# Patient Record
Sex: Female | Born: 1955 | Race: Black or African American | Hispanic: No | Marital: Married | Smoking: Never smoker
Health system: Southern US, Community
[De-identification: ages and names within clinical notes are randomized; demographics above are authoritative.]

## PROBLEM LIST (undated history)

## (undated) DIAGNOSIS — I10 Essential (primary) hypertension: Secondary | ICD-10-CM

## (undated) DIAGNOSIS — Z8619 Personal history of other infectious and parasitic diseases: Secondary | ICD-10-CM

## (undated) DIAGNOSIS — N189 Chronic kidney disease, unspecified: Secondary | ICD-10-CM

## (undated) DIAGNOSIS — E669 Obesity, unspecified: Secondary | ICD-10-CM

## (undated) DIAGNOSIS — E66811 Obesity, class 1: Secondary | ICD-10-CM

## (undated) DIAGNOSIS — H409 Unspecified glaucoma: Secondary | ICD-10-CM

## (undated) DIAGNOSIS — G5600 Carpal tunnel syndrome, unspecified upper limb: Secondary | ICD-10-CM

## (undated) DIAGNOSIS — K429 Umbilical hernia without obstruction or gangrene: Secondary | ICD-10-CM

## (undated) DIAGNOSIS — M5412 Radiculopathy, cervical region: Secondary | ICD-10-CM

## (undated) HISTORY — DX: Personal history of other infectious and parasitic diseases: Z86.19

## (undated) HISTORY — DX: Obesity, class 1: E66.811

## (undated) HISTORY — DX: Unspecified glaucoma: H40.9

## (undated) HISTORY — DX: Radiculopathy, cervical region: M54.12

## (undated) HISTORY — DX: Obesity, unspecified: E66.9

## (undated) HISTORY — DX: Carpal tunnel syndrome, unspecified upper limb: G56.00

## (undated) HISTORY — PX: GLAUCOMA SURGERY: SHX656

## (undated) HISTORY — PX: EYE SURGERY: SHX253

---

## 2012-11-09 HISTORY — PX: OTHER SURGICAL HISTORY: SHX169

## 2012-11-10 LAB — VITAMIN D 25 HYDROXY (VIT D DEFICIENCY, FRACTURES): Vit D, 25-Hydroxy: 25.2

## 2012-11-10 LAB — CBC: HGB: 11.5 g/dL

## 2012-11-10 LAB — TSH: TSH: 3.26 u[IU]/mL (ref 0.41–5.90)

## 2012-11-10 LAB — LIPID PANEL: LDL (calc): 130.4

## 2012-11-10 LAB — COMPREHENSIVE METABOLIC PANEL
Creat: 1.2
Glucose: 113
Potassium: 4.3 mmol/L

## 2012-11-30 ENCOUNTER — Ambulatory Visit: Payer: Self-pay | Admitting: Internal Medicine

## 2012-12-13 ENCOUNTER — Ambulatory Visit: Payer: Self-pay | Admitting: Internal Medicine

## 2012-12-18 ENCOUNTER — Emergency Department: Payer: Self-pay | Admitting: Emergency Medicine

## 2012-12-24 ENCOUNTER — Encounter: Payer: Self-pay | Admitting: Family Medicine

## 2012-12-24 ENCOUNTER — Ambulatory Visit (INDEPENDENT_AMBULATORY_CARE_PROVIDER_SITE_OTHER): Payer: BC Managed Care – PPO | Admitting: Family Medicine

## 2012-12-24 DIAGNOSIS — S63502A Unspecified sprain of left wrist, initial encounter: Secondary | ICD-10-CM

## 2012-12-24 DIAGNOSIS — M25569 Pain in unspecified knee: Secondary | ICD-10-CM | POA: Insufficient documentation

## 2012-12-24 DIAGNOSIS — S139XXA Sprain of joints and ligaments of unspecified parts of neck, initial encounter: Secondary | ICD-10-CM

## 2012-12-24 DIAGNOSIS — S63509A Unspecified sprain of unspecified wrist, initial encounter: Secondary | ICD-10-CM

## 2012-12-24 DIAGNOSIS — S161XXA Strain of muscle, fascia and tendon at neck level, initial encounter: Secondary | ICD-10-CM

## 2012-12-24 DIAGNOSIS — M25561 Pain in right knee: Secondary | ICD-10-CM

## 2012-12-24 DIAGNOSIS — M25562 Pain in left knee: Secondary | ICD-10-CM | POA: Insufficient documentation

## 2012-12-24 NOTE — Assessment & Plan Note (Addendum)
With multiple soft tissue contusions throughout body including right lower lip contusion. Reassured today.  No need for imaging today. Supportive care, continue ibuprofen, flexeril (cut in half given sedation with whole). Continue ice. Discussed anticipated course of resolution. I will mail cervical neck strain handout with exercises (forgot to provide in office today.) Pt declines stronger pain medication currently. Recommended stop use of cervical neck collar, may continue back brace which seems to be helping. If not improving as expected with above treatment, return to see me in 1 week.
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
## 2012-12-29 ENCOUNTER — Other Ambulatory Visit (HOSPITAL_COMMUNITY): Payer: Self-pay | Admitting: Chiropractic Medicine

## 2012-12-29 DIAGNOSIS — M5000 Cervical disc disorder with myelopathy, unspecified cervical region: Secondary | ICD-10-CM

## 2013-01-04 ENCOUNTER — Ambulatory Visit (HOSPITAL_COMMUNITY)
Admission: RE | Admit: 2013-01-04 | Discharge: 2013-01-04 | Disposition: A | Discharge status: Home / Self Care | Payer: BC Managed Care – PPO | Source: Ambulatory Visit | Attending: Chiropractic Medicine | Admitting: Chiropractic Medicine

## 2013-01-04 DIAGNOSIS — M47812 Spondylosis without myelopathy or radiculopathy, cervical region: Secondary | ICD-10-CM | POA: Insufficient documentation

## 2013-01-04 DIAGNOSIS — M4802 Spinal stenosis, cervical region: Secondary | ICD-10-CM | POA: Insufficient documentation

## 2013-01-04 DIAGNOSIS — M5090 Cervical disc disorder, unspecified, unspecified cervical region: Secondary | ICD-10-CM | POA: Insufficient documentation

## 2013-01-04 DIAGNOSIS — M5000 Cervical disc disorder with myelopathy, unspecified cervical region: Secondary | ICD-10-CM

## 2013-01-04 DIAGNOSIS — M5380 Other specified dorsopathies, site unspecified: Secondary | ICD-10-CM | POA: Insufficient documentation

## 2013-01-04 DIAGNOSIS — M5020 Other cervical disc displacement, unspecified cervical region: Secondary | ICD-10-CM | POA: Insufficient documentation

## 2013-01-04 DIAGNOSIS — M79609 Pain in unspecified limb: Secondary | ICD-10-CM | POA: Insufficient documentation

## 2013-01-06 ENCOUNTER — Encounter: Payer: Self-pay | Admitting: Family Medicine

## 2013-01-10 ENCOUNTER — Encounter: Payer: Self-pay | Admitting: Family Medicine

## 2013-01-12 ENCOUNTER — Encounter: Payer: Self-pay | Admitting: Family Medicine

## 2013-03-03 ENCOUNTER — Encounter: Payer: Self-pay | Admitting: Family Medicine

## 2013-03-12 HISTORY — PX: COLONOSCOPY: SHX174

## 2013-04-04 ENCOUNTER — Ambulatory Visit: Payer: Self-pay | Admitting: Unknown Physician Specialty

## 2013-04-04 LAB — HM COLONOSCOPY

## 2013-04-06 LAB — PATHOLOGY REPORT

## 2013-04-13 ENCOUNTER — Other Ambulatory Visit: Payer: Self-pay | Admitting: Orthopedic Surgery

## 2013-05-10 ENCOUNTER — Encounter (HOSPITAL_COMMUNITY): Payer: Self-pay

## 2013-05-17 ENCOUNTER — Encounter (HOSPITAL_COMMUNITY)
Admission: RE | Admit: 2013-05-17 | Discharge: 2013-05-17 | Disposition: A | Discharge status: Home / Self Care | Payer: 59 | Source: Ambulatory Visit | Attending: Orthopedic Surgery | Admitting: Orthopedic Surgery

## 2013-05-17 ENCOUNTER — Telehealth: Payer: Self-pay | Admitting: *Deleted

## 2013-05-17 ENCOUNTER — Encounter (HOSPITAL_COMMUNITY): Payer: Self-pay

## 2013-05-17 DIAGNOSIS — Z01812 Encounter for preprocedural laboratory examination: Secondary | ICD-10-CM | POA: Insufficient documentation

## 2013-05-17 DIAGNOSIS — M5000 Cervical disc disorder with myelopathy, unspecified cervical region: Secondary | ICD-10-CM

## 2013-05-17 DIAGNOSIS — Z01810 Encounter for preprocedural cardiovascular examination: Secondary | ICD-10-CM | POA: Insufficient documentation

## 2013-05-17 DIAGNOSIS — Z01811 Encounter for preprocedural respiratory examination: Secondary | ICD-10-CM | POA: Insufficient documentation

## 2013-05-17 DIAGNOSIS — Z01818 Encounter for other preprocedural examination: Secondary | ICD-10-CM | POA: Insufficient documentation

## 2013-05-17 DIAGNOSIS — M5010 Cervical disc disorder with radiculopathy, unspecified cervical region: Secondary | ICD-10-CM

## 2013-05-17 HISTORY — DX: Umbilical hernia without obstruction or gangrene: K42.9

## 2013-05-17 LAB — URINALYSIS, ROUTINE W REFLEX MICROSCOPIC
BILIRUBIN URINE: NEGATIVE
Glucose, UA: NEGATIVE mg/dL
Hgb urine dipstick: NEGATIVE
Ketones, ur: NEGATIVE mg/dL
LEUKOCYTES UA: NEGATIVE
Nitrite: NEGATIVE
Protein, ur: NEGATIVE mg/dL
Specific Gravity, Urine: 1.019 (ref 1.005–1.030)
UROBILINOGEN UA: 0.2 mg/dL (ref 0.0–1.0)
pH: 6 (ref 5.0–8.0)

## 2013-05-17 LAB — CBC WITH DIFFERENTIAL/PLATELET
BASOS PCT: 1 % (ref 0–1)
Basophils Absolute: 0 10*3/uL (ref 0.0–0.1)
Eosinophils Absolute: 0.3 10*3/uL (ref 0.0–0.7)
Eosinophils Relative: 5 % (ref 0–5)
HCT: 39.2 % (ref 36.0–46.0)
HEMOGLOBIN: 12.8 g/dL (ref 12.0–15.0)
LYMPHS ABS: 2.2 10*3/uL (ref 0.7–4.0)
Lymphocytes Relative: 37 % (ref 12–46)
MCH: 30.5 pg (ref 26.0–34.0)
MCHC: 32.7 g/dL (ref 30.0–36.0)
MCV: 93.3 fL (ref 78.0–100.0)
MONOS PCT: 8 % (ref 3–12)
Monocytes Absolute: 0.5 10*3/uL (ref 0.1–1.0)
NEUTROS ABS: 3 10*3/uL (ref 1.7–7.7)
Neutrophils Relative %: 50 % (ref 43–77)
Platelets: 271 10*3/uL (ref 150–400)
RBC: 4.2 MIL/uL (ref 3.87–5.11)
RDW: 13.5 % (ref 11.5–15.5)
WBC: 6 10*3/uL (ref 4.0–10.5)

## 2013-05-17 LAB — APTT: APTT: 29 s (ref 24–37)

## 2013-05-17 LAB — COMPREHENSIVE METABOLIC PANEL
ALBUMIN: 3.7 g/dL (ref 3.5–5.2)
ALK PHOS: 74 U/L (ref 39–117)
ALT: 14 U/L (ref 0–35)
AST: 15 U/L (ref 0–37)
BUN: 11 mg/dL (ref 6–23)
CHLORIDE: 100 meq/L (ref 96–112)
CO2: 27 mEq/L (ref 19–32)
Calcium: 9.6 mg/dL (ref 8.4–10.5)
Creatinine, Ser: 0.97 mg/dL (ref 0.50–1.10)
GFR calc Af Amer: 74 mL/min — ABNORMAL LOW (ref >90)
GFR calc non Af Amer: 64 mL/min — ABNORMAL LOW (ref >90)
Glucose, Bld: 72 mg/dL (ref 70–99)
POTASSIUM: 3.5 meq/L — AB (ref 3.7–5.3)
Sodium: 138 mEq/L (ref 137–147)
TOTAL PROTEIN: 8.6 g/dL — AB (ref 6.0–8.3)
Total Bilirubin: 0.2 mg/dL — ABNORMAL LOW (ref 0.3–1.2)

## 2013-05-17 LAB — PROTIME-INR
INR: 0.96 (ref 0.00–1.49)
Prothrombin Time: 12.6 seconds (ref 11.6–15.2)

## 2013-05-17 LAB — SURGICAL PCR SCREEN
MRSA, PCR: NEGATIVE
Staphylococcus aureus: NEGATIVE

## 2013-05-17 LAB — TYPE AND SCREEN
ABO/RH(D): O POS
ANTIBODY SCREEN: NEGATIVE

## 2013-05-17 LAB — ABO/RH: ABO/RH(D): O POS

## 2013-05-17 NOTE — Telephone Encounter (Signed)
Martha Wade from [facility] called in reference to patient. She is schedule for neck surgery on 05/25/13 with Dr. Lynann Bologna however her insurance changed on 05/12/13 and it now requires her to have a referral to see him from her PCP. Martha Wade was asking if you can do an electronic referral with [facility] or will you need to see the patient in the office prior to the referral. She understands the surgery may have to be rescheduled. She is to get back with me about the referral website if you are able to do it online vs seeing the patient in the office.

## 2013-05-17 NOTE — Telephone Encounter (Signed)
Ok to do referral without office visit.  Order placed

## 2013-05-17 NOTE — Pre-Procedure Instructions (Signed)
Martha Wade  05/17/2013   Your procedure is scheduled on:  Wednesday January 14 th at 0830 AM  Report to [facility] Short Stay Main Entrance "A" at 0630 AM.  Call this number if you have problems the morning of surgery: [phone]   Remember:   Do not eat food or drink liquids after midnight.   Take these medicines the morning of surgery with A SIP OF WATER: None Stop Vitamins, Herbal medication , Fish oil, and Nsaids (Ibuprofen, Advill, Aleve,  Naproxen ) 5 days prior to surgery.    Do not wear jewelry, make-up or nail polish.  Do not wear lotions, powders, or perfumes. You may wear deodorant.  Do not shave 48 hours prior to surgery.   Do not bring valuables to the hospital.  [facility] is not responsible for any belongings or valuables.               Contacts, dentures or bridgework may not be worn into surgery.  Leave suitcase in the car. After surgery it may be brought to your room.  For patients admitted to the hospital, discharge time is determined by your  treatment team.               Patients discharged the day of surgery will not be allowed to drive home.    Special Instructions: Incentive Spirometry - Practice and bring it with you on the day of surgery. Shower using CHG 2 nights before surgery and the night before surgery.  If you shower the day of surgery use CHG.  Use special wash - you have one bottle of CHG for all showers.  You should use approximately 1/3 of the bottle for each shower.   Please read over the following fact sheets that you were given: Pain Booklet, Coughing and Deep Breathing, MRSA Information and Surgical Site Infection Prevention

## 2013-05-18 NOTE — Telephone Encounter (Signed)
[place] notified. She said after she was on the phone with [facility] for an hour yesterday, she found out this referral is going to have to be done electronically through the [facility] website. Our regular referral isn't going to work. I tried to do this online and had to set up an account which requires approval. Angela Nevin is aware of this as well. She is going to reschedule patient's surgery a wait for the referral to be approved.

## 2013-05-19 NOTE — Progress Notes (Addendum)
Anesthesia Chart Review:  Patient is a 58 year old female scheduled for C4-5, C5-6, C6-7 on 05/25/13 by Dr. Lynann Bologna.  History includes non-smoker, glaucoma, umbilical hernia, obesity (BMI 35).  There is no reported history of HTN, but BP was recorded as 182/116 on arrival at PAT, but was 162/90 when rechecked.  PCP is Dr. Ria Bush, newly established 12/2012. (She was previously being followed at [facility] by Dr. Apolonio Schneiders.)  EKG on 05/17/13 showed NSR, possible LAE, LAD, RSR primae or QR pattern in V1 suggesting RV conduction delay, LVH, poor r wave progression, cannot exclude anterior infarct.  Currently, no comparison EKGs are available in [place], [place], [facility], or [facility].  No CV symptoms documented at her PAT visit.  No reported CAD/MI/CHF or DM history.  CXR on 05/17/13 showed: 1. Mildly enlarged cardiopericardial silhouette, without edema.  2. Thoracic spondylosis.   Preoperative labs noted.  Reviewed above with anesthesiologist Dr. Tamala Julian.  BP will be rechecked on arrival and patient further evaluated by her assigned anesthesiologist.  If BP is reasonable and no new CV symptoms then it is anticipated that she can proceed as planned.  [facility] Short Stay Center/Anesthesiology Phone [phone] 05/19/2013 5:35 PM

## 2013-05-22 NOTE — Telephone Encounter (Signed)
Noted. Martha Wade what do I need to do for this to be approved? Will route paperwork received to [facility].

## 2013-05-23 NOTE — Telephone Encounter (Signed)
Martha Wade has handled it. Thanks!

## 2013-06-03 ENCOUNTER — Encounter (HOSPITAL_COMMUNITY)
Admission: RE | Admit: 2013-06-03 | Discharge: 2013-06-03 | Disposition: A | Discharge status: Home / Self Care | Payer: 59 | Source: Ambulatory Visit | Attending: Orthopedic Surgery | Admitting: Orthopedic Surgery

## 2013-06-03 ENCOUNTER — Other Ambulatory Visit: Payer: Self-pay | Admitting: Orthopedic Surgery

## 2013-06-03 ENCOUNTER — Encounter (HOSPITAL_COMMUNITY): Payer: Self-pay

## 2013-06-03 DIAGNOSIS — Z01812 Encounter for preprocedural laboratory examination: Secondary | ICD-10-CM | POA: Insufficient documentation

## 2013-06-03 DIAGNOSIS — Z01818 Encounter for other preprocedural examination: Secondary | ICD-10-CM | POA: Insufficient documentation

## 2013-06-03 LAB — COMPREHENSIVE METABOLIC PANEL
ALK PHOS: 76 U/L (ref 39–117)
ALT: 17 U/L (ref 0–35)
AST: 16 U/L (ref 0–37)
Albumin: 3.5 g/dL (ref 3.5–5.2)
BUN: 11 mg/dL (ref 6–23)
CO2: 26 meq/L (ref 19–32)
Calcium: 9.4 mg/dL (ref 8.4–10.5)
Chloride: 105 mEq/L (ref 96–112)
Creatinine, Ser: 0.96 mg/dL (ref 0.50–1.10)
GFR calc Af Amer: 75 mL/min — ABNORMAL LOW (ref >90)
GFR, EST NON AFRICAN AMERICAN: 65 mL/min — AB (ref >90)
GLUCOSE: 106 mg/dL — AB (ref 70–99)
POTASSIUM: 3.7 meq/L (ref 3.7–5.3)
SODIUM: 144 meq/L (ref 137–147)
Total Bilirubin: <0.2 mg/dL — ABNORMAL LOW (ref 0.3–1.2)
Total Protein: 8.2 g/dL (ref 6.0–8.3)

## 2013-06-03 LAB — URINALYSIS, ROUTINE W REFLEX MICROSCOPIC
BILIRUBIN URINE: NEGATIVE
Glucose, UA: NEGATIVE mg/dL
HGB URINE DIPSTICK: NEGATIVE
Ketones, ur: NEGATIVE mg/dL
Leukocytes, UA: NEGATIVE
Nitrite: NEGATIVE
PROTEIN: NEGATIVE mg/dL
SPECIFIC GRAVITY, URINE: 1.015 (ref 1.005–1.030)
Urobilinogen, UA: 0.2 mg/dL (ref 0.0–1.0)
pH: 7.5 (ref 5.0–8.0)

## 2013-06-03 LAB — CBC WITH DIFFERENTIAL/PLATELET
BASOS ABS: 0 10*3/uL (ref 0.0–0.1)
Basophils Relative: 0 % (ref 0–1)
EOS PCT: 3 % (ref 0–5)
Eosinophils Absolute: 0.2 10*3/uL (ref 0.0–0.7)
HCT: 37.8 % (ref 36.0–46.0)
Hemoglobin: 12.5 g/dL (ref 12.0–15.0)
LYMPHS ABS: 2.3 10*3/uL (ref 0.7–4.0)
LYMPHS PCT: 32 % (ref 12–46)
MCH: 30.4 pg (ref 26.0–34.0)
MCHC: 33.1 g/dL (ref 30.0–36.0)
MCV: 92 fL (ref 78.0–100.0)
Monocytes Absolute: 0.6 10*3/uL (ref 0.1–1.0)
Monocytes Relative: 8 % (ref 3–12)
NEUTROS ABS: 4.2 10*3/uL (ref 1.7–7.7)
NEUTROS PCT: 57 % (ref 43–77)
Platelets: 252 10*3/uL (ref 150–400)
RBC: 4.11 MIL/uL (ref 3.87–5.11)
RDW: 13.3 % (ref 11.5–15.5)
WBC: 7.3 10*3/uL (ref 4.0–10.5)

## 2013-06-03 LAB — TYPE AND SCREEN
ABO/RH(D): O POS
Antibody Screen: NEGATIVE

## 2013-06-03 LAB — PROTIME-INR
INR: 0.97 (ref 0.00–1.49)
PROTHROMBIN TIME: 12.7 s (ref 11.6–15.2)

## 2013-06-03 LAB — APTT: aPTT: 30 seconds (ref 24–37)

## 2013-06-03 NOTE — Pre-Procedure Instructions (Signed)
Martha Wade  06/03/2013   Your procedure is scheduled on:  Wednesday, January 28  Report to [facility] Short Stay Main Entrance A at 0900 AM.  Call this number if you have problems the morning of surgery: [phone]   Remember:   Do not eat food or drink liquids after midnight.Tuesday night   Take these medicines the morning of surgery with A SIP OF WATER: none   Do not wear jewelry, make-up or nail polish.  Do not wear lotions, powders, or perfumes. You may wear deodorant.  Do not shave 48 hours prior to surgery. Men may shave face and neck.  Do not bring valuables to the hospital.  [facility] is not responsible for any belongings or valuables.               Contacts, dentures or bridgework may not be worn into surgery.  Leave suitcase in the car. After surgery it may be brought to your room.  For patients admitted to the hospital, discharge time is determined by your                treatment team.               Special Instructions: Shower using CHG 2 nights before surgery and the night before surgery.  If you shower the day of surgery use CHG.  Use special wash - you have one bottle of CHG for all showers.  You should use approximately 1/3 of the bottle for each shower.   Please read over the following fact sheets that you were given: Pain Booklet, Coughing and Deep Breathing, Blood Transfusion Information and Surgical Site Infection Prevention

## 2013-06-06 NOTE — Progress Notes (Signed)
Anesthesia follow-up:  See my note from 05/17/13. Surgery was rescheduled for 06/08/13 for unknown reasons. Repeat labs acceptable.  [facility] Short Stay Center/Anesthesiology Phone [phone] 06/06/2013 11:10 AM

## 2013-06-07 MED ORDER — POVIDONE-IODINE 7.5 % EX SOLN
Freq: Once | CUTANEOUS | Status: DC
  Filled 2013-06-07: qty 118

## 2013-06-07 MED ORDER — CEFAZOLIN SODIUM-DEXTROSE 2-3 GM-% IV SOLR: 2.0000 g | INTRAVENOUS | Status: DC

## 2013-06-07 MED ORDER — CEFAZOLIN SODIUM-DEXTROSE 2-3 GM-% IV SOLR
2.0000 g | INTRAVENOUS | Status: AC
  Administered 2013-06-08: 2 g via INTRAVENOUS
  Filled 2013-06-07: qty 50

## 2013-06-08 ENCOUNTER — Ambulatory Visit (HOSPITAL_COMMUNITY): Payer: 59 | Admitting: Critical Care Medicine

## 2013-06-08 ENCOUNTER — Encounter (HOSPITAL_COMMUNITY)
Admission: RE | Disposition: A | Discharge status: Home / Self Care | Payer: Self-pay | Source: Ambulatory Visit | Attending: Orthopedic Surgery

## 2013-06-08 ENCOUNTER — Encounter (HOSPITAL_COMMUNITY): Payer: 59 | Admitting: Vascular Surgery

## 2013-06-08 ENCOUNTER — Ambulatory Visit (HOSPITAL_COMMUNITY): Payer: 59

## 2013-06-08 ENCOUNTER — Inpatient Hospital Stay (HOSPITAL_COMMUNITY)
Admission: RE | Admit: 2013-06-08 | Discharge: 2013-06-09 | DRG: 473 | Disposition: A | Discharge status: Home / Self Care | Payer: 59 | Source: Ambulatory Visit | Attending: Orthopedic Surgery | Admitting: Orthopedic Surgery

## 2013-06-08 ENCOUNTER — Encounter (HOSPITAL_COMMUNITY): Payer: Self-pay | Admitting: *Deleted

## 2013-06-08 DIAGNOSIS — M4712 Other spondylosis with myelopathy, cervical region: Principal | ICD-10-CM | POA: Diagnosis present

## 2013-06-08 DIAGNOSIS — Z7982 Long term (current) use of aspirin: Secondary | ICD-10-CM

## 2013-06-08 DIAGNOSIS — G549 Nerve root and plexus disorder, unspecified: Secondary | ICD-10-CM | POA: Diagnosis present

## 2013-06-08 HISTORY — PX: ANTERIOR CERVICAL DECOMP/DISCECTOMY FUSION: SHX1161

## 2013-06-08 SURGERY — ANTERIOR CERVICAL DECOMPRESSION/DISCECTOMY FUSION 3 LEVELS
Anesthesia: General | Site: Spine Cervical

## 2013-06-08 MED ORDER — SENNOSIDES-DOCUSATE SODIUM 8.6-50 MG PO TABS: 1.0000 | ORAL_TABLET | Freq: Every evening | ORAL | Status: DC | PRN

## 2013-06-08 MED ORDER — PHENOL 1.4 % MT LIQD: 1.0000 | OROMUCOSAL | Status: DC | PRN

## 2013-06-08 MED ORDER — OXYCODONE-ACETAMINOPHEN 5-325 MG PO TABS
1.0000 | ORAL_TABLET | ORAL | Status: DC | PRN
  Administered 2013-06-08 – 2013-06-09 (×3): 2 via ORAL
  Filled 2013-06-08 (×2): qty 2

## 2013-06-08 MED ORDER — DIAZEPAM 5 MG PO TABS
ORAL_TABLET | ORAL | Status: AC
  Administered 2013-06-08: 5 mg via ORAL
  Filled 2013-06-08: qty 1

## 2013-06-08 MED ORDER — MENTHOL 3 MG MT LOZG
LOZENGE | OROMUCOSAL | Status: AC
  Administered 2013-06-08: 1 via ORAL
  Filled 2013-06-08: qty 9

## 2013-06-08 MED ORDER — TRAVOPROST 0.004 % OP SOLN
1.0000 [drp] | Freq: Every day | OPHTHALMIC | Status: DC
Start: 2013-06-08 — End: 2013-06-09
  Administered 2013-06-08: 1 [drp] via OPHTHALMIC
  Filled 2013-06-08: qty 2.5

## 2013-06-08 MED ORDER — SODIUM CHLORIDE 0.9 % IJ SOLN: 3.0000 mL | INTRAMUSCULAR | Status: DC | PRN

## 2013-06-08 MED ORDER — NEOSTIGMINE METHYLSULFATE 1 MG/ML IJ SOLN
INTRAMUSCULAR | Status: DC | PRN
  Administered 2013-06-08: 5 mg via INTRAVENOUS

## 2013-06-08 MED ORDER — MIDAZOLAM HCL 5 MG/5ML IJ SOLN
INTRAMUSCULAR | Status: DC | PRN
  Administered 2013-06-08: 2 mg via INTRAVENOUS

## 2013-06-08 MED ORDER — LATANOPROST 0.005 % OP SOLN
1.0000 [drp] | Freq: Every day | OPHTHALMIC | Status: DC
  Administered 2013-06-08: 1 [drp] via OPHTHALMIC
  Filled 2013-06-08: qty 2.5

## 2013-06-08 MED ORDER — FENTANYL CITRATE 0.05 MG/ML IJ SOLN
INTRAMUSCULAR | Status: AC
  Filled 2013-06-08: qty 5

## 2013-06-08 MED ORDER — ONDANSETRON HCL 4 MG/2ML IJ SOLN
INTRAMUSCULAR | Status: AC
  Filled 2013-06-08: qty 2

## 2013-06-08 MED ORDER — LIDOCAINE HCL (CARDIAC) 20 MG/ML IV SOLN
INTRAVENOUS | Status: AC
  Filled 2013-06-08: qty 5

## 2013-06-08 MED ORDER — BUPIVACAINE-EPINEPHRINE (PF) 0.25% -1:200000 IJ SOLN
INTRAMUSCULAR | Status: AC
  Filled 2013-06-08: qty 30

## 2013-06-08 MED ORDER — GLYCOPYRROLATE 0.2 MG/ML IJ SOLN
INTRAMUSCULAR | Status: DC | PRN
  Administered 2013-06-08: .8 mg via INTRAVENOUS

## 2013-06-08 MED ORDER — ACETAMINOPHEN 160 MG/5ML PO SOLN: 325.0000 mg | ORAL | Status: DC | PRN

## 2013-06-08 MED ORDER — ACETAMINOPHEN 325 MG PO TABS: 650.0000 mg | ORAL_TABLET | ORAL | Status: DC | PRN

## 2013-06-08 MED ORDER — CEFAZOLIN SODIUM 1-5 GM-% IV SOLN
1.0000 g | Freq: Three times a day (TID) | INTRAVENOUS | Status: AC
  Administered 2013-06-08 – 2013-06-09 (×2): 1 g via INTRAVENOUS
  Filled 2013-06-08 (×2): qty 50

## 2013-06-08 MED ORDER — ZOLPIDEM TARTRATE 5 MG PO TABS
5.0000 mg | ORAL_TABLET | Freq: Every evening | ORAL | Status: DC | PRN
Start: 2013-06-08 — End: 2013-06-09

## 2013-06-08 MED ORDER — BRINZOLAMIDE 1 % OP SUSP
1.0000 [drp] | Freq: Three times a day (TID) | OPHTHALMIC | Status: DC
  Administered 2013-06-08: 1 [drp] via OPHTHALMIC
  Filled 2013-06-08: qty 10

## 2013-06-08 MED ORDER — ACETAMINOPHEN 650 MG RE SUPP
650.0000 mg | RECTAL | Status: DC | PRN
Start: 2013-06-08 — End: 2013-06-09

## 2013-06-08 MED ORDER — PROPOFOL 10 MG/ML IV BOLUS
INTRAVENOUS | Status: AC
  Filled 2013-06-08: qty 20

## 2013-06-08 MED ORDER — FLEET ENEMA 7-19 GM/118ML RE ENEM: 1.0000 | ENEMA | Freq: Once | RECTAL | Status: AC | PRN

## 2013-06-08 MED ORDER — SODIUM CHLORIDE 0.9 % IJ SOLN
3.0000 mL | Freq: Two times a day (BID) | INTRAMUSCULAR | Status: DC
  Administered 2013-06-08: 3 mL via INTRAVENOUS

## 2013-06-08 MED ORDER — SUCCINYLCHOLINE CHLORIDE 20 MG/ML IJ SOLN
INTRAMUSCULAR | Status: AC
  Filled 2013-06-08: qty 1

## 2013-06-08 MED ORDER — ROCURONIUM BROMIDE 50 MG/5ML IV SOLN
INTRAVENOUS | Status: AC
  Filled 2013-06-08: qty 1

## 2013-06-08 MED ORDER — CYANOCOBALAMIN 500 MCG PO TABS
500.0000 ug | ORAL_TABLET | Freq: Every day | ORAL | Status: DC
  Administered 2013-06-08: 500 ug via ORAL
  Filled 2013-06-08 (×2): qty 1

## 2013-06-08 MED ORDER — VITAMIN D3 25 MCG (1000 UNIT) PO TABS
1000.0000 [IU] | ORAL_TABLET | Freq: Every day | ORAL | Status: DC
  Administered 2013-06-08: 1000 [IU] via ORAL
  Filled 2013-06-08 (×2): qty 1

## 2013-06-08 MED ORDER — MIDAZOLAM HCL 2 MG/2ML IJ SOLN
INTRAMUSCULAR | Status: AC
Start: 2013-06-08 — End: 2013-06-08
  Filled 2013-06-08: qty 2

## 2013-06-08 MED ORDER — OXYCODONE HCL 5 MG/5ML PO SOLN: 5.0000 mg | Freq: Once | ORAL | Status: DC | PRN

## 2013-06-08 MED ORDER — BISACODYL 5 MG PO TBEC: 5.0000 mg | DELAYED_RELEASE_TABLET | Freq: Every day | ORAL | Status: DC | PRN

## 2013-06-08 MED ORDER — BUPIVACAINE-EPINEPHRINE 0.25% -1:200000 IJ SOLN
INTRAMUSCULAR | Status: DC | PRN
  Administered 2013-06-08: 3 mL

## 2013-06-08 MED ORDER — FENTANYL CITRATE 0.05 MG/ML IJ SOLN
INTRAMUSCULAR | Status: AC
Start: 2013-06-08 — End: 2013-06-08
  Filled 2013-06-08: qty 5

## 2013-06-08 MED ORDER — ROCURONIUM BROMIDE 100 MG/10ML IV SOLN
INTRAVENOUS | Status: DC | PRN
  Administered 2013-06-08: 50 mg via INTRAVENOUS
  Administered 2013-06-08 (×4): 10 mg via INTRAVENOUS

## 2013-06-08 MED ORDER — LACTATED RINGERS IV SOLN
INTRAVENOUS | Status: DC
Start: 2013-06-08 — End: 2013-06-08
  Administered 2013-06-08 (×3): via INTRAVENOUS

## 2013-06-08 MED ORDER — HYDROMORPHONE HCL PF 1 MG/ML IJ SOLN
INTRAMUSCULAR | Status: AC
  Administered 2013-06-08: 0.5 mg via INTRAVENOUS
  Filled 2013-06-08: qty 1

## 2013-06-08 MED ORDER — GLYCOPYRROLATE 0.2 MG/ML IJ SOLN
INTRAMUSCULAR | Status: AC
  Filled 2013-06-08: qty 4

## 2013-06-08 MED ORDER — MENTHOL 3 MG MT LOZG
1.0000 | LOZENGE | OROMUCOSAL | Status: DC | PRN
  Administered 2013-06-08: 1 via ORAL

## 2013-06-08 MED ORDER — 0.9 % SODIUM CHLORIDE (POUR BTL) OPTIME
TOPICAL | Status: DC | PRN
  Administered 2013-06-08: 1000 mL

## 2013-06-08 MED ORDER — SODIUM CHLORIDE 0.9 % IV SOLN
250.0000 mL | INTRAVENOUS | Status: DC
  Administered 2013-06-08: 20 mL via INTRAVENOUS

## 2013-06-08 MED ORDER — PHENYLEPHRINE HCL 10 MG/ML IJ SOLN
INTRAMUSCULAR | Status: DC | PRN
  Administered 2013-06-08 (×2): 80 ug via INTRAVENOUS
  Administered 2013-06-08: 120 ug via INTRAVENOUS
  Administered 2013-06-08: 40 ug via INTRAVENOUS
  Administered 2013-06-08: 80 ug via INTRAVENOUS
  Administered 2013-06-08: 120 ug via INTRAVENOUS
  Administered 2013-06-08: 40 ug via INTRAVENOUS

## 2013-06-08 MED ORDER — ROCURONIUM BROMIDE 50 MG/5ML IV SOLN
INTRAVENOUS | Status: AC
Start: 2013-06-08 — End: 2013-06-08
  Filled 2013-06-08: qty 1

## 2013-06-08 MED ORDER — HYDROMORPHONE HCL PF 1 MG/ML IJ SOLN
0.2500 mg | INTRAMUSCULAR | Status: DC | PRN
  Administered 2013-06-08 (×2): 0.5 mg via INTRAVENOUS

## 2013-06-08 MED ORDER — OXYCODONE HCL 5 MG PO TABS: 5.0000 mg | ORAL_TABLET | Freq: Once | ORAL | Status: DC | PRN

## 2013-06-08 MED ORDER — FENTANYL CITRATE 0.05 MG/ML IJ SOLN
INTRAMUSCULAR | Status: DC | PRN
  Administered 2013-06-08: 50 ug via INTRAVENOUS
  Administered 2013-06-08: 100 ug via INTRAVENOUS
  Administered 2013-06-08: 50 ug via INTRAVENOUS
  Administered 2013-06-08: 150 ug via INTRAVENOUS
  Administered 2013-06-08: 100 ug via INTRAVENOUS
  Administered 2013-06-08: 50 ug via INTRAVENOUS

## 2013-06-08 MED ORDER — DIAZEPAM 5 MG PO TABS
5.0000 mg | ORAL_TABLET | Freq: Four times a day (QID) | ORAL | Status: DC | PRN
Start: 2013-06-08 — End: 2013-06-09
  Administered 2013-06-08: 5 mg via ORAL

## 2013-06-08 MED ORDER — NEOSTIGMINE METHYLSULFATE 1 MG/ML IJ SOLN
INTRAMUSCULAR | Status: AC
  Filled 2013-06-08: qty 10

## 2013-06-08 MED ORDER — ARTIFICIAL TEARS OP OINT
TOPICAL_OINTMENT | OPHTHALMIC | Status: DC | PRN
  Administered 2013-06-08: 1 via OPHTHALMIC

## 2013-06-08 MED ORDER — PHENYLEPHRINE 40 MCG/ML (10ML) SYRINGE FOR IV PUSH (FOR BLOOD PRESSURE SUPPORT)
PREFILLED_SYRINGE | INTRAVENOUS | Status: AC
  Filled 2013-06-08: qty 10

## 2013-06-08 MED ORDER — ONDANSETRON HCL 4 MG/2ML IJ SOLN
INTRAMUSCULAR | Status: DC | PRN
  Administered 2013-06-08: 4 mg via INTRAVENOUS

## 2013-06-08 MED ORDER — ONDANSETRON HCL 4 MG/2ML IJ SOLN: 4.0000 mg | Freq: Once | INTRAMUSCULAR | Status: DC | PRN

## 2013-06-08 MED ORDER — MORPHINE SULFATE 2 MG/ML IJ SOLN: 1.0000 mg | INTRAMUSCULAR | Status: DC | PRN

## 2013-06-08 MED ORDER — THROMBIN 20000 UNITS EX SOLR
CUTANEOUS | Status: DC | PRN
  Administered 2013-06-08: 13:00:00

## 2013-06-08 MED ORDER — PROPOFOL 10 MG/ML IV BOLUS
INTRAVENOUS | Status: DC | PRN
  Administered 2013-06-08: 160 mg via INTRAVENOUS

## 2013-06-08 MED ORDER — LIDOCAINE HCL (CARDIAC) 20 MG/ML IV SOLN
INTRAVENOUS | Status: DC | PRN
  Administered 2013-06-08: 80 mg via INTRAVENOUS

## 2013-06-08 MED ORDER — ACETAMINOPHEN 325 MG PO TABS: 325.0000 mg | ORAL_TABLET | ORAL | Status: DC | PRN

## 2013-06-08 MED ORDER — ALUM & MAG HYDROXIDE-SIMETH 200-200-20 MG/5ML PO SUSP: 30.0000 mL | Freq: Four times a day (QID) | ORAL | Status: DC | PRN

## 2013-06-08 MED ORDER — ONDANSETRON HCL 4 MG/2ML IJ SOLN: 4.0000 mg | INTRAMUSCULAR | Status: DC | PRN

## 2013-06-08 MED ORDER — DOCUSATE SODIUM 100 MG PO CAPS
100.0000 mg | ORAL_CAPSULE | Freq: Two times a day (BID) | ORAL | Status: DC
  Administered 2013-06-08: 100 mg via ORAL
  Filled 2013-06-08: qty 1

## 2013-06-08 MED ORDER — OXYCODONE-ACETAMINOPHEN 5-325 MG PO TABS
ORAL_TABLET | ORAL | Status: AC
  Administered 2013-06-08: 2 via ORAL
  Filled 2013-06-08: qty 2

## 2013-06-08 SURGICAL SUPPLY — 74 items
BENZOIN TINCTURE PRP APPL 2/3 (GAUZE/BANDAGES/DRESSINGS) ×3 IMPLANT
BIT DRILL NEURO 2X3.1 SFT TUCH (MISCELLANEOUS) ×1 IMPLANT
BIT DRILL SRG 14X2.2XFLT CHK (BIT) ×1 IMPLANT
BIT DRL SRG 14X2.2XFLT CHK (BIT) ×1
BLADE SURG 15 STRL LF DISP TIS (BLADE) ×1 IMPLANT
BLADE SURG 15 STRL SS (BLADE) ×2
BLADE SURG ROTATE 9660 (MISCELLANEOUS) ×3 IMPLANT
BUR MATCHSTICK NEURO 3.0 LAGG (BURR) IMPLANT
CARTRIDGE OIL MAESTRO DRILL (MISCELLANEOUS) ×1 IMPLANT
CLOSURE WOUND 1/2 X4 (GAUZE/BANDAGES/DRESSINGS) ×1
CLOTH BEACON ORANGE TIMEOUT ST (SAFETY) ×3 IMPLANT
CORDS BIPOLAR (ELECTRODE) ×3 IMPLANT
COVER SURGICAL LIGHT HANDLE (MISCELLANEOUS) ×3 IMPLANT
CRADLE DONUT ADULT HEAD (MISCELLANEOUS) ×3 IMPLANT
DIFFUSER DRILL AIR PNEUMATIC (MISCELLANEOUS) ×3 IMPLANT
DRAIN JACKSON RD 7FR 3/32 (WOUND CARE) IMPLANT
DRAPE C-ARM 42X72 X-RAY (DRAPES) ×3 IMPLANT
DRAPE POUCH INSTRU U-SHP 10X18 (DRAPES) ×3 IMPLANT
DRAPE SURG 17X23 STRL (DRAPES) ×9 IMPLANT
DRILL BIT SKYLINE 14MM (BIT) ×2
DRILL NEURO 2X3.1 SOFT TOUCH (MISCELLANEOUS) ×3
DURAPREP 26ML APPLICATOR (WOUND CARE) ×3 IMPLANT
ELECT COATED BLADE 2.86 ST (ELECTRODE) ×3 IMPLANT
ELECT REM PT RETURN 9FT ADLT (ELECTROSURGICAL) ×3
ELECTRODE REM PT RTRN 9FT ADLT (ELECTROSURGICAL) ×1 IMPLANT
ENDOSKELETON IMPLANT MED 6M-0 (Orthopedic Implant) ×3 IMPLANT
EVACUATOR SILICONE 100CC (DRAIN) IMPLANT
GAUZE SPONGE 4X4 16PLY XRAY LF (GAUZE/BANDAGES/DRESSINGS) ×3 IMPLANT
GLOVE BIO SURGEON STRL SZ7 (GLOVE) ×3 IMPLANT
GLOVE BIO SURGEON STRL SZ8 (GLOVE) ×3 IMPLANT
GLOVE BIOGEL PI IND STRL 7.0 (GLOVE) ×2 IMPLANT
GLOVE BIOGEL PI IND STRL 8 (GLOVE) ×1 IMPLANT
GLOVE BIOGEL PI INDICATOR 7.0 (GLOVE) ×4
GLOVE BIOGEL PI INDICATOR 8 (GLOVE) ×2
GOWN STRL NON-REIN LRG LVL3 (GOWN DISPOSABLE) ×3 IMPLANT
GOWN STRL REIN XL XLG (GOWN DISPOSABLE) ×3 IMPLANT
INTERLOCK LRDTC CRVCL VBR 6MM (Bone Implant) ×2 IMPLANT
IV CATH 14GX2 1/4 (CATHETERS) ×3 IMPLANT
KIT BASIN OR (CUSTOM PROCEDURE TRAY) ×3 IMPLANT
KIT ROOM TURNOVER OR (KITS) ×3 IMPLANT
LORDOTIC CERVICAL VBR 6MM SM (Bone Implant) ×6 IMPLANT
MANIFOLD NEPTUNE II (INSTRUMENTS) ×3 IMPLANT
NEEDLE 27GAX1X1/2 (NEEDLE) ×3 IMPLANT
NEEDLE SPNL 20GX3.5 QUINCKE YW (NEEDLE) ×3 IMPLANT
NS IRRIG 1000ML POUR BTL (IV SOLUTION) ×3 IMPLANT
OIL CARTRIDGE MAESTRO DRILL (MISCELLANEOUS) ×3
PACK ORTHO CERVICAL (CUSTOM PROCEDURE TRAY) ×3 IMPLANT
PAD ARMBOARD 7.5X6 YLW CONV (MISCELLANEOUS) ×6 IMPLANT
PATTIES SURGICAL .5 X.5 (GAUZE/BANDAGES/DRESSINGS) ×3 IMPLANT
PATTIES SURGICAL .5 X1 (DISPOSABLE) ×3 IMPLANT
PIN DISTRACTION 14 (PIN) ×6 IMPLANT
PLATE SKYLINE THREE LEVEL 51MM (Plate) ×3 IMPLANT
PUTTY BONE DBX 5CC MIX (Putty) ×3 IMPLANT
SCREW VAR SELF TAP SKYLINE 14M (Screw) ×24 IMPLANT
SPONGE GAUZE 4X4 12PLY (GAUZE/BANDAGES/DRESSINGS) ×3 IMPLANT
SPONGE GAUZE 4X4 12PLY STER LF (GAUZE/BANDAGES/DRESSINGS) ×3 IMPLANT
SPONGE INTESTINAL PEANUT (DISPOSABLE) ×3 IMPLANT
SPONGE SURGIFOAM ABS GEL 100 (HEMOSTASIS) ×3 IMPLANT
STRIP CLOSURE SKIN 1/2X4 (GAUZE/BANDAGES/DRESSINGS) ×2 IMPLANT
SURGIFLO TRUKIT (HEMOSTASIS) IMPLANT
SUT MNCRL AB 4-0 PS2 18 (SUTURE) ×3 IMPLANT
SUT SILK 4 0 (SUTURE)
SUT SILK 4-0 18XBRD TIE 12 (SUTURE) IMPLANT
SUT VIC AB 2-0 CT2 18 VCP726D (SUTURE) ×3 IMPLANT
SYR BULB IRRIGATION 50ML (SYRINGE) ×3 IMPLANT
SYR CONTROL 10ML LL (SYRINGE) ×3 IMPLANT
TAPE CLOTH 4X10 WHT NS (GAUZE/BANDAGES/DRESSINGS) ×3 IMPLANT
TAPE CLOTH SURG 4X10 WHT LF (GAUZE/BANDAGES/DRESSINGS) ×3 IMPLANT
TAPE UMBILICAL COTTON 1/8X30 (MISCELLANEOUS) ×6 IMPLANT
TOWEL OR 17X24 6PK STRL BLUE (TOWEL DISPOSABLE) ×3 IMPLANT
TOWEL OR 17X26 10 PK STRL BLUE (TOWEL DISPOSABLE) ×3 IMPLANT
TRAY FOLEY CATH 16FRSI W/METER (SET/KITS/TRAYS/PACK) ×3 IMPLANT
WATER STERILE IRR 1000ML POUR (IV SOLUTION) IMPLANT
YANKAUER SUCT BULB TIP NO VENT (SUCTIONS) ×3 IMPLANT

## 2013-06-08 NOTE — H&P (Signed)
PREOPERATIVE H&P  Chief Complaint: bilateral arm pain  HPI: Martha Wade is a 58 y.o. female who presents with ongoing bilateral arm pain. MRI = varying degrees of spinal cord compression and NF compression C4-C7. Patient failed conservative care and did elect to proceed with a C4-7 ACDF.  Past Medical History  Diagnosis Date  . Glaucoma     early stages (Dr. Wallace Going and Dr. Gloriann Loan)  . CTS (carpal tunnel syndrome)     bilateral  . Cervical radiculopathy     [place]  . Umbilical hernia    Past Surgical History  Procedure Laterality Date  . Dexa  11/2012    WNL  . Colonoscopy w/ biopsies and polypectomy    . Eye surgery Left     laser   History   Social History  . Marital Status: Married    Spouse Name: N/A    Number of Children: N/A  . Years of Education: N/A   Social History Main Topics  . Smoking status: Never Smoker   . Smokeless tobacco: Never Used  . Alcohol Use: No  . Drug Use: No  . Sexual Activity: Not Currently   Other Topics Concern  . Not on file   Social History Narrative   Lives with [name]   Married   Occupation: dispatcher   Edu: college   Family History  Problem Relation Age of Onset  . Glaucoma Mother   . CAD Mother   . Diabetes Mother   . Cancer Father     unsure type  . Hypertension Brother     and sister  . Stroke Mother    Allergies  Allergen Reactions  . Aleve [Naproxen Sodium] Hives, Itching and Swelling  . Citrus Swelling   Prior to Admission medications   Medication Sig Start Date End Date Taking? Authorizing Provider  Aspirin-Caffeine (BACK & BODY EXTRA STRENGTH PO) Take 1 tablet by mouth at bedtime as needed (pain).   Yes Historical Provider, MD  bimatoprost (LUMIGAN) 0.03 % ophthalmic solution Place 1 drop into both eyes 2 (two) times daily.   Yes Historical Provider, MD  brinzolamide (AZOPT) 1 % ophthalmic suspension Place 1 drop into both eyes 3 (three) times daily.   Yes Historical Provider, MD   cholecalciferol (VITAMIN D) 1000 UNITS tablet Take 1,000 Units by mouth daily.   Yes Historical Provider, MD  Coenzyme Q10 (COQ-10) 100 MG CAPS Take 100 mg by mouth daily.   Yes Historical Provider, MD  cyanocobalamin 500 MCG tablet Take 500 mcg by mouth daily.    Yes Historical Provider, MD  cyclobenzaprine (FLEXERIL) 10 MG tablet Take 10 mg by mouth at bedtime as needed for muscle spasms.    Yes Historical Provider, MD  fish oil-omega-3 fatty acids 1000 MG capsule Take 1 g by mouth daily.    Yes Historical Provider, MD  travoprost, benzalkonium, (TRAVATAN) 0.004 % ophthalmic solution Place 1 drop into both eyes at bedtime.   Yes Historical Provider, MD     All other systems have been reviewed and were otherwise negative with the exception of those mentioned in the HPI and as above.  Physical Exam:  General: Alert, no acute distress Cardiovascular: No pedal edema Respiratory: No cyanosis, no use of accessory musculature Skin: No lesions in the area of chief complaint Neurologic: Sensation intact distally Psychiatric: Patient is competent for consent with normal mood and affect Lymphatic: No axillary or cervical lymphadenopathy   Assessment/Plan: Bilateral arm pain Plan for Procedure(s): ANTERIOR CERVICAL DECOMPRESSION/DISCECTOMY  FUSION 3 LEVELS, C4-C7   Sinclair Ship, MD 06/08/2013 8:20 AM

## 2013-06-08 NOTE — Anesthesia Preprocedure Evaluation (Addendum)
Anesthesia Evaluation  Patient identified by MRN, date of birth, ID band Patient awake    Airway Mallampati: II TM Distance: >3 FB Neck ROM: Limited    Dental  (+) Dental Advisory Given, Teeth Intact, Poor Dentition, Chipped and Missing,    Pulmonary          Cardiovascular hypertension, Rhythm:Regular     Neuro/Psych  Neuromuscular disease    GI/Hepatic   Endo/Other  Morbid obesity  Renal/GU      Musculoskeletal   Abdominal   Peds  Hematology   Anesthesia Other Findings   Reproductive/Obstetrics                         Anesthesia Physical Anesthesia Plan  ASA: II  Anesthesia Plan: General   Post-op Pain Management:    Induction: Intravenous  Airway Management Planned: Oral ETT  Additional Equipment:   Intra-op Plan:   Post-operative Plan: Extubation in OR  Informed Consent: I have reviewed the patients History and Physical, chart, labs and discussed the procedure including the risks, benefits and alternatives for the proposed anesthesia with the patient or authorized representative who has indicated his/her understanding and acceptance.   Dental advisory given  Plan Discussed with:   Anesthesia Plan Comments:         Anesthesia Quick Evaluation

## 2013-06-08 NOTE — Progress Notes (Signed)
Pt arrived to floor from PACU,slightly drowsy but oriented x 4,c/o of mild pain,earlier medicated in PACU,pt settled in bed,call light within pt's reach,v/s stable,will continue to monitor. Obasogie-Asidi, Rayetta Veith Efe

## 2013-06-08 NOTE — Anesthesia Postprocedure Evaluation (Signed)
Anesthesia Post Note  Patient: Martha Wade  Procedure(s) Performed: Procedure(s) (LRB): ANTERIOR CERVICAL DECOMPRESSION/DISCECTOMY FUSION 3 LEVELS (N/A)  Anesthesia type: General  Patient location: PACU  Post pain: Pain level controlled and Adequate analgesia  Post assessment: Post-op Vital signs reviewed, Patient's Cardiovascular Status Stable, Respiratory Function Stable, Patent Airway and Pain level controlled  Last Vitals:  Filed Vitals:   06/08/13 1745  BP: 151/80  Pulse: 87  Temp: 36.9 C  Resp: 14    Post vital signs: Reviewed and stable  Level of consciousness: awake, alert  and oriented  Complications: No apparent anesthesia complications

## 2013-06-08 NOTE — Anesthesia Procedure Notes (Signed)
Procedure Name: Intubation Date/Time: 06/08/2013 12:28 PM Performed by: Sampson Si E Pre-anesthesia Checklist: Patient identified, Emergency Drugs available, Suction available, Patient being monitored and Timeout performed Patient Re-evaluated:Patient Re-evaluated prior to inductionOxygen Delivery Method: Circle system utilized Preoxygenation: Pre-oxygenation with 100% oxygen Intubation Type: IV induction Ventilation: Mask ventilation without difficulty Laryngoscope Size: Mac and 3 Grade View: Grade I Tube type: Oral Tube size: 7.5 mm Number of attempts: 1 Airway Equipment and Method: Stylet Placement Confirmation: ETT inserted through vocal cords under direct vision,  positive ETCO2 and breath sounds checked- equal and bilateral Secured at: 22 cm Tube secured with: Tape Dental Injury: Teeth and Oropharynx as per pre-operative assessment

## 2013-06-08 NOTE — Transfer of Care (Signed)
Immediate Anesthesia Transfer of Care Note  Patient: Martha Wade  Procedure(s) Performed: Procedure(s) with comments: ANTERIOR CERVICAL DECOMPRESSION/DISCECTOMY FUSION 3 LEVELS (N/A) - Anterior cervical decompression fusion with instrumentation and allograft  Patient Location: PACU  Anesthesia Type:General  Level of Consciousness: awake  Airway & Oxygen Therapy: Patient Spontanous Breathing and Patient connected to nasal cannula oxygen  Post-op Assessment: Report given to PACU RN and Patient moving all extremities X 4  Post vital signs: Reviewed and stable  Complications: No apparent anesthesia complications

## 2013-06-08 NOTE — Preoperative (Signed)
Beta Blockers   Reason not to administer Beta Blockers:Not Applicable 

## 2013-06-09 ENCOUNTER — Encounter: Payer: Self-pay | Admitting: Family Medicine
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
## 2013-06-09 NOTE — Progress Notes (Signed)
Orthopedic Tech Progress Note Patient Details:  Martha Wade [birth year] [id number]  Ortho Devices Type of Ortho Device: [place] cervical collar Ortho Device/Splint Interventions: Application   Cammer, Theodoro Parma 06/09/2013, 9:15 AM

## 2013-06-09 NOTE — Progress Notes (Signed)
Patient reports resolution of bilateral arm pain. Slept well last night. Has been able to tolerate liquids.  BP 130/85  Pulse 89  Temp(Src) 98 F (36.7 C) (Oral)  Resp 18  Ht 5\' 5"  (1.651 m)  Wt 214 lb 15.2 oz (97.5 kg)  BMI 35.77 kg/m2  SpO2 100%  LMP 05/25/2013  Collar is fitting patient appropriately NVI Dressing in place  POD #1 after C4-6 acdf with resolution of bilateral arm pain  - d/c home today - f/u in 2 weeks - philly collar for showering - precautions discussed with patient

## 2013-06-09 NOTE — Plan of Care (Signed)
D/c instructions given to pt and pt family. No questions. V/u. Med script given to pt. Pt is stable to be d/c.

## 2013-06-23 NOTE — Discharge Summary (Signed)
Patient ID: Martha Wade MRN: [id number] DOB/AGE: 1956/03/05 58 y.o.  Admit date: 06/08/2013 Discharge date: 06/09/2012  Admission Diagnoses:  Active Problems:   Myeloradiculopathy   Discharge Diagnoses:  Same  Past Medical History  Diagnosis Date  . Glaucoma     early stages (Dr. Wallace Going and Dr. Gloriann Loan)  . CTS (carpal tunnel syndrome)     bilateral  . Cervical radiculopathy     [place]  . Umbilical hernia     Surgeries: Procedure(s): ANTERIOR CERVICAL DECOMPRESSION/DISCECTOMY FUSION 3 LEVELS on 06/08/2013   Consultants:    Discharged Condition: Improved  Hospital Course: Martha Wade is an 58 y.o. female who was admitted 06/08/2013 for operative treatment of<principal problem not specified>. Patient has severe unremitting pain that affects sleep, daily activities, and work/hobbies. After pre-op clearance the patient was taken to the operating room on 06/08/2013 and underwent  Procedure(s): ANTERIOR CERVICAL DECOMPRESSION/DISCECTOMY FUSION 3 LEVELS C4-7.    Patient was given perioperative antibiotics:  Anti-infectives   Start     Dose/Rate Route Frequency Ordered Stop   06/08/13 2130  ceFAZolin (ANCEF) IVPB 1 g/50 mL premix     1 g 100 mL/hr over 30 Minutes Intravenous Every 8 hours 06/08/13 2116 06/09/13 0632   06/08/13 0600  ceFAZolin (ANCEF) IVPB 2 g/50 mL premix  Status:  Discontinued     2 g 100 mL/hr over 30 Minutes Intravenous On call to O.R. 06/07/13 1419 06/07/13 1504   06/08/13 0600  ceFAZolin (ANCEF) IVPB 2 g/50 mL premix     2 g 100 mL/hr over 30 Minutes Intravenous On call to O.R. 06/07/13 1503 06/08/13 1231       Patient was given sequential compression devices, early ambulation to prevent DVT.  Patient benefited maximally from hospital stay and there were no complications.    Recent vital signs: BP 130/85  Pulse 89  Temp(Src) 98 F (36.7 C) (Oral)  Resp 18  Ht 5\' 5"  (1.651 m)  Wt 97.5 kg (214 lb 15.2 oz)  BMI 35.77 kg/m2  SpO2 100%  LMP  05/25/2013   Discharge Medications:     Medication List    STOP taking these medications       BACK & BODY EXTRA STRENGTH PO     cyclobenzaprine 10 MG tablet  Commonly known as:  FLEXERIL     fish oil-omega-3 fatty acids 1000 MG capsule      TAKE these medications       bimatoprost 0.03 % ophthalmic solution  Commonly known as:  LUMIGAN  Place 1 drop into both eyes 2 (two) times daily.     brinzolamide 1 % ophthalmic suspension  Commonly known as:  AZOPT  Place 1 drop into both eyes 3 (three) times daily.     cholecalciferol 1000 UNITS tablet  Commonly known as:  VITAMIN D  Take 1,000 Units by mouth daily.     CoQ-10 100 MG Caps  Take 100 mg by mouth daily.     cyanocobalamin 500 MCG tablet  Take 500 mcg by mouth daily.     travoprost (benzalkonium) 0.004 % ophthalmic solution  Commonly known as:  TRAVATAN  Place 1 drop into both eyes at bedtime.        Diagnostic Studies: Dg Cervical Spine 2-3 Views  06/08/2013   CLINICAL DATA:  Cervical fusion  EXAM: CERVICAL SPINE - 2-3 VIEW; DG C-ARM 1-60 MIN  COMPARISON:  MR 01/04/2013  FINDINGS: Intraoperative lateral fluoroscopic spot images document changes of instrumented ACDF C4-C7 with anterior  plate and screws projecting in expected location, interbody cages C4-C7. Cervicothoracic junction not well visualized.  IMPRESSION: ACDF C4-C7   Electronically Signed   By: Arne Cleveland M.D.   On: 06/08/2013 16:48   Dg C-arm 1-60 Min  06/08/2013   CLINICAL DATA:  Cervical fusion  EXAM: CERVICAL SPINE - 2-3 VIEW; DG C-ARM 1-60 MIN  COMPARISON:  MR 01/04/2013  FINDINGS: Intraoperative lateral fluoroscopic spot images document changes of instrumented ACDF C4-C7 with anterior plate and screws projecting in expected location, interbody cages C4-C7. Cervicothoracic junction not well visualized.  IMPRESSION: ACDF C4-C7   Electronically Signed   By: Arne Cleveland M.D.   On: 06/08/2013 16:48    Disposition: 01-Home or Self  Care   POD #1 after C4-7 acdf with resolution of bilateral arm pain  - d/c home today  - f/u in 2 weeks  - philly collar for showering  - precautions discussed with patient   Signed: Justice Britain 06/23/2013, 7:44 AM

## 2013-06-27 ENCOUNTER — Encounter: Payer: Self-pay | Admitting: Family Medicine

## 2013-09-02 ENCOUNTER — Telehealth: Payer: Self-pay | Admitting: Family Medicine
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
## 2013-09-05 NOTE — Telephone Encounter (Signed)
This is an order from ortho for physical therapy - recommending therapy at [facility] on [address] in [place].  This is the therapist associated with Dr. Laurena Bering office which I think is fine.   That is where ortho wanted her to go so I recommend she call # on order form to schedule.  Doesn't need referral from me. Placed referral order in Kim's box.  She has original.

## 2014-09-13 ENCOUNTER — Other Ambulatory Visit: Payer: Self-pay | Admitting: Family Medicine

## 2014-09-13 ENCOUNTER — Other Ambulatory Visit (INDEPENDENT_AMBULATORY_CARE_PROVIDER_SITE_OTHER): Payer: 59

## 2014-09-13 DIAGNOSIS — E559 Vitamin D deficiency, unspecified: Secondary | ICD-10-CM | POA: Insufficient documentation

## 2014-09-13 DIAGNOSIS — E669 Obesity, unspecified: Secondary | ICD-10-CM | POA: Diagnosis not present

## 2014-09-13 LAB — LIPID PANEL
CHOL/HDL RATIO: 4
Cholesterol: 208 mg/dL — ABNORMAL HIGH (ref 0–200)
HDL: 49.2 mg/dL (ref >39.00)
LDL Cholesterol: 134 mg/dL — ABNORMAL HIGH (ref 0–99)
NONHDL: 158.8
Triglycerides: 125 mg/dL (ref 0.0–149.0)
VLDL: 25 mg/dL (ref 0.0–40.0)

## 2014-09-13 LAB — BASIC METABOLIC PANEL
BUN: 14 mg/dL (ref 6–23)
CALCIUM: 9.4 mg/dL (ref 8.4–10.5)
CO2: 29 meq/L (ref 19–32)
Chloride: 102 mEq/L (ref 96–112)
Creatinine, Ser: 1.15 mg/dL (ref 0.40–1.20)
GFR: 62.36 mL/min (ref >60.00)
GLUCOSE: 94 mg/dL (ref 70–99)
Potassium: 3.8 mEq/L (ref 3.5–5.1)
Sodium: 136 mEq/L (ref 135–145)

## 2014-09-13 LAB — VITAMIN D 25 HYDROXY (VIT D DEFICIENCY, FRACTURES): VITD: 51.5 ng/mL (ref 30.00–100.00)

## 2014-09-18 ENCOUNTER — Ambulatory Visit (INDEPENDENT_AMBULATORY_CARE_PROVIDER_SITE_OTHER): Payer: 59 | Admitting: Family Medicine

## 2014-09-18 ENCOUNTER — Other Ambulatory Visit (HOSPITAL_COMMUNITY)
Admission: RE | Admit: 2014-09-18 | Discharge: 2014-09-18 | Disposition: A | Discharge status: Home / Self Care | Payer: 59 | Source: Ambulatory Visit | Attending: Family Medicine | Admitting: Family Medicine

## 2014-09-18 ENCOUNTER — Encounter: Payer: Self-pay | Admitting: Family Medicine

## 2014-09-18 VITALS — BP 140/94 | HR 64 | Temp 98.7°F | Ht 65.0 in | Wt 208.2 lb

## 2014-09-18 DIAGNOSIS — E669 Obesity, unspecified: Secondary | ICD-10-CM | POA: Insufficient documentation

## 2014-09-18 DIAGNOSIS — E785 Hyperlipidemia, unspecified: Secondary | ICD-10-CM | POA: Insufficient documentation

## 2014-09-18 DIAGNOSIS — R030 Elevated blood-pressure reading, without diagnosis of hypertension: Secondary | ICD-10-CM

## 2014-09-18 DIAGNOSIS — Z124 Encounter for screening for malignant neoplasm of cervix: Secondary | ICD-10-CM

## 2014-09-18 DIAGNOSIS — E66811 Obesity, class 1: Secondary | ICD-10-CM | POA: Insufficient documentation

## 2014-09-18 DIAGNOSIS — Z01419 Encounter for gynecological examination (general) (routine) without abnormal findings: Secondary | ICD-10-CM | POA: Insufficient documentation

## 2014-09-18 DIAGNOSIS — Z0000 Encounter for general adult medical examination without abnormal findings: Secondary | ICD-10-CM | POA: Diagnosis not present

## 2014-09-18 DIAGNOSIS — Z1151 Encounter for screening for human papillomavirus (HPV): Secondary | ICD-10-CM | POA: Diagnosis present

## 2014-09-18 DIAGNOSIS — M5412 Radiculopathy, cervical region: Secondary | ICD-10-CM | POA: Insufficient documentation

## 2014-09-18 DIAGNOSIS — E559 Vitamin D deficiency, unspecified: Secondary | ICD-10-CM

## 2014-09-18 DIAGNOSIS — M25512 Pain in left shoulder: Secondary | ICD-10-CM | POA: Insufficient documentation

## 2014-09-18 DIAGNOSIS — I10 Essential (primary) hypertension: Secondary | ICD-10-CM | POA: Insufficient documentation

## 2014-09-18 LAB — HM PAP SMEAR: HM Pap smear: NORMAL

## 2014-09-18 MED ORDER — FLUTICASONE PROPIONATE 50 MCG/ACT NA SUSP: 2.0000 | Freq: Every day | NASAL | Status: DC

## 2014-09-18 NOTE — Assessment & Plan Note (Signed)
Discussed healthy diet and lifestyle changes to affect weight loss.

## 2014-09-18 NOTE — Addendum Note (Signed)
Addended by: Ria Bush on: 09/18/2014 12:53 PM   Modules accepted: Orders, SmartSet

## 2014-09-18 NOTE — Assessment & Plan Note (Signed)
Mild off meds - discussed healthy diet changes to improve #s.

## 2014-09-18 NOTE — Progress Notes (Signed)
Pre visit review using our clinic review tool, if applicable. No additional management support is needed unless otherwise documented below in the visit note. 

## 2014-09-18 NOTE — Progress Notes (Addendum)
BP 140/94 mmHg  Pulse 64  Temp(Src) 98.7 F (37.1 C) (Oral)  Ht 5\' 5"  (1.651 m)  Wt 208 lb 4 oz (94.462 kg)  BMI 34.65 kg/m2  LMP 09/06/2014   CC: CPE  Subjective:    Patient ID: Martha Wade, female    DOB: 12/31/1955, 59 y.o.   MRN: [id number]  HPI: Martha Wade is a 59 y.o. female presenting on 09/18/2014 for Annual Exam   BP elevated today - fmhx HTN. Runs elevated at home as well.  BP Readings from Last 3 Encounters:  09/18/14 140/94  12/24/12 136/84   Left shoulder pain persists. Taking tylenol. Ongoing over last 2-3 weeks. H/o aleve allergy (hives, swelling).   Preventative: 11/2012 CPE normal pap smear and mammogram (s/p diagnostic mammo bilateral, rec f/u 1 year). Colon cancer screening - colonoscopy at [facility] with polyps, rec rpt 5 yrs 2014 Well woman - last done 2 yrs ago by PCP Dr Eliott Nine.  Mammogram - normal 2 yrs ago at [facility]. Due for rpt. DEXA 11/2012 - WNL Tdap 12/2012 LMP 09/06/2014. Heavy periods bleed lasts 1-2 wks. No intermenstrual spotting. No h/o fibroids.  Seat belt use discussed  G0P0 Lives with [name] Married Occupation: dispatcher Edu: college Activity: no regular exercise, does have treadmill and weight lifting bench Diet: increasing water, fruits/vegetables daily  Relevant past medical, surgical, family and social history reviewed and updated as indicated. Interim medical history since our last visit reviewed. Allergies and medications reviewed and updated. Current Outpatient Prescriptions on File Prior to Visit  Medication Sig  . bimatoprost (LUMIGAN) 0.03 % ophthalmic solution Place 1 drop into both eyes 2 (two) times daily.  . brinzolamide (AZOPT) 1 % ophthalmic suspension Place 1 drop into both eyes 3 (three) times daily.  . cholecalciferol (VITAMIN D) 1000 UNITS tablet Take 1,000 Units by mouth daily.  . Coenzyme Q10 (COQ-10) 100 MG CAPS Take 100 mg by mouth daily.  . cyanocobalamin 500 MCG tablet Take 500 mcg by mouth daily.     . travoprost, benzalkonium, (TRAVATAN) 0.004 % ophthalmic solution Place 1 drop into both eyes at bedtime.   No current facility-administered medications on file prior to visit.    Review of Systems  Constitutional: Negative for fever, chills, activity change, appetite change, fatigue and unexpected weight change.  HENT: Negative for hearing loss.   Eyes: Negative for visual disturbance.  Respiratory: Positive for wheezing (?upper resp tract). Negative for cough, chest tightness and shortness of breath.   Cardiovascular: Negative for chest pain, palpitations and leg swelling.  Gastrointestinal: Negative for nausea, vomiting, abdominal pain, diarrhea, constipation, blood in stool and abdominal distention.  Genitourinary: Negative for hematuria and difficulty urinating.  Musculoskeletal: Negative for myalgias, arthralgias and neck pain.  Skin: Negative for rash.  Neurological: Negative for dizziness, seizures, syncope and headaches.  Hematological: Negative for adenopathy. Does not bruise/bleed easily.  Psychiatric/Behavioral: Negative for dysphoric mood. The patient is not nervous/anxious.    Per HPI unless specifically indicated above     Objective:    BP 140/94 mmHg  Pulse 64  Temp(Src) 98.7 F (37.1 C) (Oral)  Ht 5\' 5"  (1.651 m)  Wt 208 lb 4 oz (94.462 kg)  BMI 34.65 kg/m2  LMP 09/06/2014  Wt Readings from Last 3 Encounters:  09/18/14 208 lb 4 oz (94.462 kg)  12/24/12 212 lb 4 oz (96.276 kg)    Physical Exam  Constitutional: She is oriented to person, place, and time. She appears well-developed and well-nourished. No distress.  HENT:  Head: Normocephalic and atraumatic.  Right Ear: Hearing, tympanic membrane, external ear and ear canal normal.  Left Ear: Hearing, tympanic membrane, external ear and ear canal normal.  Nose: Mucosal edema (swollen pale nasal turbinates) present.  Mouth/Throat: Uvula is midline, oropharynx is clear and moist and mucous membranes are  normal. No oropharyngeal exudate, posterior oropharyngeal edema or posterior oropharyngeal erythema.  Eyes: Conjunctivae and EOM are normal. Pupils are equal, round, and reactive to light. No scleral icterus.  Neck: Normal range of motion. Neck supple. No thyromegaly present.  Cardiovascular: Normal rate, regular rhythm, normal heart sounds and intact distal pulses.   No murmur heard. Pulses:      Radial pulses are 2+ on the right side, and 2+ on the left side.  Pulmonary/Chest: Effort normal and breath sounds normal. No respiratory distress. She has no wheezes. She has no rales. Right breast exhibits no inverted nipple, no mass, no nipple discharge, no skin change and no tenderness. Left breast exhibits no inverted nipple, no mass, no nipple discharge, no skin change and no tenderness.  Abdominal: Soft. Bowel sounds are normal. She exhibits no distension and no mass. There is no tenderness. There is no rebound and no guarding.  Genitourinary: Vagina normal and uterus normal. Pelvic exam was performed with patient supine. There is no rash, tenderness, lesion or injury on the right labia. There is no rash, tenderness, lesion or injury on the left labia. Cervix exhibits no motion tenderness, no discharge and no friability. Right adnexum displays no mass, no tenderness and no fullness. Left adnexum displays no mass, no tenderness and no fullness.  Uterus not enlarged as far as I could tell  Musculoskeletal: Normal range of motion. She exhibits no edema.  No midline cervical spine pain, no cervical mm spasm Tender to palpation at L shoulder bursa  Lymphadenopathy:       Head (right side): No submental, no submandibular, no tonsillar, no preauricular and no posterior auricular adenopathy present.       Head (left side): No submental, no submandibular, no tonsillar, no preauricular and no posterior auricular adenopathy present.    She has no cervical adenopathy.    She has no axillary adenopathy.        Right axillary: No lateral adenopathy present.       Left axillary: No lateral adenopathy present.      Right: No supraclavicular adenopathy present.       Left: No supraclavicular adenopathy present.  Neurological: She is alert and oriented to person, place, and time.  CN grossly intact, station and gait intact  Skin: Skin is warm and dry. No rash noted.  Psychiatric: She has a normal mood and affect. Her behavior is normal. Judgment and thought content normal.  Nursing note and vitals reviewed.  Results for orders placed or performed in visit on 09/13/14  Lipid panel  Result Value Ref Range   Cholesterol 208 (H) 0 - 200 mg/dL   Triglycerides 125.0 0.0 - 149.0 mg/dL   HDL 49.20 >39.00 mg/dL   VLDL 25.0 0.0 - 40.0 mg/dL   LDL Cholesterol 134 (H) 0 - 99 mg/dL   Total CHOL/HDL Ratio 4    NonHDL 427.06   Basic metabolic panel  Result Value Ref Range   Sodium 136 135 - 145 mEq/L   Potassium 3.8 3.5 - 5.1 mEq/L   Chloride 102 96 - 112 mEq/L   CO2 29 19 - 32 mEq/L   Glucose, Bld 94 70 - 99  mg/dL   BUN 14 6 - 23 mg/dL   Creatinine, Ser 1.15 0.40 - 1.20 mg/dL   Calcium 9.4 8.4 - 10.5 mg/dL   GFR 62.36 >60.00 mL/min  Vit D  25 hydroxy (rtn osteoporosis monitoring)  Result Value Ref Range   VITD 51.50 30.00 - 100.00 ng/mL      Assessment & Plan:   Problem List Items Addressed This Visit    Vitamin D deficiency    Levels well controlled on supplementation.      Obesity (BMI 30.0-34.9)    Discussed healthy diet and lifestyle changes to affect weight loss.      Left shoulder pain    Anticipate bursitis +/- RTC injury, doubt cervical etiology Treat with continued acetaminophen ES, and provided with resistance band and stretching exercises from [facility] pt advisor RTC if not improving to consider steroid injection      HLD (hyperlipidemia)    Mild off meds - discussed healthy diet changes to improve #s.      Health maintenance examination - Primary    Preventative protocols  reviewed and updated unless pt declined. Discussed healthy diet and lifestyle.       Elevated blood pressure reading without diagnosis of hypertension    Discussed goal blood pressure, discussed lifestyle changes to affect improvement in #s.  Pt educational handout provided today as well as DASH diet.          Follow up plan: Return as needed, for annual exam, prior fasting for blood work.

## 2014-09-18 NOTE — Assessment & Plan Note (Signed)
Levels well controlled on supplementation.

## 2014-09-18 NOTE — Assessment & Plan Note (Signed)
Discussed goal blood pressure, discussed lifestyle changes to affect improvement in #s.  Pt educational handout provided today as well as DASH diet.

## 2014-09-18 NOTE — Addendum Note (Signed)
Addended by: Royann Shivers A on: 09/18/2014 02:47 PM   Modules accepted: Orders

## 2014-09-18 NOTE — Assessment & Plan Note (Signed)
Preventative protocols reviewed and updated unless pt declined. Discussed healthy diet and lifestyle.  

## 2014-09-18 NOTE — Patient Instructions (Addendum)
Sign release for records up front from colonoscopy at [facility].  Call [facility] to schedule mammogram.  Monitor blood pressure at home and let me know if consistently >140/90.  Work on low salt/sodium diet - goal <1.5gm (1,500mg ) per day. Eat a diet high in fruits/vegetables and whole grains.  Look into mediterranean and DASH diet. Goal activity is 174min/wk of moderate intensity exercise.  This can be split into 30 minute chunks.  If you are not at this level, you can start with smaller 10-15 min increments and slowly build up activity. Look at [place].org for more resources  DASH Eating Plan DASH stands for "Dietary Approaches to Stop Hypertension." The DASH eating plan is a healthy eating plan that has been shown to reduce high blood pressure (hypertension). Additional health benefits may include reducing the risk of type 2 diabetes mellitus, heart disease, and stroke. The DASH eating plan may also help with weight loss. WHAT DO I NEED TO KNOW ABOUT THE DASH EATING PLAN? For the DASH eating plan, you will follow these general guidelines:  Choose foods with a percent daily value for sodium of less than 5% (as listed on the food label).  Use salt-free seasonings or herbs instead of table salt or sea salt.  Check with your health care provider or pharmacist before using salt substitutes.  Eat lower-sodium products, often labeled as "lower sodium" or "no salt added."  Eat fresh foods.  Eat more vegetables, fruits, and low-fat dairy products.  Choose whole grains. Look for the word "whole" as the first word in the ingredient list.  Choose fish and skinless chicken or [place] more often than red meat. Limit fish, poultry, and meat to 6 oz (170 g) each day.  Limit sweets, desserts, sugars, and sugary drinks.  Choose heart-healthy fats.  Limit cheese to 1 oz (28 g) per day.  Eat more home-cooked food and less restaurant, buffet, and fast food.  Limit fried foods.  Cook  foods using methods other than frying.  Limit canned vegetables. If you do use them, rinse them well to decrease the sodium.  When eating at a restaurant, ask that your food be prepared with less salt, or no salt if possible. WHAT FOODS CAN I EAT? Seek help from a dietitian for individual calorie needs. Grains Whole grain or whole wheat bread. Brown rice. Whole grain or whole wheat pasta. Quinoa, bulgur, and whole grain cereals. Low-sodium cereals. Corn or whole wheat flour tortillas. Whole grain cornbread. Whole grain crackers. Low-sodium crackers. Vegetables Fresh or frozen vegetables (raw, steamed, roasted, or grilled). Low-sodium or reduced-sodium tomato and vegetable juices. Low-sodium or reduced-sodium tomato sauce and paste. Low-sodium or reduced-sodium canned vegetables.  Fruits All fresh, canned (in natural juice), or frozen fruits. Meat and Other Protein Products Ground beef (85% or leaner), grass-fed beef, or beef trimmed of fat. Skinless chicken or [place]. Ground chicken or [place]. Pork trimmed of fat. All fish and seafood. Eggs. Dried beans, peas, or lentils. Unsalted nuts and seeds. Unsalted canned beans. Dairy Low-fat dairy products, such as skim or 1% milk, 2% or reduced-fat cheeses, low-fat ricotta or cottage cheese, or plain low-fat yogurt. Low-sodium or reduced-sodium cheeses. Fats and Oils Tub margarines without trans fats. Light or reduced-fat mayonnaise and salad dressings (reduced sodium). Avocado. Safflower, olive, or canola oils. Natural peanut or almond butter. Other Unsalted popcorn and pretzels. The items listed above may not be a complete list of recommended foods or beverages. Contact your dietitian for more options. WHAT FOODS  ARE NOT RECOMMENDED? Grains White bread. White pasta. White rice. Refined cornbread. Bagels and croissants. Crackers that contain trans fat. Vegetables Creamed or fried vegetables. Vegetables in a cheese sauce. Regular canned vegetables.  Regular canned tomato sauce and paste. Regular tomato and vegetable juices. Fruits Dried fruits. Canned fruit in light or heavy syrup. Fruit juice. Meat and Other Protein Products Fatty cuts of meat. Ribs, chicken wings, bacon, sausage, bologna, salami, chitterlings, fatback, hot dogs, bratwurst, and packaged luncheon meats. Salted nuts and seeds. Canned beans with salt. Dairy Whole or 2% milk, cream, half-and-half, and cream cheese. Whole-fat or sweetened yogurt. Full-fat cheeses or blue cheese. Nondairy creamers and whipped toppings. Processed cheese, cheese spreads, or cheese curds. Condiments Onion and garlic salt, seasoned salt, table salt, and sea salt. Canned and packaged gravies. Worcestershire sauce. Tartar sauce. Barbecue sauce. Teriyaki sauce. Soy sauce, including reduced sodium. Steak sauce. Fish sauce. Oyster sauce. Cocktail sauce. Horseradish. Ketchup and mustard. Meat flavorings and tenderizers. Bouillon cubes. Hot sauce. Tabasco sauce. Marinades. Taco seasonings. Relishes. Fats and Oils Butter, stick margarine, lard, shortening, ghee, and bacon fat. Coconut, palm kernel, or palm oils. Regular salad dressings. Other Pickles and olives. Salted popcorn and pretzels. The items listed above may not be a complete list of foods and beverages to avoid. Contact your dietitian for more information. WHERE CAN I FIND MORE INFORMATION? National Heart, Lung, and Blood Institute: travelstabloid.com Document Released: 04/17/2011 Document Revised: 09/12/2013 Document Reviewed: 03/02/2013 [facility] Patient Information 2015 [place], [place]. This information is not intended to replace advice given to you by your health care provider. Make sure you discuss any questions you have with your health care provider.

## 2014-09-18 NOTE — Assessment & Plan Note (Signed)
Anticipate bursitis +/- RTC injury, doubt cervical etiology Treat with continued acetaminophen ES, and provided with resistance band and stretching exercises from [facility] pt advisor RTC if not improving to consider steroid injection

## 2014-09-19 LAB — CYTOLOGY - PAP

## 2014-09-20 ENCOUNTER — Encounter: Payer: Self-pay | Admitting: *Deleted

## 2014-12-22 ENCOUNTER — Ambulatory Visit: Payer: 59 | Admitting: Family Medicine

## 2014-12-29 ENCOUNTER — Ambulatory Visit (INDEPENDENT_AMBULATORY_CARE_PROVIDER_SITE_OTHER): Payer: 59 | Admitting: Family Medicine

## 2014-12-29 ENCOUNTER — Encounter: Payer: Self-pay | Admitting: Family Medicine

## 2014-12-29 VITALS — BP 148/98 | HR 65 | Temp 98.5°F | Wt 195.2 lb

## 2014-12-29 DIAGNOSIS — E669 Obesity, unspecified: Secondary | ICD-10-CM | POA: Diagnosis not present

## 2014-12-29 DIAGNOSIS — I10 Essential (primary) hypertension: Secondary | ICD-10-CM | POA: Diagnosis not present

## 2014-12-29 MED ORDER — AMLODIPINE BESYLATE 5 MG PO TABS: 5.0000 mg | ORAL_TABLET | Freq: Every day | ORAL | Status: DC

## 2014-12-29 NOTE — Assessment & Plan Note (Signed)
Discussed healthy diet and lifestyle changes to affect sustainable weight los.

## 2014-12-29 NOTE — Progress Notes (Signed)
BP 148/98 mmHg  Pulse 65  Temp(Src) 98.5 F (36.9 C) (Oral)  Wt 195 lb 4 oz (88.565 kg)  SpO2 99%   CC: f/u visit  Subjective:    Patient ID: Martha Wade, female    DOB: 06-05-1955, 59 y.o.   MRN: [id number]  HPI: Julian Askin is a 59 y.o. female presenting on 12/29/2014 for Follow-up and Decreased Visual Acuity   Recent dx R eye cataract. Has f/u with Dr Wallace Going in 3 months.   HTN - no dx hypertension but does have fmhx. Does check blood pressures at home: 130-149/80-90s. Denies HA, vision changes, CP/tightness, SOB, leg swelling.   BP Readings from Last 3 Encounters:  12/29/14 148/98  09/18/14 140/94  06/09/13 130/85   Weight loss noted - 214lb - 195lb. Eating better. Exercising. Stopped fried foods.   Relevant past medical, surgical, family and social history reviewed and updated as indicated. Interim medical history since our last visit reviewed. Allergies and medications reviewed and updated. Current Outpatient Prescriptions on File Prior to Visit  Medication Sig  . bimatoprost (LUMIGAN) 0.03 % ophthalmic solution Place 1 drop into both eyes 2 (two) times daily.  . brinzolamide (AZOPT) 1 % ophthalmic suspension Place 1 drop into both eyes 3 (three) times daily.  . cholecalciferol (VITAMIN D) 1000 UNITS tablet Take 1,000 Units by mouth daily.  . Coenzyme Q10 (COQ-10) 100 MG CAPS Take 100 mg by mouth daily.  . cyanocobalamin 500 MCG tablet Take 500 mcg by mouth daily.   . fluticasone (FLONASE) 50 MCG/ACT nasal spray Place 2 sprays into both nostrils daily.  . travoprost, benzalkonium, (TRAVATAN) 0.004 % ophthalmic solution Place 1 drop into both eyes at bedtime.   No current facility-administered medications on file prior to visit.    Review of Systems Per HPI unless specifically indicated above     Objective:    BP 148/98 mmHg  Pulse 65  Temp(Src) 98.5 F (36.9 C) (Oral)  Wt 195 lb 4 oz (88.565 kg)  SpO2 99%  Wt Readings from Last 3 Encounters:    12/29/14 195 lb 4 oz (88.565 kg)  09/18/14 208 lb 4 oz (94.462 kg)  06/08/13 214 lb 15.2 oz (97.5 kg)   Body mass index is 32.49 kg/(m^2).  Physical Exam  Constitutional: She appears well-developed and well-nourished. No distress.  HENT:  Mouth/Throat: Oropharynx is clear and moist. No oropharyngeal exudate.  Neck: No thyromegaly present.  Cardiovascular: Normal rate, regular rhythm, normal heart sounds and intact distal pulses.   No murmur heard. Pulmonary/Chest: Effort normal and breath sounds normal. No respiratory distress. She has no wheezes. She has no rales.  Musculoskeletal: She exhibits no edema.  Skin: Skin is warm and dry. No rash noted.  Nursing note and vitals reviewed.  Results for orders placed or performed in visit on 09/20/14  HM PAP SMEAR  Result Value Ref Range   HM Pap smear Normal-Repeat 2-3 years       Assessment & Plan:   Problem List Items Addressed This Visit    Obesity (BMI 30.0-34.9)    Discussed healthy diet and lifestyle changes to affect sustainable weight los.      Essential hypertension - Primary    bp remaining elevated - start amlodipine 5mg  daily. rtc 2-3 mo f/u visit Pt educational handout provided today.      Relevant Medications   amLODipine (NORVASC) 5 MG tablet       Follow up plan: Return in about 3 months (around 03/31/2015), or  as needed, for follow up visit.

## 2014-12-29 NOTE — Progress Notes (Signed)
Pre visit review using our clinic review tool, if applicable. No additional management support is needed unless otherwise documented below in the visit note. 

## 2014-12-29 NOTE — Assessment & Plan Note (Signed)
bp remaining elevated - start amlodipine 5mg  daily. rtc 2-3 mo f/u visit Pt educational handout provided today.

## 2014-12-29 NOTE — Patient Instructions (Addendum)
Blood pressure remaining elevated. Your goal blood pressure is <140/90. Work on low salt/sodium diet - goal <1.5gm (1,500mg ) per day. Eat a diet high in fruits/vegetables and whole grains.   Goal activity is 143min/wk of moderate intensity exercise.  This can be split into 30 minute chunks. If you are not at this level, you can start with smaller 10-15 min increments and slowly build up activity. Look at [place] for more resources  Let's start amlodipine 5mg  daily.  Return in 2-3 months for follow up

## 2015-03-27 ENCOUNTER — Ambulatory Visit: Payer: 59 | Admitting: Family Medicine

## 2015-04-19 ENCOUNTER — Ambulatory Visit (INDEPENDENT_AMBULATORY_CARE_PROVIDER_SITE_OTHER): Payer: 59 | Admitting: Family Medicine

## 2015-04-19 ENCOUNTER — Encounter: Payer: Self-pay | Admitting: Family Medicine

## 2015-04-19 VITALS — BP 136/88 | HR 80 | Temp 98.7°F | Wt 203.0 lb

## 2015-04-19 DIAGNOSIS — I10 Essential (primary) hypertension: Secondary | ICD-10-CM | POA: Diagnosis not present

## 2015-04-19 DIAGNOSIS — E669 Obesity, unspecified: Secondary | ICD-10-CM

## 2015-04-19 NOTE — Assessment & Plan Note (Addendum)
BP improved - continue amlodipine 5mg  daily. Reviewed DASH diet and handout provided. Discussed importance of weight loss. Pt motivated for this.

## 2015-04-19 NOTE — Assessment & Plan Note (Addendum)
Encouraged healthy diet and lifestyle changes to affect sustainable weight loss. Discussed possible pharmacotherapy if no improvement in weight at next visit - likely avoid phentermine.

## 2015-04-19 NOTE — Progress Notes (Signed)
Pre visit review using our clinic review tool, if applicable. No additional management support is needed unless otherwise documented below in the visit note. 

## 2015-04-19 NOTE — Patient Instructions (Addendum)
Incorporate exercise into routine.  Continue amlodipine 5mg  daily.  Call to schedule mammogram at [facility].  Return for follow up in 3-4 months.  DASH Eating Plan DASH stands for "Dietary Approaches to Stop Hypertension." The DASH eating plan is a healthy eating plan that has been shown to reduce high blood pressure (hypertension). Additional health benefits may include reducing the risk of type 2 diabetes mellitus, heart disease, and stroke. The DASH eating plan may also help with weight loss. WHAT DO I NEED TO KNOW ABOUT THE DASH EATING PLAN? For the DASH eating plan, you will follow these general guidelines:  Choose foods with a percent daily value for sodium of less than 5% (as listed on the food label).  Use salt-free seasonings or herbs instead of table salt or sea salt.  Check with your health care provider or pharmacist before using salt substitutes.  Eat lower-sodium products, often labeled as "lower sodium" or "no salt added."  Eat fresh foods.  Eat more vegetables, fruits, and low-fat dairy products.  Choose whole grains. Look for the word "whole" as the first word in the ingredient list.  Choose fish and skinless chicken or [place] more often than red meat. Limit fish, poultry, and meat to 6 oz (170 g) each day.  Limit sweets, desserts, sugars, and sugary drinks.  Choose heart-healthy fats.  Limit cheese to 1 oz (28 g) per day.  Eat more home-cooked food and less restaurant, buffet, and fast food.  Limit fried foods.  Cook foods using methods other than frying.  Limit canned vegetables. If you do use them, rinse them well to decrease the sodium.  When eating at a restaurant, ask that your food be prepared with less salt, or no salt if possible. WHAT FOODS CAN I EAT? Seek help from a dietitian for individual calorie needs. Grains Whole grain or whole wheat bread. Brown rice. Whole grain or whole wheat pasta. Quinoa, bulgur, and whole grain cereals.  Low-sodium cereals. Corn or whole wheat flour tortillas. Whole grain cornbread. Whole grain crackers. Low-sodium crackers. Vegetables Fresh or frozen vegetables (raw, steamed, roasted, or grilled). Low-sodium or reduced-sodium tomato and vegetable juices. Low-sodium or reduced-sodium tomato sauce and paste. Low-sodium or reduced-sodium canned vegetables.  Fruits All fresh, canned (in natural juice), or frozen fruits. Meat and Other Protein Products Ground beef (85% or leaner), grass-fed beef, or beef trimmed of fat. Skinless chicken or [place]. Ground chicken or [place]. Pork trimmed of fat. All fish and seafood. Eggs. Dried beans, peas, or lentils. Unsalted nuts and seeds. Unsalted canned beans. Dairy Low-fat dairy products, such as skim or 1% milk, 2% or reduced-fat cheeses, low-fat ricotta or cottage cheese, or plain low-fat yogurt. Low-sodium or reduced-sodium cheeses. Fats and Oils Tub margarines without trans fats. Light or reduced-fat mayonnaise and salad dressings (reduced sodium). Avocado. Safflower, olive, or canola oils. Natural peanut or almond butter. Other Unsalted popcorn and pretzels. The items listed above may not be a complete list of recommended foods or beverages. Contact your dietitian for more options. WHAT FOODS ARE NOT RECOMMENDED? Grains White bread. White pasta. White rice. Refined cornbread. Bagels and croissants. Crackers that contain trans fat. Vegetables Creamed or fried vegetables. Vegetables in a cheese sauce. Regular canned vegetables. Regular canned tomato sauce and paste. Regular tomato and vegetable juices. Fruits Dried fruits. Canned fruit in light or heavy syrup. Fruit juice. Meat and Other Protein Products Fatty cuts of meat. Ribs, chicken wings, bacon, sausage, bologna, salami, chitterlings, fatback, hot dogs, bratwurst,  and packaged luncheon meats. Salted nuts and seeds. Canned beans with salt. Dairy Whole or 2% milk, cream, half-and-half, and cream  cheese. Whole-fat or sweetened yogurt. Full-fat cheeses or blue cheese. Nondairy creamers and whipped toppings. Processed cheese, cheese spreads, or cheese curds. Condiments Onion and garlic salt, seasoned salt, table salt, and sea salt. Canned and packaged gravies. Worcestershire sauce. Tartar sauce. Barbecue sauce. Teriyaki sauce. Soy sauce, including reduced sodium. Steak sauce. Fish sauce. Oyster sauce. Cocktail sauce. Horseradish. Ketchup and mustard. Meat flavorings and tenderizers. Bouillon cubes. Hot sauce. Tabasco sauce. Marinades. Taco seasonings. Relishes. Fats and Oils Butter, stick margarine, lard, shortening, ghee, and bacon fat. Coconut, palm kernel, or palm oils. Regular salad dressings. Other Pickles and olives. Salted popcorn and pretzels. The items listed above may not be a complete list of foods and beverages to avoid. Contact your dietitian for more information. WHERE CAN I FIND MORE INFORMATION? National Heart, Lung, and Blood Institute: [employer]   This information is not intended to replace advice given to you by your health care provider. Make sure you discuss any questions you have with your health care provider.   Document Released: 04/17/2011 Document Revised: 05/19/2014 Document Reviewed: 03/02/2013 Elsevier Interactive Patient Education [employer].

## 2015-04-19 NOTE — Progress Notes (Signed)
   BP 136/88 mmHg  Pulse 80  Temp(Src) 98.7 F (37.1 C) (Oral)  Wt 203 lb (92.08 kg)   CC: 4 mo f/u visit  Subjective:    Patient ID: Martha Wade, female    DOB: 29-May-1955, 59 y.o.   MRN: [id number]  HPI: Martha Wade is a 59 y.o. female presenting on 04/19/2015 for Follow-up   HTN - Compliant with current antihypertensive regimen of amlodipine 5mg  daily. Has changed diet - less salt. Does check blood pressures at home: 130-150/80-100. No low blood pressure readings or symptoms of dizziness/syncope. Denies HA, vision changes, CP/tightness, SOB, leg swelling.   Working outside for exercise.   Relevant past medical, surgical, family and social history reviewed and updated as indicated. Interim medical history since our last visit reviewed. Allergies and medications reviewed and updated. Current Outpatient Prescriptions on File Prior to Visit  Medication Sig  . amLODipine (NORVASC) 5 MG tablet Take 1 tablet (5 mg total) by mouth daily.  . brinzolamide (AZOPT) 1 % ophthalmic suspension Place 1 drop into both eyes 3 (three) times daily.  . Coenzyme Q10 (COQ-10) 100 MG CAPS Take 100 mg by mouth daily.  . cyanocobalamin 500 MCG tablet Take 500 mcg by mouth daily.   . fluticasone (FLONASE) 50 MCG/ACT nasal spray Place 2 sprays into both nostrils daily.  . travoprost, benzalkonium, (TRAVATAN) 0.004 % ophthalmic solution Place 1 drop into both eyes at bedtime.   No current facility-administered medications on file prior to visit.    Review of Systems Per HPI unless specifically indicated in ROS section     Objective:    BP 136/88 mmHg  Pulse 80  Temp(Src) 98.7 F (37.1 C) (Oral)  Wt 203 lb (92.08 kg)  Wt Readings from Last 3 Encounters:  04/19/15 203 lb (92.08 kg)  12/29/14 195 lb 4 oz (88.565 kg)  09/18/14 208 lb 4 oz (94.462 kg)  Body mass index is 33.78 kg/(m^2).  Physical Exam  Constitutional: She appears well-developed and well-nourished. No distress.  HENT:  Head:  Normocephalic and atraumatic.  Mouth/Throat: Oropharynx is clear and moist. No oropharyngeal exudate.  Cardiovascular: Normal rate, regular rhythm, normal heart sounds and intact distal pulses.   No murmur heard. Pulmonary/Chest: Effort normal and breath sounds normal. No respiratory distress. She has no wheezes. She has no rales.  Musculoskeletal: She exhibits no edema.  Skin: Skin is warm and dry. No rash noted.  Psychiatric: She has a normal mood and affect.  Nursing note and vitals reviewed.  Results for orders placed or performed in visit on 09/20/14  HM PAP SMEAR  Result Value Ref Range   HM Pap smear Normal-Repeat 2-3 years       Assessment & Plan:   Problem List Items Addressed This Visit    Obesity (BMI 30.0-34.9)    Encouraged healthy diet and lifestyle changes to affect sustainable weight loss. Discussed possible pharmacotherapy if no improvement in weight at next visit - likely avoid phentermine.      Essential hypertension - Primary    BP improved - continue amlodipine 5mg  daily. Reviewed DASH diet and handout provided. Discussed importance of weight loss. Pt motivated for this.          Follow up plan: Return in about 4 months (around 08/18/2015), or as needed, for follow up visit.

## 2015-05-01 ENCOUNTER — Other Ambulatory Visit: Payer: Self-pay | Admitting: Family Medicine

## 2015-05-13 DIAGNOSIS — K429 Umbilical hernia without obstruction or gangrene: Secondary | ICD-10-CM

## 2015-05-13 HISTORY — DX: Umbilical hernia without obstruction or gangrene: K42.9

## 2015-05-29 ENCOUNTER — Other Ambulatory Visit: Payer: Self-pay | Admitting: Family Medicine

## 2015-08-20 ENCOUNTER — Ambulatory Visit: Payer: 59 | Admitting: Family Medicine

## 2015-09-16 ENCOUNTER — Other Ambulatory Visit: Payer: Self-pay | Admitting: Family Medicine

## 2015-09-16 DIAGNOSIS — E559 Vitamin D deficiency, unspecified: Secondary | ICD-10-CM

## 2015-09-16 DIAGNOSIS — E785 Hyperlipidemia, unspecified: Secondary | ICD-10-CM

## 2015-09-16 DIAGNOSIS — I10 Essential (primary) hypertension: Secondary | ICD-10-CM

## 2015-09-16 DIAGNOSIS — Z1159 Encounter for screening for other viral diseases: Secondary | ICD-10-CM

## 2015-09-19 ENCOUNTER — Other Ambulatory Visit: Payer: Self-pay | Admitting: Family Medicine

## 2015-09-19 ENCOUNTER — Other Ambulatory Visit (INDEPENDENT_AMBULATORY_CARE_PROVIDER_SITE_OTHER): Payer: BLUE CROSS/BLUE SHIELD

## 2015-09-19 DIAGNOSIS — I10 Essential (primary) hypertension: Secondary | ICD-10-CM

## 2015-09-19 DIAGNOSIS — E559 Vitamin D deficiency, unspecified: Secondary | ICD-10-CM | POA: Diagnosis not present

## 2015-09-19 DIAGNOSIS — E785 Hyperlipidemia, unspecified: Secondary | ICD-10-CM

## 2015-09-19 DIAGNOSIS — Z1159 Encounter for screening for other viral diseases: Secondary | ICD-10-CM

## 2015-09-19 LAB — VITAMIN D 25 HYDROXY (VIT D DEFICIENCY, FRACTURES): VITD: 49.21 ng/mL (ref 30.00–100.00)

## 2015-09-19 LAB — LIPID PANEL
Cholesterol: 209 mg/dL — ABNORMAL HIGH (ref 0–200)
HDL: 45.9 mg/dL (ref >39.00)
LDL Cholesterol: 135 mg/dL — ABNORMAL HIGH (ref 0–99)
NONHDL: 162.78
Total CHOL/HDL Ratio: 5
Triglycerides: 140 mg/dL (ref 0.0–149.0)
VLDL: 28 mg/dL (ref 0.0–40.0)

## 2015-09-19 LAB — BASIC METABOLIC PANEL
BUN: 16 mg/dL (ref 6–23)
CALCIUM: 9.6 mg/dL (ref 8.4–10.5)
CO2: 28 mEq/L (ref 19–32)
Chloride: 104 mEq/L (ref 96–112)
Creatinine, Ser: 1.25 mg/dL — ABNORMAL HIGH (ref 0.40–1.20)
GFR: 56.44 mL/min — AB (ref >60.00)
Glucose, Bld: 103 mg/dL — ABNORMAL HIGH (ref 70–99)
Potassium: 3.9 mEq/L (ref 3.5–5.1)
SODIUM: 139 meq/L (ref 135–145)

## 2015-09-20 LAB — HEPATITIS C ANTIBODY: HCV AB: NEGATIVE

## 2015-09-24 ENCOUNTER — Encounter: Payer: 59 | Admitting: Family Medicine

## 2015-09-28 ENCOUNTER — Ambulatory Visit (INDEPENDENT_AMBULATORY_CARE_PROVIDER_SITE_OTHER)
Admission: RE | Admit: 2015-09-28 | Discharge: 2015-09-28 | Disposition: A | Discharge status: Home / Self Care | Payer: BLUE CROSS/BLUE SHIELD | Source: Ambulatory Visit | Attending: Family Medicine | Admitting: Family Medicine

## 2015-09-28 ENCOUNTER — Encounter: Payer: Self-pay | Admitting: Family Medicine

## 2015-09-28 ENCOUNTER — Ambulatory Visit (INDEPENDENT_AMBULATORY_CARE_PROVIDER_SITE_OTHER): Payer: BLUE CROSS/BLUE SHIELD | Admitting: Family Medicine

## 2015-09-28 VITALS — BP 106/76 | HR 78 | Temp 98.3°F | Ht 65.0 in | Wt 213.5 lb

## 2015-09-28 DIAGNOSIS — M542 Cervicalgia: Secondary | ICD-10-CM

## 2015-09-28 DIAGNOSIS — M79645 Pain in left finger(s): Secondary | ICD-10-CM | POA: Insufficient documentation

## 2015-09-28 MED ORDER — DICLOFENAC SODIUM 75 MG PO TBEC: 75.0000 mg | DELAYED_RELEASE_TABLET | Freq: Two times a day (BID) | ORAL | Status: DC

## 2015-09-28 NOTE — Patient Instructions (Addendum)
Start diclofenac 75 mg twice daily.  Heat. Limit motion of neck, wear cervical collar if you have one. Keep appt with Dr. Lynann Bologna.
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
## 2015-09-28 NOTE — Assessment & Plan Note (Signed)
With radiculopathy. No red flags or need for urgent treatment.  No sign of new fracture on X-ray. Start NSAID, cervical collar and keep follow up with Research scientist (physical sciences).

## 2015-09-28 NOTE — Assessment & Plan Note (Signed)
No fracture on X-ray. Likely secondary to strain of thumb. NSAIDs, elevation

## 2015-10-09 ENCOUNTER — Encounter: Payer: Self-pay | Admitting: Family Medicine

## 2015-10-09 ENCOUNTER — Other Ambulatory Visit (HOSPITAL_COMMUNITY)
Admission: RE | Admit: 2015-10-09 | Discharge: 2015-10-09 | Disposition: A | Discharge status: Home / Self Care | Payer: BLUE CROSS/BLUE SHIELD | Source: Ambulatory Visit | Attending: Family Medicine | Admitting: Family Medicine

## 2015-10-09 ENCOUNTER — Ambulatory Visit (INDEPENDENT_AMBULATORY_CARE_PROVIDER_SITE_OTHER): Payer: BLUE CROSS/BLUE SHIELD | Admitting: Family Medicine

## 2015-10-09 ENCOUNTER — Other Ambulatory Visit: Payer: Self-pay | Admitting: Family Medicine

## 2015-10-09 VITALS — BP 122/74 | HR 74 | Temp 97.9°F | Ht 65.0 in | Wt 210.2 lb

## 2015-10-09 DIAGNOSIS — N760 Acute vaginitis: Secondary | ICD-10-CM | POA: Insufficient documentation

## 2015-10-09 DIAGNOSIS — Z0000 Encounter for general adult medical examination without abnormal findings: Secondary | ICD-10-CM

## 2015-10-09 DIAGNOSIS — I10 Essential (primary) hypertension: Secondary | ICD-10-CM

## 2015-10-09 DIAGNOSIS — E669 Obesity, unspecified: Secondary | ICD-10-CM | POA: Diagnosis not present

## 2015-10-09 DIAGNOSIS — E559 Vitamin D deficiency, unspecified: Secondary | ICD-10-CM | POA: Diagnosis not present

## 2015-10-09 DIAGNOSIS — N289 Disorder of kidney and ureter, unspecified: Secondary | ICD-10-CM | POA: Diagnosis not present

## 2015-10-09 DIAGNOSIS — Z01419 Encounter for gynecological examination (general) (routine) without abnormal findings: Secondary | ICD-10-CM | POA: Insufficient documentation

## 2015-10-09 DIAGNOSIS — Z124 Encounter for screening for malignant neoplasm of cervix: Secondary | ICD-10-CM

## 2015-10-09 DIAGNOSIS — Z1231 Encounter for screening mammogram for malignant neoplasm of breast: Secondary | ICD-10-CM

## 2015-10-09 DIAGNOSIS — E785 Hyperlipidemia, unspecified: Secondary | ICD-10-CM

## 2015-10-09 DIAGNOSIS — N183 Chronic kidney disease, stage 3 unspecified: Secondary | ICD-10-CM | POA: Insufficient documentation

## 2015-10-09 NOTE — Assessment & Plan Note (Signed)
Well replaced on supplementation.

## 2015-10-09 NOTE — Assessment & Plan Note (Signed)
Discussed with patient - rec avoid NSAIDs for now, discussed good hydration. Recheck labs in 2-3 months.

## 2015-10-09 NOTE — Progress Notes (Signed)
Pre visit review using our clinic review tool, if applicable. No additional management support is needed unless otherwise documented below in the visit note. 
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
## 2015-10-09 NOTE — Patient Instructions (Addendum)
Sign release for colonoscopy from [facility] up front. Kidneys slightly bumped - drink plenty of water and avoid anti inflammatories like aleve, advil, ibuprofen and motrin.. Return in 2-3 months to recheck levels (lab visit only). Good to see you today, call us with questions. Return as needed or in 6 months for follow up visit.  Menopause is a normal process in which your reproductive ability comes to an end. This process happens gradually over a span of months to years, usually between the ages of 45 and 58. Menopause is complete when you have missed 12 consecutive menstrual periods. It is important to talk with your health care provider about some of the most common conditions that affect postmenopausal women, such as heart disease, cancer, and bone loss (osteoporosis). Adopting a healthy lifestyle and getting preventive care can help to promote your health and wellness. Those actions can also lower your chances of developing some of these common conditions. WHAT SHOULD I KNOW ABOUT MENOPAUSE? During menopause, you may experience a number of symptoms, such as:  Moderate-to-severe hot flashes.  Night sweats.  Decrease in sex drive.  Mood swings.  Headaches.  Tiredness.  Irritability.  Memory problems.  Insomnia. Choosing to treat or not to treat menopausal changes is an individual decision that you make with your health care provider. WHAT SHOULD I KNOW ABOUT HORMONE REPLACEMENT THERAPY AND SUPPLEMENTS? Hormone therapy products are effective for treating symptoms that are associated with menopause, such as hot flashes and night sweats. Hormone replacement carries certain risks, especially as you become older. If you are thinking about using estrogen or estrogen with progestin treatments, discuss the benefits and risks with your health care provider. WHAT SHOULD I KNOW ABOUT HEART DISEASE AND STROKE? Heart disease, heart attack, and stroke become more likely as you age. This may be due, in  part, to the hormonal changes that your body experiences during menopause. These can affect how your body processes dietary fats, triglycerides, and cholesterol. Heart attack and stroke are both medical emergencies. There are many things that you can do to help prevent heart disease and stroke:  Have your blood pressure checked at least every 1-2 years. High blood pressure causes heart disease and increases the risk of stroke.  If you are 80-108 years old, ask your health care provider if you should take aspirin to prevent a heart attack or a stroke.  Do not use any tobacco products, including cigarettes, chewing tobacco, or electronic cigarettes. If you need help quitting, ask your health care provider.  It is important to eat a healthy diet and maintain a healthy weight.  Be sure to include plenty of vegetables, fruits, low-fat dairy products, and lean protein.  Avoid eating foods that are high in solid fats, added sugars, or salt (sodium).  Get regular exercise. This is one of the most important things that you can do for your health.  Try to exercise for at least 150 minutes each week. The type of exercise that you do should increase your heart rate and make you sweat. This is known as moderate-intensity exercise.  Try to do strengthening exercises at least twice each week. Do these in addition to the moderate-intensity exercise.  Know your numbers.Ask your health care provider to check your cholesterol and your blood glucose. Continue to have your blood tested as directed by your health care provider. WHAT SHOULD I KNOW ABOUT CANCER SCREENING? There are several types of cancer. Take the following steps to reduce your risk and to catch  any cancer development as early as possible. Breast Cancer  Practice breast self-awareness.  This means understanding how your breasts normally appear and feel.  It also means doing regular breast self-exams. Let your health care provider know about  any changes, no matter how small.  If you are 36 or older, have a clinician do a breast exam (clinical breast exam or CBE) every year. Depending on your age, family history, and medical history, it may be recommended that you also have a yearly breast X-ray (mammogram).  If you have a family history of breast cancer, talk with your health care provider about genetic screening.  If you are at high risk for breast cancer, talk with your health care provider about having an MRI and a mammogram every year.  Breast cancer (BRCA) gene test is recommended for women who have family members with BRCA-related cancers. Results of the assessment will determine the need for genetic counseling and BRCA1 and for BRCA2 testing. BRCA-related cancers include these types:  Breast. This occurs in males or females.  Ovarian.  Tubal. This may also be called fallopian tube cancer.  Cancer of the abdominal or pelvic lining (peritoneal cancer).  Prostate.  Pancreatic. Cervical, Uterine, and Ovarian Cancer Your health care provider may recommend that you be screened regularly for cancer of the pelvic organs. These include your ovaries, uterus, and vagina. This screening involves a pelvic exam, which includes checking for microscopic changes to the surface of your cervix (Pap test).  For women ages 21-65, health care providers may recommend a pelvic exam and a Pap test every three years. For women ages 43-65, they may recommend the Pap test and pelvic exam, combined with testing for human papilloma virus (HPV), every five years. Some types of HPV increase your risk of cervical cancer. Testing for HPV may also be done on women of any age who have unclear Pap test results.  Other health care providers may not recommend any screening for nonpregnant women who are considered low risk for pelvic cancer and have no symptoms. Ask your health care provider if a screening pelvic exam is right for you.  If you have had past  treatment for cervical cancer or a condition that could lead to cancer, you need Pap tests and screening for cancer for at least 20 years after your treatment. If Pap tests have been discontinued for you, your risk factors (such as having a new sexual partner) need to be reassessed to determine if you should start having screenings again. Some women have medical problems that increase the chance of getting cervical cancer. In these cases, your health care provider may recommend that you have screening and Pap tests more often.  If you have a family history of uterine cancer or ovarian cancer, talk with your health care provider about genetic screening.  If you have vaginal bleeding after reaching menopause, tell your health care provider.  There are currently no reliable tests available to screen for ovarian cancer. Lung Cancer Lung cancer screening is recommended for adults 1-29 years old who are at high risk for lung cancer because of a history of smoking. A yearly low-dose CT scan of the lungs is recommended if you:  Currently smoke.  Have a history of at least 30 pack-years of smoking and you currently smoke or have quit within the past 15 years. A pack-year is smoking an average of one pack of cigarettes per day for one year. Yearly screening should:  Continue until it has  been 15 years since you quit.  Stop if you develop a health problem that would prevent you from having lung cancer treatment. Colorectal Cancer  This type of cancer can be detected and can often be prevented.  Routine colorectal cancer screening usually begins at age 57 and continues through age 59.  If you have risk factors for colon cancer, your health care provider may recommend that you be screened at an earlier age.  If you have a family history of colorectal cancer, talk with your health care provider about genetic screening.  Your health care provider may also recommend using home test kits to check for  hidden blood in your stool.  A small camera at the end of a tube can be used to examine your colon directly (sigmoidoscopy or colonoscopy). This is done to check for the earliest forms of colorectal cancer.  Direct examination of the colon should be repeated every 5-10 years until age 15. However, if early forms of precancerous polyps or small growths are found or if you have a family history or genetic risk for colorectal cancer, you may need to be screened more often. Skin Cancer  Check your skin from head to toe regularly.  Monitor any moles. Be sure to tell your health care provider:  About any new moles or changes in moles, especially if there is a change in a mole's shape or color.  If you have a mole that is larger than the size of a pencil eraser.  If any of your family members has a history of skin cancer, especially at a young age, talk with your health care provider about genetic screening.  Always use sunscreen. Apply sunscreen liberally and repeatedly throughout the day.  Whenever you are outside, protect yourself by wearing long sleeves, pants, a wide-brimmed hat, and sunglasses. WHAT SHOULD I KNOW ABOUT OSTEOPOROSIS? Osteoporosis is a condition in which bone destruction happens more quickly than new bone creation. After menopause, you may be at an increased risk for osteoporosis. To help prevent osteoporosis or the bone fractures that can happen because of osteoporosis, the following is recommended:  If you are 42-15 years old, get at least 1,000 mg of calcium and at least 600 mg of vitamin D per day.  If you are older than age 83 but younger than age 36, get at least 1,200 mg of calcium and at least 600 mg of vitamin D per day.  If you are older than age 109, get at least 1,200 mg of calcium and at least 800 mg of vitamin D per day. Smoking and excessive alcohol intake increase the risk of osteoporosis. Eat foods that are rich in calcium and vitamin D, and do weight-bearing  exercises several times each week as directed by your health care provider. WHAT SHOULD I KNOW ABOUT HOW MENOPAUSE AFFECTS [place]? Depression may occur at any age, but it is more common as you become older. Common symptoms of depression include:  Low or sad mood.  Changes in sleep patterns.  Changes in appetite or eating patterns.  Feeling an overall lack of motivation or enjoyment of activities that you previously enjoyed.  Frequent crying spells. Talk with your health care provider if you think that you are experiencing depression. WHAT SHOULD I KNOW ABOUT IMMUNIZATIONS? It is important that you get and maintain your immunizations. These include:  Tetanus, diphtheria, and pertussis (Tdap) booster vaccine.  Influenza every year before the flu season begins.  Pneumonia vaccine.  Shingles vaccine.  Your health care provider may also recommend other immunizations.   This information is not intended to replace advice given to you by your health care provider. Make sure you discuss any questions you have with your health care provider.   Document Released: 06/20/2005 Document Revised: 05/19/2014 Document Reviewed: 12/29/2013 Elsevier Interactive Patient Education [employer].

## 2015-10-09 NOTE — Assessment & Plan Note (Signed)
Preventative protocols reviewed and updated unless pt declined. Discussed healthy diet and lifestyle.  

## 2015-10-09 NOTE — Assessment & Plan Note (Signed)
Discussed healthy diet and lifestyle changes to affect sustainable weight loss  

## 2015-10-09 NOTE — Assessment & Plan Note (Signed)
Reviewed latest labs with patient.

## 2015-10-09 NOTE — Addendum Note (Signed)
Addended by: Emelia Salisbury C on: 10/09/2015 01:13 PM   Modules accepted: Orders

## 2015-10-09 NOTE — Assessment & Plan Note (Signed)
Chronic, stable. Continue current regimen. 

## 2015-10-10 LAB — CYTOLOGY - PAP

## 2015-10-11 LAB — CERVICOVAGINAL ANCILLARY ONLY
BACTERIAL VAGINITIS: NEGATIVE
CANDIDA VAGINITIS: NEGATIVE

## 2015-10-16 ENCOUNTER — Other Ambulatory Visit: Payer: Self-pay | Admitting: Family Medicine

## 2015-10-16 ENCOUNTER — Ambulatory Visit
Admission: RE | Admit: 2015-10-16 | Discharge: 2015-10-16 | Disposition: A | Discharge status: Home / Self Care | Payer: BLUE CROSS/BLUE SHIELD | Source: Ambulatory Visit | Attending: Family Medicine | Admitting: Family Medicine

## 2015-10-16 DIAGNOSIS — Z1231 Encounter for screening mammogram for malignant neoplasm of breast: Secondary | ICD-10-CM

## 2015-10-16 LAB — HM MAMMOGRAPHY

## 2015-10-17 ENCOUNTER — Encounter: Payer: Self-pay | Admitting: *Deleted

## 2015-12-24 ENCOUNTER — Other Ambulatory Visit: Payer: Self-pay | Admitting: Family Medicine

## 2015-12-24 DIAGNOSIS — N289 Disorder of kidney and ureter, unspecified: Secondary | ICD-10-CM

## 2015-12-25 ENCOUNTER — Other Ambulatory Visit (INDEPENDENT_AMBULATORY_CARE_PROVIDER_SITE_OTHER): Payer: BLUE CROSS/BLUE SHIELD

## 2015-12-25 DIAGNOSIS — N289 Disorder of kidney and ureter, unspecified: Secondary | ICD-10-CM

## 2015-12-25 LAB — CBC WITH DIFFERENTIAL/PLATELET
BASOS PCT: 0.6 % (ref 0.0–3.0)
Basophils Absolute: 0 10*3/uL (ref 0.0–0.1)
EOS ABS: 0.2 10*3/uL (ref 0.0–0.7)
EOS PCT: 4.5 % (ref 0.0–5.0)
HCT: 35.1 % — ABNORMAL LOW (ref 36.0–46.0)
Hemoglobin: 11.8 g/dL — ABNORMAL LOW (ref 12.0–15.0)
LYMPHS ABS: 2 10*3/uL (ref 0.7–4.0)
Lymphocytes Relative: 38.4 % (ref 12.0–46.0)
MCHC: 33.5 g/dL (ref 30.0–36.0)
MCV: 90.2 fl (ref 78.0–100.0)
MONO ABS: 0.5 10*3/uL (ref 0.1–1.0)
Monocytes Relative: 8.8 % (ref 3.0–12.0)
NEUTROS PCT: 47.7 % (ref 43.0–77.0)
Neutro Abs: 2.5 10*3/uL (ref 1.4–7.7)
Platelets: 228 10*3/uL (ref 150.0–400.0)
RBC: 3.89 Mil/uL (ref 3.87–5.11)
RDW: 15.1 % (ref 11.5–15.5)
WBC: 5.2 10*3/uL (ref 4.0–10.5)

## 2015-12-25 LAB — RENAL FUNCTION PANEL
ALBUMIN: 4 g/dL (ref 3.5–5.2)
BUN: 9 mg/dL (ref 6–23)
CALCIUM: 9.5 mg/dL (ref 8.4–10.5)
CO2: 28 mEq/L (ref 19–32)
Chloride: 105 mEq/L (ref 96–112)
Creatinine, Ser: 1.19 mg/dL (ref 0.40–1.20)
GFR: 59.69 mL/min — ABNORMAL LOW (ref >60.00)
GLUCOSE: 114 mg/dL — AB (ref 70–99)
POTASSIUM: 4 meq/L (ref 3.5–5.1)
Phosphorus: 4.2 mg/dL (ref 2.3–4.6)
SODIUM: 140 meq/L (ref 135–145)

## 2015-12-25 LAB — TSH: TSH: 2.33 u[IU]/mL (ref 0.35–4.50)

## 2015-12-26 LAB — PARATHYROID HORMONE, INTACT (NO CA): PTH: 61 pg/mL (ref 14–64)

## 2016-01-24 ENCOUNTER — Ambulatory Visit (INDEPENDENT_AMBULATORY_CARE_PROVIDER_SITE_OTHER): Payer: BLUE CROSS/BLUE SHIELD | Admitting: Family Medicine

## 2016-01-24 ENCOUNTER — Encounter: Payer: Self-pay | Admitting: Family Medicine

## 2016-01-24 VITALS — BP 122/80 | HR 79 | Temp 97.9°F | Wt 203.2 lb

## 2016-01-24 DIAGNOSIS — R1011 Right upper quadrant pain: Secondary | ICD-10-CM | POA: Diagnosis not present

## 2016-01-24 DIAGNOSIS — N289 Disorder of kidney and ureter, unspecified: Secondary | ICD-10-CM | POA: Diagnosis not present

## 2016-01-24 MED ORDER — ONDANSETRON HCL 4 MG PO TABS: 4.0000 mg | ORAL_TABLET | Freq: Three times a day (TID) | ORAL | 0 refills | Status: DC | PRN

## 2016-01-24 NOTE — Patient Instructions (Addendum)
I am suspicious for gallstones causing pain. Avoid fatty greasy foods for now.  Labs today, we will order ultrasound. We will be in touch with results.   Cholelithiasis Cholelithiasis (also called gallstones) is a form of gallbladder disease in which gallstones form in your gallbladder. The gallbladder is an organ that stores bile made in the liver, which helps digest fats. Gallstones begin as small crystals and slowly grow into stones. Gallstone pain occurs when the gallbladder spasms and a gallstone is blocking the duct. Pain can also occur when a stone passes out of the duct.  RISK FACTORS  Being female.   Having multiple pregnancies. Health care providers sometimes advise removing diseased gallbladders before future pregnancies.   Being obese.  Eating a diet heavy in fried foods and fat.   Being older than 41 years and increasing age.   Prolonged use of medicines containing female hormones.   Having diabetes mellitus.   Rapidly losing weight.   Having a family history of gallstones (heredity).  SYMPTOMS  Nausea.   Vomiting.  Abdominal pain.   Yellowing of the skin (jaundice).   Sudden pain. It may persist from several minutes to several hours.  Fever.   Tenderness to the touch. In some cases, when gallstones do not move into the bile duct, people have no pain or symptoms. These are called "silent" gallstones.  TREATMENT Silent gallstones do not need treatment. In severe cases, emergency surgery may be required. Options for treatment include:  Surgery to remove the gallbladder. This is the most common treatment.  Medicines. These do not always work and may take 6-12 months or more to work.  Shock wave treatment (extracorporeal biliary lithotripsy). In this treatment an ultrasound machine sends shock waves to the gallbladder to break gallstones into smaller pieces that can pass into the intestines or be dissolved by medicine. HOME CARE INSTRUCTIONS    Only take over-the-counter or prescription medicines for pain, discomfort, or fever as directed by your health care provider.   Follow a low-fat diet until seen again by your health care provider. Fat causes the gallbladder to contract, which can result in pain.   Follow up with your health care provider as directed. Attacks are almost always recurrent and surgery is usually required for permanent treatment.  SEEK IMMEDIATE MEDICAL CARE IF:   Your pain increases and is not controlled by medicines.   You have a fever or persistent symptoms for more than 2-3 days.   You have a fever and your symptoms suddenly get worse.   You have persistent nausea and vomiting.  MAKE SURE YOU:   Understand these instructions.  Will watch your condition.  Will get help right away if you are not doing well or get worse.   This information is not intended to replace advice given to you by your health care provider. Make sure you discuss any questions you have with your health care provider.   Document Released: 04/24/2005 Document Revised: 12/29/2012 Document Reviewed: 10/20/2012 Elsevier Interactive Patient Education [employer].

## 2016-01-24 NOTE — Progress Notes (Signed)
Pre visit review using our clinic review tool, if applicable. No additional management support is needed unless otherwise documented below in the visit note. 

## 2016-01-24 NOTE — Progress Notes (Signed)
BP 122/80   Pulse 79   Temp 97.9 F (36.6 C) (Oral)   Wt 203 lb 4 oz (92.2 kg)   LMP 09/06/2014   SpO2 97%   BMI 33.82 kg/m    CC: abd pain Subjective:    Patient ID: Martha Wade, female    DOB: April 25, 1956, 60 y.o.   MRN: [id number]  HPI: Martha Wade is a 60 y.o. female presenting on 01/24/2016 for Abdominal Pain   1 mo h/o abd discomfort. Started after eating [place] sandwich - may have been bad. 1/2 hr later started noticing worsening abd pain, nausea/vomiting x1. 2 wks later had another episode of abd pain with vomiting x3. 5d ago had 3rd episode of abdominal pain after cheese omelet with bacon - again vomited x1. Feels better the next day. Each episode describes sharp RUQ pain with radiation to shoulder blade, lasts hours. Only relieved with vomiting. Notices some mild dysphagia. Denies fevers/chills, diarrhea or constipation. No GERD.  No recent travel.   Takes OTC allergy medication Rexal brand as well as tylenol 1000mg  once daily regularly. H/o citrus acid allergy.   Colonoscopy 2014 Vira Agar) report not yet available.   Relevant past medical, surgical, family and social history reviewed and updated as indicated. Interim medical history since our last visit reviewed. Allergies and medications reviewed and updated. Current Outpatient Prescriptions on File Prior to Visit  Medication Sig  . amLODipine (NORVASC) 5 MG tablet TAKE 1 TABLET(5 MG) BY MOUTH DAILY  . Cholecalciferol (VITAMIN D-3) 1000 UNITS CAPS Take 1,000 Units by mouth daily.  . Coenzyme Q10 (COQ-10) 100 MG CAPS Take 100 mg by mouth daily.  . cyanocobalamin 500 MCG tablet Take 500 mcg by mouth daily.   . dorzolamide (TRUSOPT) 2 % ophthalmic solution PLACE 1 DROP INTO BOTH EYES TWICE A DAY  . fluticasone (FLONASE) 50 MCG/ACT nasal spray Place 2 sprays into both nostrils daily. (Patient taking differently: Place 2 sprays into both nostrils daily as needed. )  . latanoprost (XALATAN) 0.005 % ophthalmic  solution   . timolol (TIMOPTIC) 0.5 % ophthalmic solution   . travoprost, benzalkonium, (TRAVATAN) 0.004 % ophthalmic solution Place 1 drop into both eyes at bedtime.   No current facility-administered medications on file prior to visit.     Review of Systems Per HPI unless specifically indicated in ROS section     Objective:    BP 122/80   Pulse 79   Temp 97.9 F (36.6 C) (Oral)   Wt 203 lb 4 oz (92.2 kg)   LMP 09/06/2014   SpO2 97%   BMI 33.82 kg/m   Wt Readings from Last 3 Encounters:  01/24/16 203 lb 4 oz (92.2 kg)  10/09/15 210 lb 4 oz (95.4 kg)  09/28/15 213 lb 8 oz (96.8 kg)    Physical Exam  Constitutional: She appears well-developed and well-nourished. No distress.  HENT:  Mouth/Throat: Oropharynx is clear and moist. No oropharyngeal exudate.  Eyes: Conjunctivae are normal. Pupils are equal, round, and reactive to light.  Cardiovascular: Normal rate, regular rhythm, normal heart sounds and intact distal pulses.   No murmur heard. Pulmonary/Chest: Effort normal and breath sounds normal. No respiratory distress. She has no wheezes. She has no rales.  Abdominal: Soft. Bowel sounds are normal. She exhibits no distension and no mass. There is no hepatosplenomegaly. There is tenderness (minimal) in the right upper quadrant. There is no rigidity, no rebound, no guarding, no CVA tenderness and negative Murphy's sign.  Musculoskeletal: She exhibits no  edema.  Skin: Skin is warm and dry. No rash noted.  Psychiatric: She has a normal mood and affect.  Nursing note and vitals reviewed.  Results for orders placed or performed in visit on 12/25/15  Renal function panel  Result Value Ref Range   Sodium 140 135 - 145 mEq/L   Potassium 4.0 3.5 - 5.1 mEq/L   Chloride 105 96 - 112 mEq/L   CO2 28 19 - 32 mEq/L   Calcium 9.5 8.4 - 10.5 mg/dL   Albumin 4.0 3.5 - 5.2 g/dL   BUN 9 6 - 23 mg/dL   Creatinine, Ser 1.19 0.40 - 1.20 mg/dL   Glucose, Bld 114 (H) 70 - 99 mg/dL    Phosphorus 4.2 2.3 - 4.6 mg/dL   GFR 59.69 (L) >60.00 mL/min  CBC with Differential/Platelet  Result Value Ref Range   WBC 5.2 4.0 - 10.5 K/uL   RBC 3.89 3.87 - 5.11 Mil/uL   Hemoglobin 11.8 (L) 12.0 - 15.0 g/dL   HCT 35.1 (L) 36.0 - 46.0 %   MCV 90.2 78.0 - 100.0 fl   MCHC 33.5 30.0 - 36.0 g/dL   RDW 15.1 11.5 - 15.5 %   Platelets 228.0 150.0 - 400.0 K/uL   Neutrophils Relative % 47.7 43.0 - 77.0 %   Lymphocytes Relative 38.4 12.0 - 46.0 %   Monocytes Relative 8.8 3.0 - 12.0 %   Eosinophils Relative 4.5 0.0 - 5.0 %   Basophils Relative 0.6 0.0 - 3.0 %   Neutro Abs 2.5 1.4 - 7.7 K/uL   Lymphs Abs 2.0 0.7 - 4.0 K/uL   Monocytes Absolute 0.5 0.1 - 1.0 K/uL   Eosinophils Absolute 0.2 0.0 - 0.7 K/uL   Basophils Absolute 0.0 0.0 - 0.1 K/uL  TSH  Result Value Ref Range   TSH 2.33 0.35 - 4.50 uIU/mL  Parathyroid hormone, intact (no Ca)  Result Value Ref Range   PTH 61 14 - 64 pg/mL      Assessment & Plan:   Problem List Items Addressed This Visit    Renal insufficiency   Relevant Orders   US Abdomen Complete   RUQ abdominal pain - Primary    Story/exam suspicious for biliary colic - discussed with patient. Avoid fatty foods, follow bland diet for now. Check CBC, CMP, lipase, abd [place]. Will be in touch with results. If all normal, consider HIDA scan for further evaluation of gallbladder. Pt agrees with plan.      Relevant Orders   Comprehensive metabolic panel   CBC with Differential/Platelet   Lipase   US Abdomen Complete    Other Visit Diagnoses   None.      Follow up plan: Return if symptoms worsen or fail to improve.  Ria Bush, MD

## 2016-01-24 NOTE — Assessment & Plan Note (Signed)
Story/exam suspicious for biliary colic - discussed with patient. Avoid fatty foods, follow bland diet for now. Check CBC, CMP, lipase, abd [place]. Will be in touch with results. If all normal, consider HIDA scan for further evaluation of gallbladder. Pt agrees with plan.

## 2016-01-25 LAB — COMPREHENSIVE METABOLIC PANEL
ALK PHOS: 82 U/L (ref 39–117)
ALT: 71 U/L — AB (ref 0–35)
AST: 19 U/L (ref 0–37)
Albumin: 4.2 g/dL (ref 3.5–5.2)
BILIRUBIN TOTAL: 0.4 mg/dL (ref 0.2–1.2)
BUN: 13 mg/dL (ref 6–23)
CO2: 29 meq/L (ref 19–32)
CREATININE: 1.17 mg/dL (ref 0.40–1.20)
Calcium: 9.7 mg/dL (ref 8.4–10.5)
Chloride: 104 mEq/L (ref 96–112)
GFR: 60.85 mL/min (ref >60.00)
GLUCOSE: 96 mg/dL (ref 70–99)
Potassium: 3.8 mEq/L (ref 3.5–5.1)
SODIUM: 138 meq/L (ref 135–145)
TOTAL PROTEIN: 8.2 g/dL (ref 6.0–8.3)

## 2016-01-25 LAB — CBC WITH DIFFERENTIAL/PLATELET
BASOS ABS: 0 10*3/uL (ref 0.0–0.1)
Basophils Relative: 0.8 % (ref 0.0–3.0)
EOS ABS: 0.2 10*3/uL (ref 0.0–0.7)
Eosinophils Relative: 3.5 % (ref 0.0–5.0)
HCT: 37 % (ref 36.0–46.0)
Hemoglobin: 12.6 g/dL (ref 12.0–15.0)
LYMPHS ABS: 2.5 10*3/uL (ref 0.7–4.0)
Lymphocytes Relative: 41.6 % (ref 12.0–46.0)
MCHC: 34 g/dL (ref 30.0–36.0)
MCV: 90.3 fl (ref 78.0–100.0)
MONO ABS: 0.5 10*3/uL (ref 0.1–1.0)
MONOS PCT: 9 % (ref 3.0–12.0)
NEUTROS ABS: 2.7 10*3/uL (ref 1.4–7.7)
NEUTROS PCT: 45.1 % (ref 43.0–77.0)
PLATELETS: 256 10*3/uL (ref 150.0–400.0)
RBC: 4.1 Mil/uL (ref 3.87–5.11)
RDW: 13.8 % (ref 11.5–15.5)
WBC: 6 10*3/uL (ref 4.0–10.5)

## 2016-01-25 LAB — LIPASE: LIPASE: 77 U/L — AB (ref 11.0–59.0)

## 2016-01-31 ENCOUNTER — Ambulatory Visit
Admission: RE | Admit: 2016-01-31 | Discharge: 2016-01-31 | Disposition: A | Discharge status: Home / Self Care | Payer: BLUE CROSS/BLUE SHIELD | Source: Ambulatory Visit | Attending: Family Medicine | Admitting: Family Medicine

## 2016-01-31 DIAGNOSIS — K7689 Other specified diseases of liver: Secondary | ICD-10-CM | POA: Diagnosis not present

## 2016-01-31 DIAGNOSIS — N289 Disorder of kidney and ureter, unspecified: Secondary | ICD-10-CM

## 2016-01-31 DIAGNOSIS — K8020 Calculus of gallbladder without cholecystitis without obstruction: Secondary | ICD-10-CM | POA: Insufficient documentation

## 2016-01-31 DIAGNOSIS — R1011 Right upper quadrant pain: Secondary | ICD-10-CM | POA: Insufficient documentation

## 2016-02-01 ENCOUNTER — Other Ambulatory Visit: Payer: Self-pay | Admitting: Family Medicine

## 2016-02-01 DIAGNOSIS — R1011 Right upper quadrant pain: Secondary | ICD-10-CM

## 2016-02-01 DIAGNOSIS — K8020 Calculus of gallbladder without cholecystitis without obstruction: Secondary | ICD-10-CM

## 2016-02-05 ENCOUNTER — Ambulatory Visit (INDEPENDENT_AMBULATORY_CARE_PROVIDER_SITE_OTHER): Payer: BLUE CROSS/BLUE SHIELD | Admitting: General Surgery

## 2016-02-05 ENCOUNTER — Other Ambulatory Visit: Payer: Self-pay | Admitting: General Surgery

## 2016-02-05 ENCOUNTER — Encounter: Payer: Self-pay | Admitting: General Surgery

## 2016-02-05 VITALS — BP 130/78 | HR 74 | Resp 12 | Ht 67.0 in | Wt 201.0 lb

## 2016-02-05 DIAGNOSIS — K8020 Calculus of gallbladder without cholecystitis without obstruction: Secondary | ICD-10-CM | POA: Diagnosis not present

## 2016-02-05 DIAGNOSIS — K429 Umbilical hernia without obstruction or gangrene: Secondary | ICD-10-CM | POA: Diagnosis not present

## 2016-02-05 MED ORDER — HYDROCODONE-ACETAMINOPHEN 5-325 MG PO TABS: 1.0000 | ORAL_TABLET | ORAL | 0 refills | Status: DC | PRN

## 2016-02-05 NOTE — Progress Notes (Signed)
Patient ID: Martha Wade, female   DOB: 04-Feb-1956, 60 y.o.   MRN: [id number]  Chief Complaint  Patient presents with  . Abdominal Pain    HPI Shenel Gutterman is a 60 y.o. female here today for evaluation of abdominal pain . Patient state the pain is located in her right upper quadrant and radiation to her lower shoulder blade and the pain last for hours. She has had about three attacks after eating meals with nausea and vomiting. Once she vomits she states she feels some relieve. Patient had an abdomen ultrasound done on 01/31/16. Moves her bowels every two days.  Husband [name] present at visit.  The patient reports that during her episodes of pain she had no per umbilical symptoms. She has had a hernia at the umbilical area since childhood. dHPI  Past Medical History:  Diagnosis Date  . Cervical radiculopathy    [place]  . CTS (carpal tunnel syndrome)    bilateral  . Glaucoma    early stages (Dr. Wallace Going and Dr. Gloriann Loan)  . Obesity (BMI 30.0-34.9)   . Umbilical hernia     Past Surgical History:  Procedure Laterality Date  . ANTERIOR CERVICAL DECOMP/DISCECTOMY FUSION N/A 06/08/2013   ANTERIOR CERVICAL DECOMPRESSION/DISCECTOMY FUSION 3 LEVELS;  Surgeon: Sinclair Ship, MD;  Anterior cervical decompression fusion with instrumentation and allograft (C4-7)  . COLONOSCOPY W/ BIOPSIES AND POLYPECTOMY    . DEXA  11/2012   WNL  . EYE SURGERY Left    laser    Family History  Problem Relation Age of Onset  . Glaucoma Mother   . CAD Mother   . Diabetes Mother   . Stroke Mother   . Cancer Father     unsure type  . Hypertension Brother     and sister    Social History Social History  Substance Use Topics  . Smoking status: Never Smoker  . Smokeless tobacco: Never Used  . Alcohol use No    Allergies  Allergen Reactions  . Aleve [Naproxen Sodium] Hives, Itching and Swelling  . Citrus Swelling    Current Outpatient Prescriptions  Medication Sig Dispense Refill  .  acetaminophen (TYLENOL) 500 MG tablet Take 1,000 mg by mouth daily.    Marland Kitchen amLODipine (NORVASC) 5 MG tablet TAKE 1 TABLET(5 MG) BY MOUTH DAILY 30 tablet 11  . Cholecalciferol (VITAMIN D-3) 1000 UNITS CAPS Take 1,000 Units by mouth daily.    . Coenzyme Q10 (COQ-10) 100 MG CAPS Take 100 mg by mouth daily.    . cyanocobalamin 500 MCG tablet Take 500 mcg by mouth daily.     . dorzolamide (TRUSOPT) 2 % ophthalmic solution PLACE 1 DROP INTO BOTH EYES TWICE A DAY  5  . fluticasone (FLONASE) 50 MCG/ACT nasal spray Place 2 sprays into both nostrils daily. (Patient taking differently: Place 2 sprays into both nostrils daily as needed. ) 16 g 3  . latanoprost (XALATAN) 0.005 % ophthalmic solution   4  . ondansetron (ZOFRAN) 4 MG tablet Take 1 tablet (4 mg total) by mouth every 8 (eight) hours as needed for nausea or vomiting. 20 tablet 0  . timolol (TIMOPTIC) 0.5 % ophthalmic solution   4  . travoprost, benzalkonium, (TRAVATAN) 0.004 % ophthalmic solution Place 1 drop into both eyes at bedtime.    Marland Kitchen HYDROcodone-acetaminophen (NORCO) 5-325 MG tablet Take 1-2 tablets by mouth every 4 (four) hours as needed for moderate pain. 30 tablet 0   No current facility-administered medications for this visit.  Review of Systems Review of Systems  Constitutional: Negative.   Respiratory: Negative.   Cardiovascular: Negative.   Gastrointestinal: Positive for abdominal pain, constipation, nausea and vomiting. Negative for anal bleeding, blood in stool, diarrhea and rectal pain.    Blood pressure 130/78, pulse 74, resp. rate 12, height 5\' 7"  (1.702 m), weight 201 lb (91.2 kg), last menstrual period 11/20/2015.  Physical Exam Physical Exam  Constitutional: She is oriented to person, place, and time. She appears well-developed and well-nourished.  Eyes: Conjunctivae are normal. No scleral icterus.  Neck: Neck supple.  Cardiovascular: Normal rate, regular rhythm and normal heart sounds.   Pulmonary/Chest: Effort  normal and breath sounds normal.  Abdominal: Soft. Normal appearance and bowel sounds are normal. There is no hepatomegaly. There is no tenderness. A hernia is present. Ventral: small defect umbilical hernia     Neurological: She is alert and oriented to person, place, and time.  Skin: Skin is warm and dry.    Data Reviewed Abdominal ultrasound 02/01/2016 was reviewed. Gallstones. No pericholecystic fluid. Normal common bile duct. Bile duct 4 mm.  Laboratory studies dated 01/24/2016 showed normal electrolytes and renal function. Estimated GFR 60. Mild elevation of the SGPT at 2 times normal. Otherwise normal liver function studies. Bilirubin 0.4. Normal CBC. Lipase modestly elevated at 71,( less than 59 normal)    Assessment    Recent episode of biliary colic. Possible passed common bile duct stone.     Plan    Options for management reviewed: 1)  dietary modification with hopes of no further attack versus 2) elective cholecystectomy.    Laparoscopic Cholecystectomy with Intraoperative Cholangiogram. The procedure, including it's potential risks and complications (including but not limited to infection, bleeding, injury to intra-abdominal organs or bile ducts, bile leak, poor cosmetic result, sepsis and death) were discussed with the patient in detail. Non-operative options, including their inherent risks (acute calculous cholecystitis with possible choledocholithiasis or gallstone pancreatitis, with the risk of ascending cholangitis, sepsis, and death) were discussed as well. The patient expressed and understanding of what we discussed and wishes to proceed with laparoscopic cholecystectomy. The patient further understands that if it is technically not possible, or it is unsafe to proceed laparoscopically, that I will convert to an open cholecystectomy.  Hernia precautions and incarceration were discussed with the patient. If they develop symptoms of an incarcerated hernia, they were  encouraged to seek prompt medical attention.  I have recommended repair of the herniaat the time of her upcoming cholecystectomy. I do not anticipate the need for prosthetic mesh placement.   The patient's husband is having aortic aneurysm repair by stent placement at [facility] on October 2. She elected to defer surgery until he is recovered.   This information has been scribed by Gaspar Cola CMA.   Robert Bellow 02/05/2016, 9:14 PM

## 2016-02-05 NOTE — Patient Instructions (Signed)
Hernia, Adult A hernia is the bulging of an organ or tissue through a weak spot in the muscles of the abdomen (abdominal wall). Hernias develop most often near the navel or groin. There are many kinds of hernias. Common kinds include:  Femoral hernia. This kind of hernia develops under the groin in the upper thigh area.  Inguinal hernia. This kind of hernia develops in the groin or scrotum.  Umbilical hernia. This kind of hernia develops near the navel.  Hiatal hernia. This kind of hernia causes part of the stomach to be pushed up into the chest.  Incisional hernia. This kind of hernia bulges through a scar from an abdominal surgery. CAUSES This condition may be caused by:  Heavy lifting.  Coughing over a long period of time.  Straining to have a bowel movement.  An incision made during an abdominal surgery.  A birth defect (congenital defect).  Excess weight or obesity.  Smoking.  Poor nutrition.  Cystic fibrosis.  Excess fluid in the abdomen.  Undescended testicles. SYMPTOMS Symptoms of a hernia include:  A lump on the abdomen. This is the first sign of a hernia. The lump may become more obvious with standing, straining, or coughing. It may get bigger over time if it is not treated or if the condition causing it is not treated.  Pain. A hernia is usually painless, but it may become painful over time if treatment is delayed. The pain is usually dull and may get worse with standing or lifting heavy objects. Sometimes a hernia gets tightly squeezed in the weak spot (strangulated) or stuck there (incarcerated) and causes additional symptoms. These symptoms may include:  Vomiting.  Nausea.  Constipation.  Irritability. DIAGNOSIS A hernia may be diagnosed with:  A physical exam. During the exam your health care provider may ask you to cough or to make a specific movement, because a hernia is usually more visible when you move.  Imaging tests. These can  include:  X-rays.  Ultrasound.  CT scan. TREATMENT A hernia that is small and painless may not need to be treated. A hernia that is large or painful may be treated with surgery. Inguinal hernias may be treated with surgery to prevent incarceration or strangulation. Strangulated hernias are always treated with surgery, because lack of blood to the trapped organ or tissue can cause it to die. Surgery to treat a hernia involves pushing the bulge back into place and repairing the weak part of the abdomen. HOME CARE INSTRUCTIONS  Avoid straining.  Do not lift anything heavier than 10 lb (4.5 kg).  Lift with your leg muscles, not your back muscles. This helps avoid strain.  When coughing, try to cough gently.  Prevent constipation. Constipation leads to straining with bowel movements, which can make a hernia worse or cause a hernia repair to break down. You can prevent constipation by:  Eating a high-fiber diet that includes plenty of fruits and vegetables.  Drinking enough fluids to keep your urine clear or pale yellow. Aim to drink 6-8 glasses of water per day.  Using a stool softener as directed by your health care provider.  Lose weight, if you are overweight.  Do not use any tobacco products, including cigarettes, chewing tobacco, or electronic cigarettes. If you need help quitting, ask your health care provider.  Keep all follow-up visits as directed by your health care provider. This is important. Your health care provider may need to monitor your condition. SEEK MEDICAL CARE IF:  You have   swelling, redness, and pain in the affected area.  Your bowel habits change. SEEK IMMEDIATE MEDICAL CARE IF:  You have a fever.  You have abdominal pain that is getting worse.  You feel nauseous or you vomit.  You cannot push the hernia back in place by gently pressing on it while you are lying down.  The hernia:  Changes in shape or size.  Is stuck outside the  abdomen.  Becomes discolored.  Feels hard or tender.   This information is not intended to replace advice given to you by your health care provider. Make sure you discuss any questions you have with your health care provider.   Document Released: 04/28/2005 Document Revised: 05/19/2014 Document Reviewed: 03/08/2014 Elsevier Interactive Patient Education 2016 [employer] Laparoscopic Cholecystectomy Laparoscopic cholecystectomy is surgery to remove the gallbladder. The gallbladder is located in the upper right part of the abdomen, behind the liver. It is a storage sac for bile, which is produced in the liver. Bile aids in the digestion and absorption of fats. Cholecystectomy is often done for inflammation of the gallbladder (cholecystitis). This condition is usually caused by a buildup of gallstones (cholelithiasis) in the gallbladder. Gallstones can block the flow of bile, and that can result in inflammation and pain. In severe cases, emergency surgery may be required. If emergency surgery is not required, you will have time to prepare for the procedure. Laparoscopic surgery is an alternative to open surgery. Laparoscopic surgery has a shorter recovery time. Your common bile duct may also need to be examined during the procedure. If stones are found in the common bile duct, they may be removed. LET [facility] CARE PROVIDER KNOW ABOUT:  Any allergies you have.  All medicines you are taking, including vitamins, herbs, eye drops, creams, and over-the-counter medicines.  Previous problems you or members of your family have had with the use of anesthetics.  Any blood disorders you have.  Previous surgeries you have had.  Any medical conditions you have. RISKS AND COMPLICATIONS Generally, this is a safe procedure. However, problems may occur, including:  Infection.  Bleeding.  Allergic reactions to medicines.  Damage to other structures or organs.  A stone remaining in the common  bile duct.  A bile leak from the cyst duct that is clipped when your gallbladder is removed.  The need to convert to open surgery, which requires a larger incision in the abdomen. This may be necessary if your surgeon thinks that it is not safe to continue with a laparoscopic procedure. BEFORE THE PROCEDURE  Ask your health care provider about:  Changing or stopping your regular medicines. This is especially important if you are taking diabetes medicines or blood thinners.  Taking medicines such as aspirin and ibuprofen. These medicines can thin your blood. Do not take these medicines before your procedure if your health care provider instructs you not to.  Follow instructions from your health care provider about eating or drinking restrictions.  Let your health care provider know if you develop a cold or an infection before surgery.  Plan to have someone take you home after the procedure.  Ask your health care provider how your surgical site will be marked or identified.  You may be given antibiotic medicine to help prevent infection. PROCEDURE  To reduce your risk of infection:  Your health care team will wash or sanitize their hands.  Your skin will be washed with soap.  An IV tube may be inserted into one of your veins.  You will be given a medicine to make you fall asleep (general anesthetic).  A breathing tube will be placed in your mouth.  The surgeon will make several small cuts (incisions) in your abdomen.  A thin, lighted tube (laparoscope) that has a tiny camera on the end will be inserted through one of the small incisions. The camera on the laparoscope will send a picture to a TV screen (monitor) in the operating room. This will give the surgeon a good view inside your abdomen.  A gas will be pumped into your abdomen. This will expand your abdomen to give the surgeon more room to perform the surgery.  Other tools that are needed for the procedure will be  inserted through the other incisions. The gallbladder will be removed through one of the incisions.  After your gallbladder has been removed, the incisions will be closed with stitches (sutures), staples, or skin glue.  Your incisions may be covered with a bandage (dressing). The procedure may vary among health care providers and hospitals. AFTER THE PROCEDURE  Your blood pressure, heart rate, breathing rate, and blood oxygen level will be monitored often until the medicines you were given have worn off.  You will be given medicines as needed to control your pain.   This information is not intended to replace advice given to you by your health care provider. Make sure you discuss any questions you have with your health care provider.   Document Released: 04/28/2005 Document Revised: 01/17/2015 Document Reviewed: 12/08/2012 Elsevier Interactive Patient Education [employer].

## 2016-02-06 ENCOUNTER — Telehealth: Payer: Self-pay

## 2016-02-06 NOTE — Telephone Encounter (Signed)
Spoke with patient about instructions for surgery. The patient is scheduled for surgery at [facility] on 02/28/16. She will pre admit by phone. The patient is aware of date and instructions.

## 2016-02-07 ENCOUNTER — Other Ambulatory Visit: Payer: Self-pay | Admitting: General Surgery

## 2016-02-21 ENCOUNTER — Encounter
Admission: RE | Admit: 2016-02-21 | Discharge: 2016-02-21 | Disposition: A | Discharge status: Home / Self Care | Payer: BLUE CROSS/BLUE SHIELD | Source: Ambulatory Visit | Attending: General Surgery | Admitting: General Surgery

## 2016-02-21 ENCOUNTER — Encounter: Payer: Self-pay | Admitting: Family Medicine

## 2016-02-21 HISTORY — DX: Essential (primary) hypertension: I10

## 2016-02-21 HISTORY — DX: Chronic kidney disease, unspecified: N18.9

## 2016-02-21 NOTE — Patient Instructions (Signed)
  Your procedure is scheduled on:02/28/16 Report to Day Surgery.[facility] SECOND FLOOR To find out your arrival time please call [phone] between 1PM - 3PM on 02/27/16.  Remember: Instructions that are not followed completely may result in serious medical risk, up to and including death, or upon the discretion of your surgeon and anesthesiologist your surgery may need to be rescheduled.    _X___ 1. Do not eat food or drink liquids after midnight. No gum chewing or hard candies.     _X___ 2. No Alcohol for 24 hours before or after surgery.   ____ 3. Do Not Smoke For 24 Hours Prior to Your Surgery.   ____ 4. Bring all medications with you on the day of surgery if instructed.    __X__ 5. Notify your doctor if there is any change in your medical condition     (cold, fever, infections).       Do not wear jewelry, make-up, hairpins, clips or nail polish.  Do not wear lotions, powders, or perfumes. You may wear deodorant.  Do not shave 48 hours prior to surgery. Men may shave face and neck.  Do not bring valuables to the hospital.    [facility] is not responsible for any belongings or valuables.               Contacts, dentures or bridgework may not be worn into surgery.  Leave your suitcase in the car. After surgery it may be brought to your room.  For patients admitted to the hospital, discharge time is determined by your                treatment team.   Patients discharged the day of surgery will not be allowed to drive home.   X____ Take these medicines the morning of surgery with A SIP OF WATER:    1. AMLODIPINE  2.   3.   4.  5.  6.  ____ Fleet Enema (as directed)   _X___ Use CHG Soap as directed  ____ Use inhalers on the day of surgery  ____ Stop metformin 2 days prior to surgery    ____ Take 1/2 of usual insulin dose the night before surgery and none on the morning of surgery.   ____ Stop Coumadin/Plavix/aspirin on   ____ Stop Anti-inflammatories  on   __X__ Stop supplements until after surgery.   STOP CO Q 10 UNTIL AFTER SURGERY  ____ Bring C-Pap to the hospitaL  LIVING WILL PACKET GIVEN

## 2016-02-25 ENCOUNTER — Encounter
Admission: RE | Admit: 2016-02-25 | Discharge: 2016-02-25 | Disposition: A | Discharge status: Home / Self Care | Payer: BLUE CROSS/BLUE SHIELD | Source: Ambulatory Visit | Attending: General Surgery | Admitting: General Surgery

## 2016-02-25 DIAGNOSIS — Z91018 Allergy to other foods: Secondary | ICD-10-CM | POA: Diagnosis not present

## 2016-02-25 DIAGNOSIS — I10 Essential (primary) hypertension: Secondary | ICD-10-CM | POA: Diagnosis not present

## 2016-02-25 DIAGNOSIS — Z6832 Body mass index (BMI) 32.0-32.9, adult: Secondary | ICD-10-CM | POA: Diagnosis not present

## 2016-02-25 DIAGNOSIS — Z886 Allergy status to analgesic agent status: Secondary | ICD-10-CM | POA: Diagnosis not present

## 2016-02-25 DIAGNOSIS — K8010 Calculus of gallbladder with chronic cholecystitis without obstruction: Secondary | ICD-10-CM | POA: Diagnosis present

## 2016-02-25 DIAGNOSIS — E669 Obesity, unspecified: Secondary | ICD-10-CM | POA: Diagnosis not present

## 2016-02-25 DIAGNOSIS — K439 Ventral hernia without obstruction or gangrene: Secondary | ICD-10-CM | POA: Diagnosis not present

## 2016-02-25 DIAGNOSIS — H409 Unspecified glaucoma: Secondary | ICD-10-CM | POA: Diagnosis not present

## 2016-02-28 ENCOUNTER — Ambulatory Visit: Payer: BLUE CROSS/BLUE SHIELD | Admitting: Anesthesiology

## 2016-02-28 ENCOUNTER — Encounter
Admission: RE | Disposition: A | Discharge status: Home / Self Care | Payer: Self-pay | Source: Ambulatory Visit | Attending: General Surgery

## 2016-02-28 ENCOUNTER — Ambulatory Visit: Payer: BLUE CROSS/BLUE SHIELD

## 2016-02-28 ENCOUNTER — Encounter: Payer: Self-pay | Admitting: *Deleted

## 2016-02-28 ENCOUNTER — Ambulatory Visit
Admission: RE | Admit: 2016-02-28 | Discharge: 2016-02-28 | Disposition: A | Discharge status: Home / Self Care | Payer: BLUE CROSS/BLUE SHIELD | Source: Ambulatory Visit | Attending: General Surgery | Admitting: General Surgery

## 2016-02-28 DIAGNOSIS — K8020 Calculus of gallbladder without cholecystitis without obstruction: Secondary | ICD-10-CM

## 2016-02-28 DIAGNOSIS — K8044 Calculus of bile duct with chronic cholecystitis without obstruction: Secondary | ICD-10-CM | POA: Diagnosis not present

## 2016-02-28 DIAGNOSIS — Z6832 Body mass index (BMI) 32.0-32.9, adult: Secondary | ICD-10-CM | POA: Insufficient documentation

## 2016-02-28 DIAGNOSIS — Z886 Allergy status to analgesic agent status: Secondary | ICD-10-CM | POA: Insufficient documentation

## 2016-02-28 DIAGNOSIS — E669 Obesity, unspecified: Secondary | ICD-10-CM | POA: Insufficient documentation

## 2016-02-28 DIAGNOSIS — K429 Umbilical hernia without obstruction or gangrene: Secondary | ICD-10-CM | POA: Diagnosis not present

## 2016-02-28 DIAGNOSIS — K8010 Calculus of gallbladder with chronic cholecystitis without obstruction: Secondary | ICD-10-CM | POA: Diagnosis not present

## 2016-02-28 DIAGNOSIS — H409 Unspecified glaucoma: Secondary | ICD-10-CM | POA: Insufficient documentation

## 2016-02-28 DIAGNOSIS — I10 Essential (primary) hypertension: Secondary | ICD-10-CM | POA: Insufficient documentation

## 2016-02-28 DIAGNOSIS — K439 Ventral hernia without obstruction or gangrene: Secondary | ICD-10-CM | POA: Insufficient documentation

## 2016-02-28 DIAGNOSIS — Z91018 Allergy to other foods: Secondary | ICD-10-CM | POA: Insufficient documentation

## 2016-02-28 HISTORY — PX: CHOLECYSTECTOMY: SHX55

## 2016-02-28 HISTORY — PX: UMBILICAL HERNIA REPAIR: SHX196

## 2016-02-28 LAB — HEPATIC FUNCTION PANEL
ALBUMIN: 3.6 g/dL (ref 3.5–5.0)
ALT: 28 U/L (ref 14–54)
AST: 31 U/L (ref 15–41)
Alkaline Phosphatase: 71 U/L (ref 38–126)
BILIRUBIN TOTAL: 0.6 mg/dL (ref 0.3–1.2)
Bilirubin, Direct: <0.1 mg/dL — ABNORMAL LOW (ref 0.1–0.5)
Total Protein: 7.7 g/dL (ref 6.5–8.1)

## 2016-02-28 SURGERY — LAPAROSCOPIC CHOLECYSTECTOMY WITH INTRAOPERATIVE CHOLANGIOGRAM
Anesthesia: General | Wound class: Clean Contaminated

## 2016-02-28 MED ORDER — ROCURONIUM BROMIDE 100 MG/10ML IV SOLN
INTRAVENOUS | Status: DC | PRN
  Administered 2016-02-28: 50 mg via INTRAVENOUS

## 2016-02-28 MED ORDER — LIDOCAINE HCL (CARDIAC) 20 MG/ML IV SOLN
INTRAVENOUS | Status: DC | PRN
  Administered 2016-02-28: 100 mg via INTRAVENOUS

## 2016-02-28 MED ORDER — FENTANYL CITRATE (PF) 100 MCG/2ML IJ SOLN
25.0000 ug | INTRAMUSCULAR | Status: DC | PRN
  Administered 2016-02-28 (×2): 50 ug via INTRAVENOUS

## 2016-02-28 MED ORDER — SODIUM CHLORIDE 0.9 % IV SOLN
INTRAVENOUS | Status: DC | PRN
  Administered 2016-02-28: 40 mL

## 2016-02-28 MED ORDER — BUPIVACAINE HCL (PF) 0.5 % IJ SOLN
INTRAMUSCULAR | Status: AC
  Filled 2016-02-28: qty 30

## 2016-02-28 MED ORDER — FAMOTIDINE 20 MG PO TABS
ORAL_TABLET | ORAL | Status: AC
  Filled 2016-02-28: qty 1

## 2016-02-28 MED ORDER — FENTANYL CITRATE (PF) 100 MCG/2ML IJ SOLN
INTRAMUSCULAR | Status: AC
  Administered 2016-02-28: 50 ug via INTRAVENOUS
  Filled 2016-02-28: qty 2

## 2016-02-28 MED ORDER — HYDROMORPHONE HCL 1 MG/ML IJ SOLN
INTRAMUSCULAR | Status: DC | PRN
  Administered 2016-02-28: 0.5 mg via INTRAVENOUS

## 2016-02-28 MED ORDER — ONDANSETRON HCL 4 MG/2ML IJ SOLN
INTRAMUSCULAR | Status: DC | PRN
  Administered 2016-02-28: 4 mg via INTRAVENOUS

## 2016-02-28 MED ORDER — HYDROCODONE-ACETAMINOPHEN 5-325 MG PO TABS: 1.0000 | ORAL_TABLET | ORAL | 0 refills | Status: DC | PRN

## 2016-02-28 MED ORDER — DEXAMETHASONE SODIUM PHOSPHATE 10 MG/ML IJ SOLN
INTRAMUSCULAR | Status: DC | PRN
  Administered 2016-02-28: 5 mg via INTRAVENOUS

## 2016-02-28 MED ORDER — MIDAZOLAM HCL 2 MG/2ML IJ SOLN
INTRAMUSCULAR | Status: DC | PRN
  Administered 2016-02-28: 2 mg via INTRAVENOUS

## 2016-02-28 MED ORDER — FAMOTIDINE 20 MG PO TABS
20.0000 mg | ORAL_TABLET | Freq: Once | ORAL | Status: AC
  Administered 2016-02-28: 20 mg via ORAL

## 2016-02-28 MED ORDER — OXYCODONE HCL 5 MG PO TABS
ORAL_TABLET | ORAL | Status: AC
  Filled 2016-02-28: qty 1

## 2016-02-28 MED ORDER — FENTANYL CITRATE (PF) 100 MCG/2ML IJ SOLN
INTRAMUSCULAR | Status: DC | PRN
  Administered 2016-02-28 (×2): 50 ug via INTRAVENOUS

## 2016-02-28 MED ORDER — PROPOFOL 10 MG/ML IV BOLUS
INTRAVENOUS | Status: DC | PRN
  Administered 2016-02-28: 180 mg via INTRAVENOUS

## 2016-02-28 MED ORDER — CEFAZOLIN SODIUM-DEXTROSE 2-3 GM-% IV SOLR
INTRAVENOUS | Status: DC | PRN
  Administered 2016-02-28: 2 g via INTRAVENOUS

## 2016-02-28 MED ORDER — SODIUM CHLORIDE 0.9 % IV SOLN
INTRAVENOUS | Status: DC
  Administered 2016-02-28: 08:00:00 via INTRAVENOUS

## 2016-02-28 MED ORDER — BUPIVACAINE HCL 0.5 % IJ SOLN
INTRAMUSCULAR | Status: DC | PRN
  Administered 2016-02-28: 30 mL

## 2016-02-28 MED ORDER — KETOROLAC TROMETHAMINE 30 MG/ML IJ SOLN
INTRAMUSCULAR | Status: DC | PRN
  Administered 2016-02-28: 30 mg via INTRAVENOUS

## 2016-02-28 MED ORDER — ACETAMINOPHEN 10 MG/ML IV SOLN
INTRAVENOUS | Status: AC
  Filled 2016-02-28: qty 100

## 2016-02-28 MED ORDER — OXYCODONE HCL 5 MG/5ML PO SOLN: 5.0000 mg | Freq: Once | ORAL | Status: AC | PRN

## 2016-02-28 MED ORDER — DEXMEDETOMIDINE HCL 200 MCG/2ML IV SOLN
INTRAVENOUS | Status: DC | PRN
  Administered 2016-02-28: 12 ug via INTRAVENOUS

## 2016-02-28 MED ORDER — OXYCODONE HCL 5 MG PO TABS
5.0000 mg | ORAL_TABLET | Freq: Once | ORAL | Status: AC | PRN
  Administered 2016-02-28: 5 mg via ORAL

## 2016-02-28 MED ORDER — ACETAMINOPHEN 10 MG/ML IV SOLN
INTRAVENOUS | Status: DC | PRN
  Administered 2016-02-28: 1000 mg via INTRAVENOUS

## 2016-02-28 MED ORDER — LACTATED RINGERS IV SOLN
INTRAVENOUS | Status: DC | PRN
  Administered 2016-02-28: 09:00:00 via INTRAVENOUS

## 2016-02-28 MED ORDER — SODIUM CHLORIDE 0.9 % IJ SOLN
INTRAMUSCULAR | Status: AC
  Filled 2016-02-28: qty 50

## 2016-02-28 MED ORDER — SUGAMMADEX SODIUM 200 MG/2ML IV SOLN
INTRAVENOUS | Status: DC | PRN
  Administered 2016-02-28: 200 mg via INTRAVENOUS

## 2016-02-28 SURGICAL SUPPLY — 64 items
APPLIER CLIP 5 13 M/L LIGAMAX5 (MISCELLANEOUS) ×3
APPLIER CLIP ROT 10 11.4 M/L (STAPLE)
BLADE SURG 11 STRL SS SAFETY (MISCELLANEOUS) ×3 IMPLANT
BLADE SURG 15 STRL SS SAFETY (BLADE) IMPLANT
CANISTER SUCT 1200ML W/VALVE (MISCELLANEOUS) ×3 IMPLANT
CANNULA DILATOR  5MM W/SLV (CANNULA) ×2
CANNULA DILATOR 10 W/SLV (CANNULA) ×2 IMPLANT
CANNULA DILATOR 10MM W/SLV (CANNULA) ×1
CANNULA DILATOR 5 W/SLV (CANNULA) ×4 IMPLANT
CATH CHOLANG 76X19 KUMAR (CATHETERS) ×3 IMPLANT
CHLORAPREP W/TINT 26ML (MISCELLANEOUS) ×3 IMPLANT
CLEANER CAUTERY TIP 5X5 PAD (MISCELLANEOUS) ×1 IMPLANT
CLIP APPLIE 5 13 M/L LIGAMAX5 (MISCELLANEOUS) ×1 IMPLANT
CLIP APPLIE ROT 10 11.4 M/L (STAPLE) IMPLANT
CLOSURE WOUND 1/2 X4 (GAUZE/BANDAGES/DRESSINGS) ×1
CONRAY 60ML FOR OR (MISCELLANEOUS) ×3 IMPLANT
DISSECTOR KITTNER STICK (MISCELLANEOUS) IMPLANT
DISSECTORS/KITTNER STICK (MISCELLANEOUS)
DRAPE LAPAROTOMY 100X77 ABD (DRAPES) IMPLANT
DRAPE SHEET LG 3/4 BI-LAMINATE (DRAPES) ×3 IMPLANT
DRESSING TELFA 4X3 1S ST N-ADH (GAUZE/BANDAGES/DRESSINGS) ×3 IMPLANT
DRSG TEGADERM 2-3/8X2-3/4 SM (GAUZE/BANDAGES/DRESSINGS) ×12 IMPLANT
DRSG TEGADERM 4X4.75 (GAUZE/BANDAGES/DRESSINGS) ×3 IMPLANT
DRSG TELFA 3X8 NADH (GAUZE/BANDAGES/DRESSINGS) ×3 IMPLANT
ELECT REM PT RETURN 9FT ADLT (ELECTROSURGICAL) ×3
ELECTRODE REM PT RTRN 9FT ADLT (ELECTROSURGICAL) ×1 IMPLANT
ENDOPOUCH RETRIEVER 10 (MISCELLANEOUS) IMPLANT
GAUZE SPONGE 4X4 12PLY STRL (GAUZE/BANDAGES/DRESSINGS) IMPLANT
GLOVE BIO SURGEON STRL SZ7.5 (GLOVE) ×12 IMPLANT
GLOVE INDICATOR 8.0 STRL GRN (GLOVE) ×9 IMPLANT
GOWN STRL REUS W/ TWL LRG LVL3 (GOWN DISPOSABLE) ×3 IMPLANT
GOWN STRL REUS W/TWL LRG LVL3 (GOWN DISPOSABLE) ×6
IRRIGATION STRYKERFLOW (MISCELLANEOUS) ×1 IMPLANT
IRRIGATOR STRYKERFLOW (MISCELLANEOUS) ×3
IV LACTATED RINGERS 1000ML (IV SOLUTION) ×3 IMPLANT
KIT RM TURNOVER STRD PROC AR (KITS) ×3 IMPLANT
LABEL OR SOLS (LABEL) ×3 IMPLANT
LOOP SUT CHROMIC 2-0 SGL1 (SUTURE) ×3 IMPLANT
NDL INSUFF ACCESS 14 VERSASTEP (NEEDLE) ×3 IMPLANT
NDL SAFETY 22GX1.5 (NEEDLE) IMPLANT
NEEDLE HYPO 25X1 1.5 SAFETY (NEEDLE) ×3 IMPLANT
NS IRRIG 500ML POUR BTL (IV SOLUTION) ×3 IMPLANT
PACK BASIN MINOR ARMC (MISCELLANEOUS) IMPLANT
PACK LAP CHOLECYSTECTOMY (MISCELLANEOUS) ×3 IMPLANT
PAD CLEANER CAUTERY TIP 5X5 (MISCELLANEOUS) ×2
PENCIL ELECTRO HAND CTR (MISCELLANEOUS) ×6 IMPLANT
SCISSORS METZENBAUM CVD 33 (INSTRUMENTS) ×3 IMPLANT
SEAL FOR SCOPE WARMER C3101 (MISCELLANEOUS) IMPLANT
STRIP CLOSURE SKIN 1/2X4 (GAUZE/BANDAGES/DRESSINGS) ×2 IMPLANT
SUT PROLENE 0 CT 1 30 (SUTURE) ×9 IMPLANT
SUT SURGILON 0 BLK (SUTURE) IMPLANT
SUT VIC AB 0 CT2 27 (SUTURE) ×3 IMPLANT
SUT VIC AB 3-0 54X BRD REEL (SUTURE) IMPLANT
SUT VIC AB 3-0 BRD 54 (SUTURE)
SUT VIC AB 3-0 SH 27 (SUTURE) ×2
SUT VIC AB 3-0 SH 27X BRD (SUTURE) ×1 IMPLANT
SUT VIC AB 4-0 FS2 27 (SUTURE) ×3 IMPLANT
SWABSTK COMLB BENZOIN TINCTURE (MISCELLANEOUS) ×3 IMPLANT
SYR 3ML LL SCALE MARK (SYRINGE) ×3 IMPLANT
SYR CONTROL 10ML (SYRINGE) IMPLANT
TROCAR XCEL BLUNT TIP 100MML (ENDOMECHANICALS) ×3 IMPLANT
TROCAR XCEL NON-BLD 11X100MML (ENDOMECHANICALS) ×3 IMPLANT
TUBING INSUFFLATOR HI FLOW (MISCELLANEOUS) ×3 IMPLANT
WATER STERILE IRR 1000ML POUR (IV SOLUTION) IMPLANT

## 2016-02-28 NOTE — Anesthesia Procedure Notes (Signed)
Procedure Name: Intubation Date/Time: 02/28/2016 9:04 AM Performed by: Justus Memory Pre-anesthesia Checklist: Patient identified, Emergency Drugs available, Suction available and Patient being monitored Patient Re-evaluated:Patient Re-evaluated prior to inductionOxygen Delivery Method: Circle system utilized Preoxygenation: Pre-oxygenation with 100% oxygen Intubation Type: IV induction Ventilation: Mask ventilation without difficulty Laryngoscope Size: Mac and 3 Grade View: Grade I Tube type: Oral Tube size: 7.0 mm Number of attempts: 1 Airway Equipment and Method: Stylet and Patient positioned with wedge pillow Placement Confirmation: ETT inserted through vocal cords under direct vision,  positive ETCO2,  CO2 detector and breath sounds checked- equal and bilateral Secured at: 20 cm Tube secured with: Tape Dental Injury: Teeth and Oropharynx as per pre-operative assessment

## 2016-02-28 NOTE — Anesthesia Postprocedure Evaluation (Signed)
Anesthesia Post Note  Patient: Martha Wade  Procedure(s) Performed: Procedure(s) (LRB): LAPAROSCOPIC CHOLECYSTECTOMY WITH INTRAOPERATIVE CHOLANGIOGRAM (N/A) HERNIA REPAIR UMBILICAL ADULT (N/A)  Patient location during evaluation: PACU Anesthesia Type: General Level of consciousness: awake and alert Pain management: pain level controlled Vital Signs Assessment: post-procedure vital signs reviewed and stable Respiratory status: spontaneous breathing, nonlabored ventilation, respiratory function stable and patient connected to nasal cannula oxygen Cardiovascular status: blood pressure returned to baseline and stable Postop Assessment: no signs of nausea or vomiting Anesthetic complications: no    Last Vitals:  Vitals:   02/28/16 1157 02/28/16 1212  BP: 117/74 125/82  Pulse: (!) 57 69  Resp: 12 16  Temp: 36.8 C 36.8 C    Last Pain:  Vitals:   02/28/16 1212  TempSrc: Oral  PainSc: 7                  Precious Haws Bayne Fosnaugh

## 2016-02-28 NOTE — Discharge Instructions (Signed)

## 2016-02-28 NOTE — Anesthesia Preprocedure Evaluation (Signed)
Anesthesia Evaluation  Patient identified by MRN, date of birth, ID band Patient awake    Reviewed: Allergy & Precautions, H&P , NPO status , Patient's Chart, lab work & pertinent test results  History of Anesthesia Complications Negative for: history of anesthetic complications  Airway Mallampati: II  TM Distance: >3 FB Neck ROM: limited    Dental no notable dental hx. (+) Poor Dentition, Chipped, Caps   Pulmonary neg pulmonary ROS, neg shortness of breath,    Pulmonary exam normal breath sounds clear to auscultation       Cardiovascular Exercise Tolerance: Good hypertension, (-) angina(-) Past MI Normal cardiovascular exam Rhythm:regular Rate:Normal     Neuro/Psych  Neuromuscular disease negative psych ROS   GI/Hepatic negative GI ROS, Neg liver ROS, neg GERD  ,  Endo/Other  negative endocrine ROS  Renal/GU Renal disease     Musculoskeletal   Abdominal   Peds  Hematology negative hematology ROS (+)   Anesthesia Other Findings Past Medical History: No date: Cervical radiculopathy     Comment: Dumonsky No date: Chronic kidney disease     Comment: RENAL INSUFF No date: CTS (carpal tunnel syndrome)     Comment: bilateral No date: Glaucoma     Comment: early stages (Dr. Wallace Going and Dr. Gloriann Loan) No date: Hypertension No date: Obesity (BMI [id number]) No date: Umbilical hernia  Past Surgical History: 06/08/2013: ANTERIOR CERVICAL DECOMP/DISCECTOMY FUSION N/A     Comment: ANTERIOR CERVICAL DECOMPRESSION/DISCECTOMY               FUSION 3 LEVELS;  Surgeon: Sinclair Ship, MD;  Anterior cervical decompression               fusion with instrumentation and allograft               (C4-7) No date: COLONOSCOPY W/ BIOPSIES AND POLYPECTOMY     Comment: no records with Dr Vira Agar 11/2012: DEXA     Comment: WNL No date: EYE SURGERY Left     Comment: laser  BMI    Body Mass Index:  32.95 kg/m       Reproductive/Obstetrics negative OB ROS                             Anesthesia Physical Anesthesia Plan  ASA: III  Anesthesia Plan: General ETT   Post-op Pain Management:    Induction:   Airway Management Planned:   Additional Equipment:   Intra-op Plan:   Post-operative Plan:   Informed Consent: I have reviewed the patients History and Physical, chart, labs and discussed the procedure including the risks, benefits and alternatives for the proposed anesthesia with the patient or authorized representative who has indicated his/her understanding and acceptance.     Plan Discussed with: Anesthesiologist, CRNA and Surgeon  Anesthesia Plan Comments:         Anesthesia Quick Evaluation

## 2016-02-28 NOTE — Transfer of Care (Signed)
Immediate Anesthesia Transfer of Care Note  Patient: Martha Wade  Procedure(s) Performed: Procedure(s): LAPAROSCOPIC CHOLECYSTECTOMY WITH INTRAOPERATIVE CHOLANGIOGRAM (N/A) HERNIA REPAIR UMBILICAL ADULT (N/A)  Patient Location: PACU  Anesthesia Type:General  Level of Consciousness: sedated  Airway & Oxygen Therapy: Patient Spontanous Breathing and Patient connected to face mask oxygen  Post-op Assessment: Report given to RN and Post -op Vital signs reviewed and stable  Post vital signs: Reviewed and stable  Last Vitals:  Vitals:   02/28/16 0725  BP: 140/84  Resp: 16  Temp: 36.8 C    Last Pain:  Vitals:   02/28/16 0725  TempSrc: Tympanic  PainSc: 0-No pain         Complications: No apparent anesthesia complications

## 2016-02-28 NOTE — H&P (Signed)
No change in clinical history or exam.  For umbilical hernia repair and cholecystectomy.

## 2016-02-28 NOTE — Op Note (Signed)
Preoperative diagnosis: 1) chronic cholecystitis and cholelithiasis; 2) umbilical hernia.  Postoperative diagnosis: 1) same, likely common bile duct stone, 2: Ventral hernia.  Operative procedure: Laparoscopic cholecystectomy with intraoperative cholangiograms, repair of ventral hernia.  Operating surgeon: Hervey Ard, M.D.  Anesthesia: Gen. endotracheal. Marcaine 0.5%, plain: 30 mL.  Assessment blood loss: Less than 10 mL.  Clinical note: This 60 year old woman has had a long-standing hernia, by family report since childhood. She has had several episodes of right upper quadrant pain suggestive of biliary colic and a suggestion of possible common bile duct stones. She was treated for elective cholecystectomy and ventral hernia repair. In the office the fascial defect of the umbilicus was felt to be an approximate 1.5 cm in diameter.  Operative note: The patient underwent general endotracheal anesthesia. A vertical incision was made beginning above the level of the umbilicus. Skin was incised sharply and the remaining dissection completed electrocautery. The patient was found to have a 5 cm long 3 cm wide fascial defect. The hernia sac was excised and discarded. The fascia was approximated with interrupted 0 Prolene figure-of-eight sutures. A Hassan cannula was passed through a gap and pneumoperitoneum established. No intra-abdominal adhesions were noted except from the omentum to the gallbladder. Insufflation was a 10 mmHg pressure. An 11 mm XL port was placed in the epigastrium after the patient was placed into reverse Trendelenburg position and rolled to the left. 2-5 mm Step ports were placed under direct vision in the right lateral abdominal wall. The gallbladder was noted to be moderately distended but not acutely inflamed. It was placed on cephalad traction. The neck of the gallbladder was identified and the cystic duct was fairly prominent at about 8 mm. This was cleared, fluoroscopic  cholangiograms were completed making use of a Kumar clamp. Initially 40 mL of one half strength Conray 60 was used. Faint opacification of the dilated extrahepatic biliary tree was noted with a suggestion of a trickle of contrast into the duodenum and a possible distal common duct stone. An additional 10 mL of full strength Conray 60 was used verifying these impressions. There was some flow, but there was a suggestion of at least some of the video images of a stone in the distal duct. The cystic duct was doubly clipped and a 0 chromic surgical loop tie placed. The cystic artery was totally clipped and divided. The gallbladder was removed from the liver bed making use of hook cautery dissection. It was then delivered through the [facility] cannula site. The right upper quadrant was irrigated with lactated Ringer solution. Good hemostasis was noted. Inspection from the epigastric site showed no evidence of injury from initial abdominal entry. The remaining fascial sutures were tied. The adipose tissue at the hernia repair site was approximated with a running 0 Vicryl suture. There was found to be significant redundant skin in an ellipse of skin 3 x 5 cm in diameter was removed. The dermal layer was then approximated with a running 3-0 Vicryl suture and the skin closed with a running 4-0 Vicryl subcuticular suture. Port sites were closed with a similar 4-0 Vicryl subcuticular suture. Benzoin and Steri-Strips followed by Telfa and Tegaderm dressings were applied.  The patient tolerated the procedure well and was taken to recovery in stable condition.

## 2016-02-29 LAB — SURGICAL PATHOLOGY

## 2016-03-10 ENCOUNTER — Encounter: Payer: Self-pay | Admitting: General Surgery

## 2016-03-10 ENCOUNTER — Ambulatory Visit (INDEPENDENT_AMBULATORY_CARE_PROVIDER_SITE_OTHER): Payer: BLUE CROSS/BLUE SHIELD | Admitting: General Surgery

## 2016-03-10 VITALS — BP 120/78 | HR 76 | Resp 12 | Ht 66.0 in | Wt 201.0 lb

## 2016-03-10 DIAGNOSIS — K429 Umbilical hernia without obstruction or gangrene: Secondary | ICD-10-CM

## 2016-03-10 NOTE — Progress Notes (Signed)
Patient ID: Martha Wade, female   DOB: 31-Oct-1955, 60 y.o.   MRN: [id number]  Chief Complaint  Patient presents with  . Routine Post Op    HPI Martha Wade is a 60 y.o. female here today for her post op lap chole and umbilical hernia repair done on 02/27/2013. Patient states she is doing well.  HPI  Past Medical History:  Diagnosis Date  . Cervical radiculopathy    Martha Wade  . Chronic kidney disease    RENAL INSUFF  . CTS (carpal tunnel syndrome)    bilateral  . Glaucoma    early stages (Dr. Wallace Going and Dr. Gloriann Loan)  . Hypertension   . Obesity (BMI 30.0-34.9)   . Umbilical hernia     Past Surgical History:  Procedure Laterality Date  . ANTERIOR CERVICAL DECOMP/DISCECTOMY FUSION N/A 06/08/2013   ANTERIOR CERVICAL DECOMPRESSION/DISCECTOMY FUSION 3 LEVELS;  Surgeon: Sinclair Ship, MD;  Anterior cervical decompression fusion with instrumentation and allograft (C4-7)  . CHOLECYSTECTOMY N/A 02/28/2016   Procedure: LAPAROSCOPIC CHOLECYSTECTOMY WITH INTRAOPERATIVE CHOLANGIOGRAM;  Surgeon: Robert Bellow, MD;  Location: [facility] ORS;  Service: General;  Laterality: N/A;  . COLONOSCOPY W/ BIOPSIES AND POLYPECTOMY     no records with Dr Vira Agar  . DEXA  11/2012   WNL  . EYE SURGERY Left    laser  . UMBILICAL HERNIA REPAIR N/A 02/28/2016   Procedure: PRIMARY REPAIR VENTRAL/ UMBILICAL ADULT;  Surgeon: Robert Bellow, MD;  Location: [facility] ORS;  Service: General;  Laterality: N/A;    Family History  Problem Relation Age of Onset  . Glaucoma Mother   . CAD Mother   . Diabetes Mother   . Stroke Mother   . Cancer Father     unsure type  . Hypertension Brother     and sister    Social History Social History  Substance Use Topics  . Smoking status: Never Smoker  . Smokeless tobacco: Never Used  . Alcohol use No    Allergies  Allergen Reactions  . Aleve [Naproxen Sodium] Hives, Itching and Swelling  . Citrus Swelling    Current Outpatient Prescriptions   Medication Sig Dispense Refill  . acetaminophen (TYLENOL) 500 MG tablet Take 1,000 mg by mouth daily.    Marland Kitchen amLODipine (NORVASC) 5 MG tablet TAKE 1 TABLET(5 MG) BY MOUTH DAILY 30 tablet 11  . Cholecalciferol (VITAMIN D-3) 1000 UNITS CAPS Take 1,000 Units by mouth daily.    . Coenzyme Q10 (COQ-10) 100 MG CAPS Take 100 mg by mouth daily.    . cyanocobalamin 500 MCG tablet Take 500 mcg by mouth daily.     . dorzolamide (TRUSOPT) 2 % ophthalmic solution PLACE 1 DROP INTO BOTH EYES TWICE A DAY  5  . fluticasone (FLONASE) 50 MCG/ACT nasal spray Place 2 sprays into both nostrils daily. 16 g 3  . HYDROcodone-acetaminophen (NORCO) 5-325 MG tablet Take 1-2 tablets by mouth every 4 (four) hours as needed for moderate pain. 30 tablet 0  . HYDROcodone-acetaminophen (NORCO) 5-325 MG tablet Take 1-2 tablets by mouth every 4 (four) hours as needed for moderate pain. 30 tablet 0  . latanoprost (XALATAN) 0.005 % ophthalmic solution   4  . ondansetron (ZOFRAN) 4 MG tablet Take 1 tablet (4 mg total) by mouth every 8 (eight) hours as needed for nausea or vomiting. 20 tablet 0  . timolol (TIMOPTIC) 0.5 % ophthalmic solution   4  . travoprost, benzalkonium, (TRAVATAN) 0.004 % ophthalmic solution Place 1 drop into both eyes at  bedtime.     No current facility-administered medications for this visit.     Review of Systems Review of Systems  Constitutional: Negative.   Respiratory: Negative.   Cardiovascular: Negative.     Blood pressure 120/78, pulse 76, resp. rate 12, height 5\' 6"  (1.676 m), weight 201 lb (91.2 kg), last menstrual period 11/20/2015.  Physical Exam Physical Exam  Constitutional: She is oriented to person, place, and time. She appears well-nourished.  Abdominal: Soft. There is no tenderness.    port sites are clean and healing well.   Neurological: She is alert and oriented to person, place, and time.  Skin: Skin is warm and dry.    Data Reviewed A. GALLBLADDER; CHOLECYSTECTOMY:  -  CHOLELITHIASIS AND CHRONIC CHOLECYSTITIS.  Intraoperative cholangiogram performed during the laparoscopic cholecystectomy. Mild diffuse dilatation of the biliary confluence, common hepatic duct, cystic duct, and common bile duct. Contrast does drain into the duodenum. No obstruction or filling defect.  IMPRESSION: Mild biliary dilatation without obstruction or definite filling Defect.   Post procedure liver function studies were entirely normal.  Intraoperative assessment raised a question of a distal common bile duct stone.    Assessment    Doing well status post primary repair of ventral hernia and elective cholecystectomy.    Plan    The patient will increase her activity as tolerated. Proper lifting technique reviewed.    Return in one month.   This information has been scribed by Gaspar Cola CMA.   Robert Bellow 03/10/2016, 3:51 PM

## 2016-03-10 NOTE — Patient Instructions (Addendum)
Return in one month.  

## 2016-03-27 ENCOUNTER — Encounter: Payer: Self-pay | Admitting: Family Medicine

## 2016-03-27 ENCOUNTER — Ambulatory Visit (INDEPENDENT_AMBULATORY_CARE_PROVIDER_SITE_OTHER): Payer: BLUE CROSS/BLUE SHIELD | Admitting: Family Medicine

## 2016-03-27 VITALS — BP 134/80 | HR 72 | Temp 97.4°F | Wt 202.0 lb

## 2016-03-27 DIAGNOSIS — H9202 Otalgia, left ear: Secondary | ICD-10-CM

## 2016-03-27 NOTE — Assessment & Plan Note (Addendum)
With crackling of L ear, now resolved. Exam today WNL - no bug, no wax, no foreign object. Pt reassured.

## 2016-03-27 NOTE — Progress Notes (Signed)
Pre visit review using our clinic review tool, if applicable. No additional management support is needed unless otherwise documented below in the visit note. 

## 2016-03-27 NOTE — Progress Notes (Signed)
   BP 134/80   Pulse 72   Temp 97.4 F (36.3 C) (Oral)   Wt 202 lb (91.6 kg)   LMP 11/20/2015   BMI 32.60 kg/m    CC: check L ear Subjective:    Patient ID: Martha Wade, female    DOB: 1955/11/09, 60 y.o.   MRN: [id number]  HPI: Martha Wade is a 60 y.o. female presenting on 03/27/2016 for Ear Problem ("crackling" sound x 2-3 weeks)   2-3 wk h/o crackling sound in left ear. Last week this resolved. Tried OTC ear wax removal. More noticeable at night time when laying on left side of head. No drainage, no headaches. Occasional sharp pain in ear.   Recent cholecystectomy for gallstones with umbilical hernia repair [facility]) - healing well  Relevant past medical, surgical, family and social history reviewed and updated as indicated. Interim medical history since our last visit reviewed. Allergies and medications reviewed and updated. Current Outpatient Prescriptions on File Prior to Visit  Medication Sig  . acetaminophen (TYLENOL) 500 MG tablet Take 1,000 mg by mouth daily.  Marland Kitchen amLODipine (NORVASC) 5 MG tablet TAKE 1 TABLET(5 MG) BY MOUTH DAILY  . Cholecalciferol (VITAMIN D-3) 1000 UNITS CAPS Take 1,000 Units by mouth daily.  . Coenzyme Q10 (COQ-10) 100 MG CAPS Take 100 mg by mouth daily.  . cyanocobalamin 500 MCG tablet Take 500 mcg by mouth daily.   . dorzolamide (TRUSOPT) 2 % ophthalmic solution PLACE 1 DROP INTO BOTH EYES TWICE A DAY  . fluticasone (FLONASE) 50 MCG/ACT nasal spray Place 2 sprays into both nostrils daily.  Marland Kitchen latanoprost (XALATAN) 0.005 % ophthalmic solution   . ondansetron (ZOFRAN) 4 MG tablet Take 1 tablet (4 mg total) by mouth every 8 (eight) hours as needed for nausea or vomiting.  . timolol (TIMOPTIC) 0.5 % ophthalmic solution   . travoprost, benzalkonium, (TRAVATAN) 0.004 % ophthalmic solution Place 1 drop into both eyes at bedtime.   No current facility-administered medications on file prior to visit.     Review of Systems Per HPI unless  specifically indicated in ROS section     Objective:    BP 134/80   Pulse 72   Temp 97.4 F (36.3 C) (Oral)   Wt 202 lb (91.6 kg)   LMP 11/20/2015   BMI 32.60 kg/m   Wt Readings from Last 3 Encounters:  03/27/16 202 lb (91.6 kg)  03/10/16 201 lb (91.2 kg)  02/28/16 198 lb (89.8 kg)    Physical Exam  Constitutional: She appears well-developed and well-nourished. No distress.  HENT:  Right Ear: Hearing, tympanic membrane, external ear and ear canal normal.  Left Ear: Hearing, tympanic membrane, external ear and ear canal normal.  Ear exam WNL  Nursing note and vitals reviewed.     Assessment & Plan:   Problem List Items Addressed This Visit    Discomfort of left ear - Primary    With crackling of L ear, now resolved. Exam today WNL - no bug, no wax, no foreign object. Pt reassured.           Follow up plan: No Follow-up on file.  Ria Bush, MD

## 2016-04-09 ENCOUNTER — Ambulatory Visit (INDEPENDENT_AMBULATORY_CARE_PROVIDER_SITE_OTHER): Payer: BLUE CROSS/BLUE SHIELD | Admitting: General Surgery

## 2016-04-09 ENCOUNTER — Encounter: Payer: Self-pay | Admitting: General Surgery

## 2016-04-09 VITALS — BP 124/78 | HR 74 | Resp 12 | Ht 66.0 in | Wt 203.0 lb

## 2016-04-09 DIAGNOSIS — K429 Umbilical hernia without obstruction or gangrene: Secondary | ICD-10-CM

## 2016-04-09 NOTE — Patient Instructions (Addendum)
Return as needed.Proper lifting techniques reviewed. 

## 2016-04-09 NOTE — Progress Notes (Signed)
Patient ID: Martha Wade, female   DOB: 07-Oct-1955, 60 y.o.   MRN: [id number]  Chief Complaint  Patient presents with  . Follow-up    HPI Martha Wade is a 60 y.o. female  here today for her post op lap chole and umbilical hernia repair done on 02/27/2013. Patient states she is doing well.  HPI HPI  Past Medical History:  Diagnosis Date  . Cervical radiculopathy    Dumonsky  . Chronic kidney disease    RENAL INSUFF  . CTS (carpal tunnel syndrome)    bilateral  . Glaucoma    early stages (Dr. Wallace Going and Dr. Gloriann Loan)  . Hypertension   . Obesity (BMI 30.0-34.9)   . Umbilical hernia     Past Surgical History:  Procedure Laterality Date  . ANTERIOR CERVICAL DECOMP/DISCECTOMY FUSION N/A 06/08/2013   ANTERIOR CERVICAL DECOMPRESSION/DISCECTOMY FUSION 3 LEVELS;  Surgeon: Sinclair Ship, MD;  Anterior cervical decompression fusion with instrumentation and allograft (C4-7)  . CHOLECYSTECTOMY N/A 02/28/2016   Procedure: LAPAROSCOPIC CHOLECYSTECTOMY WITH INTRAOPERATIVE CHOLANGIOGRAM;  Surgeon: Robert Bellow, MD;  Location: [facility] ORS;  Service: General;  Laterality: N/A;  . COLONOSCOPY W/ BIOPSIES AND POLYPECTOMY     no records with Dr Vira Agar  . DEXA  11/2012   WNL  . EYE SURGERY Left    laser  . UMBILICAL HERNIA REPAIR N/A 02/28/2016   Procedure: PRIMARY REPAIR VENTRAL/ UMBILICAL ADULT;  Surgeon: Robert Bellow, MD;  Location: [facility] ORS;  Service: General;  Laterality: N/A;    Family History  Problem Relation Age of Onset  . Glaucoma Mother   . CAD Mother   . Diabetes Mother   . Stroke Mother   . Cancer Father     unsure type  . Hypertension Brother     and sister    Social History Social History  Substance Use Topics  . Smoking status: Never Smoker  . Smokeless tobacco: Never Used  . Alcohol use No    Allergies  Allergen Reactions  . Aleve [Naproxen Sodium] Hives, Itching and Swelling  . Citrus Swelling    Current Outpatient Prescriptions   Medication Sig Dispense Refill  . acetaminophen (TYLENOL) 500 MG tablet Take 1,000 mg by mouth daily.    Marland Kitchen amLODipine (NORVASC) 5 MG tablet TAKE 1 TABLET(5 MG) BY MOUTH DAILY 30 tablet 11  . Cholecalciferol (VITAMIN D-3) 1000 UNITS CAPS Take 1,000 Units by mouth daily.    . Coenzyme Q10 (COQ-10) 100 MG CAPS Take 100 mg by mouth daily.    . cyanocobalamin 500 MCG tablet Take 500 mcg by mouth daily.     . dorzolamide (TRUSOPT) 2 % ophthalmic solution PLACE 1 DROP INTO BOTH EYES TWICE A DAY  5  . fluticasone (FLONASE) 50 MCG/ACT nasal spray Place 2 sprays into both nostrils daily. 16 g 3  . latanoprost (XALATAN) 0.005 % ophthalmic solution   4  . ondansetron (ZOFRAN) 4 MG tablet Take 1 tablet (4 mg total) by mouth every 8 (eight) hours as needed for nausea or vomiting. 20 tablet 0  . timolol (TIMOPTIC) 0.5 % ophthalmic solution   4  . travoprost, benzalkonium, (TRAVATAN) 0.004 % ophthalmic solution Place 1 drop into both eyes at bedtime.     No current facility-administered medications for this visit.     Review of Systems Review of Systems  Constitutional: Negative.   Respiratory: Negative.   Cardiovascular: Negative.     Blood pressure 124/78, pulse 74, resp. rate 12, height 5\' 6"  (  1.676 m), weight 203 lb (92.1 kg), last menstrual period 11/20/2015.  Physical Exam Physical Exam  Constitutional: She is oriented to person, place, and time. She appears well-developed and well-nourished.  Abdominal:    Neurological: She is alert and oriented to person, place, and time.  Skin: Skin is warm.    Data Reviewed Prominence of the common bile duct was noted time of intraoperative cholangiograms, with the patient has been asymptomatic post surgery.  Assessment    Doing well status post repair of ventral hernia, cholecystectomy.    Plan    Proper lifting technique was reviewed.  The patient can increase her activities as tolerated.    Return as needed. This information has been  scribed by Karie Fetch RN, BSN,BC.   Robert Bellow 04/09/2016, 10:09 PM

## 2016-04-09 NOTE — Progress Notes (Deleted)
Patient ID: Martha Wade, female   DOB: 01-02-1956, 60 y.o.   MRN: [id number]  Chief Complaint  Patient presents with  . Follow-up    HPI Martha Wade is a 60 y.o. female here today for her post op lap chole and umbilical hernia repair done on 02/27/2013. Patient states she is doing well.  HPI  Past Medical History:  Diagnosis Date  . Cervical radiculopathy    Dumonsky  . Chronic kidney disease    RENAL INSUFF  . CTS (carpal tunnel syndrome)    bilateral  . Glaucoma    early stages (Dr. Wallace Going and Dr. Gloriann Loan)  . Hypertension   . Obesity (BMI 30.0-34.9)   . Umbilical hernia     Past Surgical History:  Procedure Laterality Date  . ANTERIOR CERVICAL DECOMP/DISCECTOMY FUSION N/A 06/08/2013   ANTERIOR CERVICAL DECOMPRESSION/DISCECTOMY FUSION 3 LEVELS;  Surgeon: Sinclair Ship, MD;  Anterior cervical decompression fusion with instrumentation and allograft (C4-7)  . CHOLECYSTECTOMY N/A 02/28/2016   Procedure: LAPAROSCOPIC CHOLECYSTECTOMY WITH INTRAOPERATIVE CHOLANGIOGRAM;  Surgeon: Robert Bellow, MD;  Location: [facility] ORS;  Service: General;  Laterality: N/A;  . COLONOSCOPY W/ BIOPSIES AND POLYPECTOMY     no records with Dr Vira Agar  . DEXA  11/2012   WNL  . EYE SURGERY Left    laser  . UMBILICAL HERNIA REPAIR N/A 02/28/2016   Procedure: PRIMARY REPAIR VENTRAL/ UMBILICAL ADULT;  Surgeon: Robert Bellow, MD;  Location: [facility] ORS;  Service: General;  Laterality: N/A;    Family History  Problem Relation Age of Onset  . Glaucoma Mother   . CAD Mother   . Diabetes Mother   . Stroke Mother   . Cancer Father     unsure type  . Hypertension Brother     and sister    Social History Social History  Substance Use Topics  . Smoking status: Never Smoker  . Smokeless tobacco: Never Used  . Alcohol use No    Allergies  Allergen Reactions  . Aleve [Naproxen Sodium] Hives, Itching and Swelling  . Citrus Swelling    Current Outpatient Prescriptions  Medication  Sig Dispense Refill  . acetaminophen (TYLENOL) 500 MG tablet Take 1,000 mg by mouth daily.    Marland Kitchen amLODipine (NORVASC) 5 MG tablet TAKE 1 TABLET(5 MG) BY MOUTH DAILY 30 tablet 11  . Cholecalciferol (VITAMIN D-3) 1000 UNITS CAPS Take 1,000 Units by mouth daily.    . Coenzyme Q10 (COQ-10) 100 MG CAPS Take 100 mg by mouth daily.    . cyanocobalamin 500 MCG tablet Take 500 mcg by mouth daily.     . dorzolamide (TRUSOPT) 2 % ophthalmic solution PLACE 1 DROP INTO BOTH EYES TWICE A DAY  5  . fluticasone (FLONASE) 50 MCG/ACT nasal spray Place 2 sprays into both nostrils daily. 16 g 3  . latanoprost (XALATAN) 0.005 % ophthalmic solution   4  . ondansetron (ZOFRAN) 4 MG tablet Take 1 tablet (4 mg total) by mouth every 8 (eight) hours as needed for nausea or vomiting. 20 tablet 0  . timolol (TIMOPTIC) 0.5 % ophthalmic solution   4  . travoprost, benzalkonium, (TRAVATAN) 0.004 % ophthalmic solution Place 1 drop into both eyes at bedtime.     No current facility-administered medications for this visit.     Review of Systems Review of Systems  Constitutional: Negative.   Respiratory: Negative.   Cardiovascular: Negative.     Last menstrual period 11/20/2015.  Physical Exam Physical Exam  Constitutional: She is  oriented to person, place, and time. She appears well-developed and well-nourished.  Cardiovascular: Normal rate, regular rhythm and normal heart sounds.   Pulmonary/Chest: Effort normal and breath sounds normal.  Neurological: She is alert and oriented to person, place, and time.  Skin: Skin is warm and dry.    Data Reviewed ***  Assessment    ***    Plan    ***      This information has been scribed by Gaspar Cola CMA.  Gaspar Cola 04/09/2016, 1:15 PM

## 2016-04-15 ENCOUNTER — Ambulatory Visit: Payer: BLUE CROSS/BLUE SHIELD | Admitting: Family Medicine

## 2016-04-24 ENCOUNTER — Ambulatory Visit: Payer: BLUE CROSS/BLUE SHIELD | Admitting: Family Medicine

## 2016-04-28 ENCOUNTER — Ambulatory Visit: Payer: BLUE CROSS/BLUE SHIELD | Admitting: Family Medicine

## 2016-06-28 ENCOUNTER — Other Ambulatory Visit: Payer: Self-pay | Admitting: Family Medicine

## 2016-10-05 ENCOUNTER — Other Ambulatory Visit: Payer: Self-pay | Admitting: Family Medicine

## 2016-10-05 DIAGNOSIS — E785 Hyperlipidemia, unspecified: Secondary | ICD-10-CM

## 2016-10-05 DIAGNOSIS — E559 Vitamin D deficiency, unspecified: Secondary | ICD-10-CM

## 2016-10-05 DIAGNOSIS — N289 Disorder of kidney and ureter, unspecified: Secondary | ICD-10-CM

## 2016-10-07 ENCOUNTER — Other Ambulatory Visit: Payer: BLUE CROSS/BLUE SHIELD

## 2016-10-10 ENCOUNTER — Encounter: Payer: BLUE CROSS/BLUE SHIELD | Admitting: Family Medicine

## 2016-11-04 ENCOUNTER — Other Ambulatory Visit: Payer: Self-pay

## 2016-11-10 ENCOUNTER — Encounter: Payer: BLUE CROSS/BLUE SHIELD | Admitting: Family Medicine

## 2016-11-21 ENCOUNTER — Other Ambulatory Visit: Payer: Self-pay | Admitting: Family Medicine

## 2016-11-21 NOTE — Telephone Encounter (Signed)
Last office visit 03/27/2016.  Diclofenac not on current medication list.  Refill?

## 2017-05-12 HISTORY — PX: CATARACT EXTRACTION, BILATERAL: SHX1313

## 2017-08-21 ENCOUNTER — Encounter: Payer: Self-pay | Admitting: Family Medicine

## 2017-08-21 ENCOUNTER — Ambulatory Visit: Payer: BC Managed Care – PPO | Admitting: Family Medicine

## 2017-08-21 VITALS — BP 128/74 | HR 73 | Temp 98.1°F | Ht 66.0 in | Wt 210.0 lb

## 2017-08-21 DIAGNOSIS — M545 Low back pain, unspecified: Secondary | ICD-10-CM | POA: Insufficient documentation

## 2017-08-21 MED ORDER — DICLOFENAC SODIUM 75 MG PO TBEC: 75.0000 mg | DELAYED_RELEASE_TABLET | Freq: Two times a day (BID) | ORAL | 0 refills | Status: DC

## 2017-08-21 MED ORDER — METHOCARBAMOL 500 MG PO TABS: 500.0000 mg | ORAL_TABLET | Freq: Three times a day (TID) | ORAL | 0 refills | Status: DC | PRN

## 2017-08-21 NOTE — Patient Instructions (Addendum)
I think you have lumbar sprain or irritation/inflammation of ligaments of lower spine. This should get better with time and conservative treatment.  Try to do some walking daily.  Prescription anti inflammatory (diclofenac) and muscle relaxant.  Do exercises provided today.  May use ice or heating pad to lower back as tolerated.  Let us know if not improving - next step may be physical therapy. Return at your convenience ofr physical.

## 2017-08-21 NOTE — Assessment & Plan Note (Signed)
Anticipate lumbar strain. Benign exam. Supportive care reviewed. Treat with robaxin, diclofenac (pt has tolerated well in the past), ice/heat, walking, and rest. Update if not improving for further eval, consider PT. Lower back exercises provided from [facility] pt advisor. Pt agrees with plan.

## 2017-08-21 NOTE — Progress Notes (Signed)
BP 128/74 (BP Location: Left Arm, Patient Position: Sitting, Cuff Size: Normal)   Pulse 73   Temp 98.1 F (36.7 C) (Oral)   Ht 5\' 6"  (1.676 m)   Wt 210 lb (95.3 kg)   LMP 11/20/2015   SpO2 95%   BMI 33.89 kg/m    CC: lower back pain Subjective:    Patient ID: Martha Wade, female    DOB: November 06, 1955, 62 y.o.   MRN: [id number]  HPI: Martha Wade is a 62 y.o. female presenting on 08/21/2017 for Back Pain (Located in mid lower back. Started about 2 wks ago. Tried Tylenol, barely helpful. )   2+ wk h/o worsening midline lower back pain - denies inciting trauma/injury or falls. Twisting and bending worsens pain. Pain meds (tylenol, advil) helps temporarily.   Denies fevers/chills, shooting pain down legs or numbness/weakness, bladder or bowel incontinence, saddle anesthesia.   Last year she did have a fall onto back on black ice (~04/2017). Didn't really injure back at that time.   H/o ACDF 2015 from known cervical radiculopathy.   Relevant past medical, surgical, family and social history reviewed and updated as indicated. Interim medical history since our last visit reviewed. Allergies and medications reviewed and updated. Outpatient Medications Prior to Visit  Medication Sig Dispense Refill  . acetaminophen (TYLENOL) 500 MG tablet Take 1,000 mg by mouth daily.    Marland Kitchen amLODipine (NORVASC) 5 MG tablet TAKE 1 TABLET(5 MG) BY MOUTH DAILY 30 tablet 11  . amLODipine (NORVASC) 5 MG tablet TAKE 1 TABLET(5 MG) BY MOUTH DAILY 30 tablet 5  . Cholecalciferol (VITAMIN D-3) 1000 UNITS CAPS Take 1,000 Units by mouth daily.    . Coenzyme Q10 (COQ-10) 100 MG CAPS Take 100 mg by mouth daily.    . cyanocobalamin 500 MCG tablet Take 500 mcg by mouth daily.     . dorzolamide (TRUSOPT) 2 % ophthalmic solution PLACE 1 DROP INTO BOTH EYES TWICE A DAY  5  . fluticasone (FLONASE) 50 MCG/ACT nasal spray Place 2 sprays into both nostrils daily. 16 g 3  . latanoprost (XALATAN) 0.005 % ophthalmic solution    4  . ondansetron (ZOFRAN) 4 MG tablet Take 1 tablet (4 mg total) by mouth every 8 (eight) hours as needed for nausea or vomiting. 20 tablet 0  . timolol (TIMOPTIC) 0.5 % ophthalmic solution   4  . travoprost, benzalkonium, (TRAVATAN) 0.004 % ophthalmic solution Place 1 drop into both eyes at bedtime.    . diclofenac (VOLTAREN) 75 MG EC tablet TAKE 1 TABLET(75 MG) BY MOUTH TWICE DAILY 30 tablet 0   No facility-administered medications prior to visit.      Per HPI unless specifically indicated in ROS section below Review of Systems     Objective:    BP 128/74 (BP Location: Left Arm, Patient Position: Sitting, Cuff Size: Normal)   Pulse 73   Temp 98.1 F (36.7 C) (Oral)   Ht 5\' 6"  (1.676 m)   Wt 210 lb (95.3 kg)   LMP 11/20/2015   SpO2 95%   BMI 33.89 kg/m   Wt Readings from Last 3 Encounters:  08/21/17 210 lb (95.3 kg)  04/09/16 203 lb (92.1 kg)  03/27/16 202 lb (91.6 kg)    Physical Exam  Constitutional: She appears well-developed and well-nourished. No distress.  Musculoskeletal: She exhibits no edema.  Discomfort midline lumbar spine No paraspinous mm tenderness Neg SLR bilaterally. No pain with int/ext rotation at hip. Neg FABER. No pain at SIJ, GTB  or sciatic notch bilaterally.   Neurological: She is alert.  5/5 strength BLE  Skin: Skin is warm and dry.  Psychiatric: She has a normal mood and affect.  Nursing note and vitals reviewed.  Lab Results  Component Value Date   CREATININE 1.17 01/24/2016       Assessment & Plan:   Problem List Items Addressed This Visit    Lower back pain - Primary    Anticipate lumbar strain. Benign exam. Supportive care reviewed. Treat with robaxin, diclofenac (pt has tolerated well in the past), ice/heat, walking, and rest. Update if not improving for further eval, consider PT. Lower back exercises provided from [facility] pt advisor. Pt agrees with plan.       Relevant Medications   diclofenac (VOLTAREN) 75 MG EC tablet    methocarbamol (ROBAXIN) 500 MG tablet       Meds ordered this encounter  Medications  . diclofenac (VOLTAREN) 75 MG EC tablet    Sig: Take 1 tablet (75 mg total) by mouth 2 (two) times daily. For 5 days then as needed    Dispense:  30 tablet    Refill:  0  . methocarbamol (ROBAXIN) 500 MG tablet    Sig: Take 1 tablet (500 mg total) by mouth 3 (three) times daily as needed for muscle spasms.    Dispense:  40 tablet    Refill:  0   No orders of the defined types were placed in this encounter.   Follow up plan: No follow-ups on file.  Ria Bush, MD

## 2017-10-07 ENCOUNTER — Ambulatory Visit: Payer: BC Managed Care – PPO | Admitting: Family Medicine

## 2017-10-08 ENCOUNTER — Ambulatory Visit: Payer: BC Managed Care – PPO | Admitting: Family Medicine

## 2017-10-08 ENCOUNTER — Encounter: Payer: Self-pay | Admitting: Family Medicine

## 2017-10-08 VITALS — BP 136/82 | HR 69 | Temp 98.2°F | Ht 66.0 in | Wt 212.5 lb

## 2017-10-08 DIAGNOSIS — I10 Essential (primary) hypertension: Secondary | ICD-10-CM | POA: Diagnosis not present

## 2017-10-08 DIAGNOSIS — E559 Vitamin D deficiency, unspecified: Secondary | ICD-10-CM | POA: Diagnosis not present

## 2017-10-08 DIAGNOSIS — E785 Hyperlipidemia, unspecified: Secondary | ICD-10-CM

## 2017-10-08 LAB — COMPREHENSIVE METABOLIC PANEL
ALT: 20 U/L (ref 0–35)
AST: 14 U/L (ref 0–37)
Albumin: 3.9 g/dL (ref 3.5–5.2)
Alkaline Phosphatase: 60 U/L (ref 39–117)
BILIRUBIN TOTAL: 0.4 mg/dL (ref 0.2–1.2)
BUN: 12 mg/dL (ref 6–23)
CO2: 29 meq/L (ref 19–32)
CREATININE: 1.12 mg/dL (ref 0.40–1.20)
Calcium: 9.4 mg/dL (ref 8.4–10.5)
Chloride: 103 mEq/L (ref 96–112)
GFR: 63.62 mL/min (ref >60.00)
GLUCOSE: 113 mg/dL — AB (ref 70–99)
Potassium: 3.9 mEq/L (ref 3.5–5.1)
SODIUM: 137 meq/L (ref 135–145)
Total Protein: 8.1 g/dL (ref 6.0–8.3)

## 2017-10-08 LAB — LIPID PANEL
CHOL/HDL RATIO: 5
Cholesterol: 218 mg/dL — ABNORMAL HIGH (ref 0–200)
HDL: 46.2 mg/dL (ref >39.00)
LDL Cholesterol: 148 mg/dL — ABNORMAL HIGH (ref 0–99)
NONHDL: 171.84
Triglycerides: 119 mg/dL (ref 0.0–149.0)
VLDL: 23.8 mg/dL (ref 0.0–40.0)

## 2017-10-08 LAB — TSH: TSH: 2.62 u[IU]/mL (ref 0.35–4.50)

## 2017-10-08 LAB — VITAMIN D 25 HYDROXY (VIT D DEFICIENCY, FRACTURES): VITD: 31.91 ng/mL (ref 30.00–100.00)

## 2017-10-08 NOTE — Patient Instructions (Addendum)
EKG today Labs today Continue monitoring blood pressures at home, and jot down readings to keep log to review next week at physical.  Try to back off caffeine, make sure to stay well hydrated.  Take amlodipine 5mg  daily, monitor ankle swelling with more regular use.  Try Funginail laquer for toenails. We may do oral medicine - pending labs.
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
## 2017-10-08 NOTE — Assessment & Plan Note (Addendum)
Chronic, stable. However, endorses elevated readings at home associated with rapid heart rate, but no palpitations or arrhythmias - doubt afib. Check EKG today. Encouraged good hydration, avoiding high amts caffeine. I did ask her to start monitoring blood pressures and heart rates at home and will review in 1 wk at CPE  Leg heaviness may be coming from amlodipine (ankle swelling) or from chronic back pain.

## 2017-10-16 ENCOUNTER — Encounter: Payer: Self-pay | Admitting: Family Medicine

## 2017-10-16 ENCOUNTER — Ambulatory Visit (INDEPENDENT_AMBULATORY_CARE_PROVIDER_SITE_OTHER): Payer: BC Managed Care – PPO | Admitting: Family Medicine

## 2017-10-16 VITALS — BP 122/82 | HR 70 | Temp 98.3°F | Ht 65.5 in | Wt 209.8 lb

## 2017-10-16 DIAGNOSIS — N950 Postmenopausal bleeding: Secondary | ICD-10-CM | POA: Diagnosis not present

## 2017-10-16 DIAGNOSIS — E669 Obesity, unspecified: Secondary | ICD-10-CM | POA: Diagnosis not present

## 2017-10-16 DIAGNOSIS — I10 Essential (primary) hypertension: Secondary | ICD-10-CM | POA: Diagnosis not present

## 2017-10-16 DIAGNOSIS — E559 Vitamin D deficiency, unspecified: Secondary | ICD-10-CM | POA: Diagnosis not present

## 2017-10-16 DIAGNOSIS — Z0000 Encounter for general adult medical examination without abnormal findings: Secondary | ICD-10-CM | POA: Diagnosis not present

## 2017-10-16 DIAGNOSIS — E785 Hyperlipidemia, unspecified: Secondary | ICD-10-CM

## 2017-10-16 DIAGNOSIS — B351 Tinea unguium: Secondary | ICD-10-CM

## 2017-10-16 MED ORDER — AMLODIPINE BESYLATE 5 MG PO TABS: 5.0000 mg | ORAL_TABLET | Freq: Every day | ORAL | 3 refills | Status: DC

## 2017-10-16 MED ORDER — TERBINAFINE HCL 250 MG PO TABS: 250.0000 mg | ORAL_TABLET | Freq: Every day | ORAL | 0 refills | Status: DC

## 2017-10-16 MED ORDER — METHOCARBAMOL 500 MG PO TABS: 500.0000 mg | ORAL_TABLET | Freq: Every evening | ORAL | 1 refills | Status: DC | PRN

## 2017-10-16 NOTE — Assessment & Plan Note (Signed)
LFTs normal - will Rx 6 wk terbinafine course.

## 2017-10-16 NOTE — Assessment & Plan Note (Signed)
Chronic, stable. Continue current regimen. 

## 2017-10-16 NOTE — Assessment & Plan Note (Signed)
Encouraged healthy diet and lifestyle changes to affect sustainable weight loss.  

## 2017-10-16 NOTE — Assessment & Plan Note (Signed)
Chronic off medication. The 10-year ASCVD risk score [name]., et al., 2013) is: 7.1%   Values used to calculate the score:     Age: 62 years     Sex: Female     Is Non-Hispanic African American: Yes     Diabetic: No     Tobacco smoker: No     Systolic Blood Pressure: 341 mmHg     Is BP treated: Yes     HDL Cholesterol: 46.2 mg/dL     Total Cholesterol: 218 mg/dL

## 2017-10-16 NOTE — Assessment & Plan Note (Signed)
Preventative protocols reviewed and updated unless pt declined. Discussed healthy diet and lifestyle.  

## 2017-10-16 NOTE — Assessment & Plan Note (Addendum)
Has not been regular with 1000 IU vit D. Has restarted.

## 2017-10-16 NOTE — Patient Instructions (Addendum)
For recent bleed, I recommend we see GYN - referred today.  You will be due for colonoscopy 03/2018 - if you don't receive a letter from GI, let us or them know.  Call and schedule mammogram at [facility].  If interested, check with pharmacy about new 2 shot shingles series (shingrix).  May take terbinafine 231m once daily for toenail - 6 week course.   Health Maintenance, Female Adopting a healthy lifestyle and getting preventive care can go a long way to promote health and wellness. Talk with your health care provider about what schedule of regular examinations is right for you. This is a good chance for you to check in with your provider about disease prevention and staying healthy. In between checkups, there are plenty of things you can do on your own. Experts have done a lot of research about which lifestyle changes and preventive measures are most likely to keep you healthy. Ask your health care provider for more information. Weight and diet Eat a healthy diet  Be sure to include plenty of vegetables, fruits, low-fat dairy products, and lean protein.  Do not eat a lot of foods high in solid fats, added sugars, or salt.  Get regular exercise. This is one of the most important things you can do for your health. ? Most adults should exercise for at least 150 minutes each week. The exercise should increase your heart rate and make you sweat (moderate-intensity exercise). ? Most adults should also do strengthening exercises at least twice a week. This is in addition to the moderate-intensity exercise.  Maintain a healthy weight  Body mass index (BMI) is a measurement that can be used to identify possible weight problems. It estimates body fat based on height and weight. Your health care provider can help determine your BMI and help you achieve or maintain a healthy weight.  For females 247years of age and older: ? A BMI below 18.5 is considered underweight. ? A BMI of 18.5 to 24.9 is  normal. ? A BMI of 25 to 29.9 is considered overweight. ? A BMI of 30 and above is considered obese.  Watch levels of cholesterol and blood lipids  You should start having your blood tested for lipids and cholesterol at 61years of age, then have this test every 5 years.  You may need to have your cholesterol levels checked more often if: ? Your lipid or cholesterol levels are high. ? You are older than 61years of age. ? You are at high risk for heart disease.  Cancer screening Lung Cancer  Lung cancer screening is recommended for adults [id number] old who are at high risk for lung cancer because of a history of smoking.  A yearly low-dose CT scan of the lungs is recommended for people who: ? Currently smoke. ? Have quit within the past 15 years. ? Have at least a 30-pack-year history of smoking. A pack year is smoking an average of one pack of cigarettes a day for 1 year.  Yearly screening should continue until it has been 15 years since you quit.  Yearly screening should stop if you develop a health problem that would prevent you from having lung cancer treatment.  Breast Cancer  Practice breast self-awareness. This means understanding how your breasts normally appear and feel.  It also means doing regular breast self-exams. Let your health care provider know about any changes, no matter how small.  If you are in your 20s or 30s, you should  have a clinical breast exam (CBE) by a health care provider every 1-3 years as part of a regular health exam.  If you are 1 or older, have a CBE every year. Also consider having a breast X-ray (mammogram) every year.  If you have a family history of breast cancer, talk to your health care provider about genetic screening.  If you are at high risk for breast cancer, talk to your health care provider about having an MRI and a mammogram every year.  Breast cancer gene (BRCA) assessment is recommended for women who have family members  with BRCA-related cancers. BRCA-related cancers include: ? Breast. ? Ovarian. ? Tubal. ? Peritoneal cancers.  Results of the assessment will determine the need for genetic counseling and BRCA1 and BRCA2 testing.  Cervical Cancer Your health care provider may recommend that you be screened regularly for cancer of the pelvic organs (ovaries, uterus, and vagina). This screening involves a pelvic examination, including checking for microscopic changes to the surface of your cervix (Pap test). You may be encouraged to have this screening done every 3 years, beginning at age 68.  For women ages 40-65, health care providers may recommend pelvic exams and Pap testing every 3 years, or they may recommend the Pap and pelvic exam, combined with testing for human papilloma virus (HPV), every 5 years. Some types of HPV increase your risk of cervical cancer. Testing for HPV may also be done on women of any age with unclear Pap test results.  Other health care providers may not recommend any screening for nonpregnant women who are considered low risk for pelvic cancer and who do not have symptoms. Ask your health care provider if a screening pelvic exam is right for you.  If you have had past treatment for cervical cancer or a condition that could lead to cancer, you need Pap tests and screening for cancer for at least 20 years after your treatment. If Pap tests have been discontinued, your risk factors (such as having a new sexual partner) need to be reassessed to determine if screening should resume. Some women have medical problems that increase the chance of getting cervical cancer. In these cases, your health care provider may recommend more frequent screening and Pap tests.  Colorectal Cancer  This type of cancer can be detected and often prevented.  Routine colorectal cancer screening usually begins at 62 years of age and continues through 61 years of age.  Your health care provider may recommend  screening at an earlier age if you have risk factors for colon cancer.  Your health care provider may also recommend using home test kits to check for hidden blood in the stool.  A small camera at the end of a tube can be used to examine your colon directly (sigmoidoscopy or colonoscopy). This is done to check for the earliest forms of colorectal cancer.  Routine screening usually begins at age 51.  Direct examination of the colon should be repeated every 5-10 years through 61 years of age. However, you may need to be screened more often if early forms of precancerous polyps or small growths are found.  Skin Cancer  Check your skin from head to toe regularly.  Tell your health care provider about any new moles or changes in moles, especially if there is a change in a mole's shape or color.  Also tell your health care provider if you have a mole that is larger than the size of a pencil eraser.  Always  use sunscreen. Apply sunscreen liberally and repeatedly throughout the day.  Protect yourself by wearing long sleeves, pants, a wide-brimmed hat, and sunglasses whenever you are outside.  Heart disease, diabetes, and high blood pressure  High blood pressure causes heart disease and increases the risk of stroke. High blood pressure is more likely to develop in: ? People who have blood pressure in the high end of the normal range (130-139/85-89 mm Hg). ? People who are overweight or obese. ? People who are African American.  If you are 71-70 years of age, have your blood pressure checked every 3-5 years. If you are 37 years of age or older, have your blood pressure checked every year. You should have your blood pressure measured twice-once when you are at a hospital or clinic, and once when you are not at a hospital or clinic. Record the average of the two measurements. To check your blood pressure when you are not at a hospital or clinic, you can use: ? An automated blood pressure machine at  a pharmacy. ? A home blood pressure monitor.  If you are between 7 years and 26 years old, ask your health care provider if you should take aspirin to prevent strokes.  Have regular diabetes screenings. This involves taking a blood sample to check your fasting blood sugar level. ? If you are at a normal weight and have a low risk for diabetes, have this test once every three years after 61 years of age. ? If you are overweight and have a high risk for diabetes, consider being tested at a younger age or more often. Preventing infection Hepatitis B  If you have a higher risk for hepatitis B, you should be screened for this virus. You are considered at high risk for hepatitis B if: ? You were born in a country where hepatitis B is common. Ask your health care provider which countries are considered high risk. ? Your parents were born in a high-risk country, and you have not been immunized against hepatitis B (hepatitis B vaccine). ? You have HIV or AIDS. ? You use needles to inject street drugs. ? You live with someone who has hepatitis B. ? You have had sex with someone who has hepatitis B. ? You get hemodialysis treatment. ? You take certain medicines for conditions, including cancer, organ transplantation, and autoimmune conditions.  Hepatitis C  Blood testing is recommended for: ? Everyone born from [birth year] through 1964. ? Anyone with known risk factors for hepatitis C.  Sexually transmitted infections (STIs)  You should be screened for sexually transmitted infections (STIs) including gonorrhea and chlamydia if: ? You are sexually active and are younger than 61 years of age. ? You are older than 61 years of age and your health care provider tells you that you are at risk for this type of infection. ? Your sexual activity has changed since you were last screened and you are at an increased risk for chlamydia or gonorrhea. Ask your health care provider if you are at risk.  If you do  not have HIV, but are at risk, it may be recommended that you take a prescription medicine daily to prevent HIV infection. This is called pre-exposure prophylaxis (PrEP). You are considered at risk if: ? You are sexually active and do not regularly use condoms or know the HIV status of your partner(s). ? You take drugs by injection. ? You are sexually active with a partner who has HIV.  Talk with your  health care provider about whether you are at high risk of being infected with HIV. If you choose to begin PrEP, you should first be tested for HIV. You should then be tested every 3 months for as long as you are taking PrEP. Pregnancy  If you are premenopausal and you may become pregnant, ask your health care provider about preconception counseling.  If you may become pregnant, take 400 to 800 micrograms (mcg) of folic acid every day.  If you want to prevent pregnancy, talk to your health care provider about birth control (contraception). Osteoporosis and menopause  Osteoporosis is a disease in which the bones lose minerals and strength with aging. This can result in serious bone fractures. Your risk for osteoporosis can be identified using a bone density scan.  If you are 67 years of age or older, or if you are at risk for osteoporosis and fractures, ask your health care provider if you should be screened.  Ask your health care provider whether you should take a calcium or vitamin D supplement to lower your risk for osteoporosis.  Menopause may have certain physical symptoms and risks.  Hormone replacement therapy may reduce some of these symptoms and risks. Talk to your health care provider about whether hormone replacement therapy is right for you. Follow these instructions at home:  Schedule regular health, dental, and eye exams.  Stay current with your immunizations.  Do not use any tobacco products including cigarettes, chewing tobacco, or electronic cigarettes.  If you are  pregnant, do not drink alcohol.  If you are breastfeeding, limit how much and how often you drink alcohol.  Limit alcohol intake to no more than 1 drink per day for nonpregnant women. One drink equals 12 ounces of beer, 5 ounces of wine, or 1 ounces of hard liquor.  Do not use street drugs.  Do not share needles.  Ask your health care provider for help if you need support or information about quitting drugs.  Tell your health care provider if you often feel depressed.  Tell your health care provider if you have ever been abused or do not feel safe at home. This information is not intended to replace advice given to you by your health care provider. Make sure you discuss any questions you have with your health care provider. Document Released: 11/11/2010 Document Revised: 10/04/2015 Document Reviewed: 01/30/2015 Elsevier Interactive Patient Education  [employer].

## 2017-10-16 NOTE — Progress Notes (Signed)
BP 122/82 (BP Location: Left Arm, Patient Position: Sitting, Cuff Size: Large)   Pulse 70   Temp 98.3 F (36.8 C) (Oral)   Ht 5' 5.5" (1.664 m)   Wt 209 lb 12 oz (95.1 kg)   LMP 11/20/2015   SpO2 96%   BMI 34.37 kg/m    CC: CPE Subjective:    Patient ID: Martha Wade, female    DOB: [birth year], 62 y.o.   MRN: [id number]  HPI: Martha Wade is a 62 y.o. female presenting on 10/16/2017 for Annual Exam (Started menses 10/09/17. )   Overall feeling well. Home BP 142/100 (10pm), 128/82 (4pm), 144/88 (10pm), 122/87.  Working on healthy diet changes , 3 lb weight loss!   LMP >1 yr. Then had sudden bleed 10/09/2017 - heavy a few days then slowed down - similar to prior periods.   Preventative: Colonoscopy at [facility] with polyps, rec rpt 5 yrs 2014 Vira Agar).  Well woman - last pap 09/2015 - WNL except endocervical zone absent. Due. See above - will refer to GYN.  Mammogram - 6/20217 Birads1  DEXA 11/2012 - WNL  Flu shot - declines Tdap 12/2012 Shingrix - discussed Seat belt use discussed Sunscreen use discussed, no changing moles on skin.  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 Interim medical history since our last visit reviewed. Allergies and medications reviewed and updated. Outpatient Medications Prior to Visit  Medication Sig Dispense Refill  . acetaminophen (TYLENOL) 500 MG tablet Take 1,000 mg by mouth daily.    . brimonidine (ALPHAGAN) 0.2 % ophthalmic solution     . Cholecalciferol (VITAMIN D-3) 1000 UNITS CAPS Take 1,000 Units by mouth daily.    . Coenzyme Q10 (COQ-10)  100 MG CAPS Take 100 mg by mouth daily.    . cyanocobalamin 500 MCG tablet Take 500 mcg by mouth daily.     . diclofenac (VOLTAREN) 75 MG EC tablet Take 1 tablet (75 mg total) by mouth 2 (two) times daily. For 5 days then as needed 30 tablet 0  . dorzolamide (TRUSOPT) 2 % ophthalmic solution PLACE 1 DROP INTO BOTH EYES TWICE A DAY  5  . latanoprost (XALATAN) 0.005 % ophthalmic solution   4  . ondansetron (ZOFRAN) 4 MG tablet Take 1 tablet (4 mg total) by mouth every 8 (eight) hours as needed for nausea or vomiting. 20 tablet 0  . timolol (TIMOPTIC) 0.5 % ophthalmic solution   4  . amLODipine (NORVASC) 5 MG tablet TAKE 1 TABLET(5 MG) BY MOUTH DAILY 30 tablet 5   No facility-administered medications prior to visit.      Per HPI unless specifically indicated in ROS section below Review of Systems  Constitutional: Negative for activity change, appetite change, chills, fatigue, fever and unexpected weight change.  HENT: Negative for hearing loss.   Eyes: Negative for visual disturbance.  Respiratory: Positive for wheezing. Negative for cough, chest tightness and shortness of breath.   Cardiovascular: Negative for chest pain, palpitations and leg swelling.  Gastrointestinal: Negative for abdominal distention, abdominal pain, blood in stool, constipation, diarrhea, nausea and vomiting.  Genitourinary: Negative for difficulty urinating and hematuria.  Musculoskeletal:  Negative for arthralgias, myalgias and neck pain.  Skin: Negative for rash.  Neurological: Negative for dizziness, seizures, syncope and headaches.  Hematological: Negative for adenopathy. Does not bruise/bleed easily.  Psychiatric/Behavioral: Negative for dysphoric mood. The patient is not nervous/anxious.        Objective:    BP 122/82 (BP Location: Left Arm, Patient Position: Sitting, Cuff Size: Large)   Pulse 70   Temp 98.3 F (36.8 C) (Oral)   Ht 5' 5.5" (1.664 m)   Wt 209 lb 12 oz (95.1 kg)   LMP 11/20/2015   SpO2  96%   BMI 34.37 kg/m   Wt Readings from Last 3 Encounters:  10/16/17 209 lb 12 oz (95.1 kg)  10/08/17 212 lb 8 oz (96.4 kg)  08/21/17 210 lb (95.3 kg)    Physical Exam  Constitutional: She is oriented to person, place, and time. She appears well-developed and well-nourished. No distress.  HENT:  Head: Normocephalic and atraumatic.  Right Ear: Hearing, tympanic membrane, external ear and ear canal normal.  Left Ear: Hearing, tympanic membrane, external ear and ear canal normal.  Nose: Nose normal.  Mouth/Throat: Uvula is midline, oropharynx is clear and moist and mucous membranes are normal. No oropharyngeal exudate, posterior oropharyngeal edema or posterior oropharyngeal erythema.  Eyes: Pupils are equal, round, and reactive to light. Conjunctivae and EOM are normal. No scleral icterus.  Neck: Normal range of motion. Neck supple. No thyromegaly present.  Cardiovascular: Normal rate, regular rhythm, normal heart sounds and intact distal pulses.  No murmur heard. Pulses:      Radial pulses are 2+ on the right side, and 2+ on the left side.  Pulmonary/Chest: Effort normal and breath sounds normal. No respiratory distress. She has no wheezes. She has no rales.  Abdominal: Soft. Bowel sounds are normal. She exhibits no distension and no mass. There is no tenderness. There is no rebound and no guarding.  Musculoskeletal: Normal range of motion. She exhibits no edema.  Lymphadenopathy:    She has no cervical adenopathy.  Neurological: She is alert and oriented to person, place, and time.  CN grossly intact, station and gait intact  Skin: Skin is warm and dry. No rash noted.  Psychiatric: She has a normal mood and affect. Her behavior is normal. Judgment and thought content normal.  Nursing note and vitals reviewed.  Results for orders placed or performed in visit on 10/08/17  Lipid panel  Result Value Ref Range   Cholesterol 218 (H) 0 - 200 mg/dL   Triglycerides 119.0 0.0 - 149.0 mg/dL    HDL 46.20 >39.00 mg/dL   VLDL 23.8 0.0 - 40.0 mg/dL   LDL Cholesterol 148 (H) 0 - 99 mg/dL   Total CHOL/HDL Ratio 5    NonHDL 171.84   Comprehensive metabolic panel  Result Value Ref Range   Sodium 137 135 - 145 mEq/L   Potassium 3.9 3.5 - 5.1 mEq/L   Chloride 103 96 - 112 mEq/L   CO2 29 19 - 32 mEq/L   Glucose, Bld 113 (H) 70 - 99 mg/dL   BUN 12 6 - 23 mg/dL   Creatinine, Ser 1.12 0.40 - 1.20 mg/dL   Total Bilirubin 0.4 0.2 - 1.2 mg/dL   Alkaline Phosphatase 60 39 - 117 U/L   AST 14 0 - 37 U/L   ALT 20 0 - 35 U/L   Total Protein 8.1 6.0 - 8.3 g/dL   Albumin 3.9 3.5 - 5.2 g/dL   Calcium 9.4 8.4 -  10.5 mg/dL   GFR 63.62 >60.00 mL/min  TSH  Result Value Ref Range   TSH 2.62 0.35 - 4.50 uIU/mL  VITAMIN D 25 Hydroxy (Vit-D Deficiency, Fractures)  Result Value Ref Range   VITD 31.91 30.00 - 100.00 ng/mL      Assessment & Plan:   Problem List Items Addressed This Visit    Essential hypertension    Chronic, stable. Continue current regimen.       Relevant Medications   amLODipine (NORVASC) 5 MG tablet   Health maintenance examination - Primary    Preventative protocols reviewed and updated unless pt declined. Discussed healthy diet and lifestyle.       HLD (hyperlipidemia)    Chronic off medication. The 10-year ASCVD risk score [name]., et al., 2013) is: 7.1%   Values used to calculate the score:     Age: 80 years     Sex: Female     Is Non-Hispanic African American: Yes     Diabetic: No     Tobacco smoker: No     Systolic Blood Pressure: 175 mmHg     Is BP treated: Yes     HDL Cholesterol: 46.2 mg/dL     Total Cholesterol: 218 mg/dL       Relevant Medications   amLODipine (NORVASC) 5 MG tablet   Obesity (BMI 30.0-34.9)    Encouraged healthy diet and lifestyle changes to affect sustainable weight loss.       Onychomycosis of toenail    LFTs normal - will Rx 6 wk terbinafine course.      Relevant Medications   terbinafine (LAMISIL) 250 MG tablet     Post-menopausal bleeding    Endorses rare periods 2016, 2017 early 2018 was last period. Has been >1 yr without flow, but restarted this week. Will refer to GYN for post-menopausal bleed - pt agrees.       Relevant Orders   Ambulatory referral to Gynecology   Vitamin D deficiency    Has not been regular with 1000 IU vit D. Has restarted.           Meds ordered this encounter  Medications  . amLODipine (NORVASC) 5 MG tablet    Sig: Take 1 tablet (5 mg total) by mouth daily.    Dispense:  90 tablet    Refill:  3  . methocarbamol (ROBAXIN) 500 MG tablet    Sig: Take 1-2 tablets (500-1,000 mg total) by mouth at bedtime as needed for muscle spasms (sedation precautions).    Dispense:  30 tablet    Refill:  1  . terbinafine (LAMISIL) 250 MG tablet    Sig: Take 1 tablet (250 mg total) by mouth daily.    Dispense:  45 tablet    Refill:  0   Orders Placed This Encounter  Procedures  . Ambulatory referral to Gynecology    Referral Priority:   Routine    Referral Type:   Consultation    Referral Reason:   Specialty Services Required    Requested Specialty:   Gynecology    Number of Visits Requested:   1    Follow up plan: Return in about 1 year (around 10/17/2018) for annual exam, prior fasting for blood work.  Ria Bush, MD

## 2017-10-16 NOTE — Assessment & Plan Note (Addendum)
Endorses rare periods 2016, 2017 early 2018 was last period. Has been >1 yr without flow, but restarted this week. Will refer to GYN for post-menopausal bleed - pt agrees.

## 2017-10-26 ENCOUNTER — Encounter: Payer: Self-pay | Admitting: Obstetrics and Gynecology

## 2017-11-04 ENCOUNTER — Encounter: Payer: Self-pay | Admitting: Obstetrics and Gynecology

## 2017-11-04 ENCOUNTER — Ambulatory Visit (INDEPENDENT_AMBULATORY_CARE_PROVIDER_SITE_OTHER): Payer: BC Managed Care – PPO | Admitting: Obstetrics and Gynecology

## 2017-11-04 VITALS — BP 128/80 | HR 81 | Ht 65.0 in | Wt 211.0 lb

## 2017-11-04 DIAGNOSIS — N950 Postmenopausal bleeding: Secondary | ICD-10-CM | POA: Diagnosis not present

## 2017-11-04 NOTE — Progress Notes (Signed)
Gynecology H&P  Chief Complaint:  Chief Complaint  Patient presents with  . Post menopausal bleeding    Referred By [facility], Dr. Danise Mina    History of Present Illness: Patient is a 62 y.o. No obstetric history on file. presents evaluation of postmenopausal bleeding. The patient states she has had a single episode(s) of bleeding in the past 1 month(s).  The most recent episode occurred  1  month(s) ago.  The bleeding has been heavy and has contained clots . She describes the blood as Bright red in appearance.  She has had cramping. She deniestrauma or other inciting event, bloating, early satiety and abdominal pain. She does not have a history of abnormal pap smears.  Her last pap smear was2 years ago and was read as no abnormalities .  The patient is not currently sexually active There are no other aggravating factors reported. There are no alleviating factors reported. The patient's past medical history is noncontributory.  No HRT exposure.  She has nothad prior work up for postmenopausal bleeding.  Review of Systems: 10 point review of systems negative unless otherwise noted in HPI  Past Medical History:  Past Medical History:  Diagnosis Date  . Cervical radiculopathy    Dumonsky  . Chronic kidney disease    RENAL INSUFF  . CTS (carpal tunnel syndrome)    bilateral  . Glaucoma    early stages (Dr. Wallace Going and Dr. Gloriann Loan)  . History of measles   . History of mumps   . Hypertension   . Obesity (BMI 30.0-34.9)   . Umbilical hernia 7673   s/p repair    Past Surgical History:  Past Surgical History:  Procedure Laterality Date  . ANTERIOR CERVICAL DECOMP/DISCECTOMY FUSION N/A 06/08/2013   ANTERIOR CERVICAL DECOMPRESSION/DISCECTOMY FUSION 3 LEVELS;  Surgeon: Sinclair Ship, MD;  Anterior cervical decompression fusion with instrumentation and allograft (C4-7)  . CATARACT EXTRACTION, BILATERAL  05/2017  . CHOLECYSTECTOMY N/A 02/28/2016   Procedure: LAPAROSCOPIC  CHOLECYSTECTOMY WITH INTRAOPERATIVE CHOLANGIOGRAM;  Surgeon: Robert Bellow, MD;  Location: [facility] ORS;  Service: General;  Laterality: N/A;  . COLONOSCOPY W/ BIOPSIES AND POLYPECTOMY     no records with Dr Vira Agar  . DEXA  11/2012   WNL  . EYE SURGERY Left    laser  . UMBILICAL HERNIA REPAIR N/A 02/28/2016   Procedure: PRIMARY REPAIR VENTRAL/ UMBILICAL ADULT;  Surgeon: Robert Bellow, MD;  Location: [facility] ORS;  Service: General;  Laterality: N/A;    Family History:  Family History  Problem Relation Age of Onset  . Glaucoma Mother   . CAD Mother   . Diabetes Mother   . Stroke Mother   . Cancer Father        unsure type  . Hypertension Brother        and sister    Social History:  Social History   Socioeconomic History  . Marital status: Married    Spouse name: Not on file  . Number of children: Not on file  . Years of education: Not on file  . Highest education level: Not on file  Occupational History  . Not on file  Social Needs  . Financial resource strain: Not on file  . Food insecurity:    Worry: Not on file    Inability: Not on file  . Transportation needs:    Medical: Not on file    Non-medical: Not on file  Tobacco Use  . Smoking status: Never Smoker  . Smokeless  tobacco: Never Used  Substance and Sexual Activity  . Alcohol use: No    Alcohol/week: 0.0 oz  . Drug use: No  . Sexual activity: Not Currently  Lifestyle  . Physical activity:    Days per week: Not on file    Minutes per session: Not on file  . Stress: Not on file  Relationships  . Social connections:    Talks on phone: Not on file    Gets together: Not on file    Attends religious service: Not on file    Active member of club or organization: Not on file    Attends meetings of clubs or organizations: Not on file    Relationship status: Not on file  . Intimate partner violence:    Fear of current or ex partner: Not on file    Emotionally abused: Not on file    Physically abused:  Not on file    Forced sexual activity: Not on file  Other Topics Concern  . Not on file  Social History Narrative   Lives with [name]   G0   Married   Occupation: dispatcher   Edu: college   Activity: no regular exercise, does have treadmill and weight lifting bench   Diet: increasing water, fruits/vegetables daily    Allergies:  Allergies  Allergen Reactions  . Aleve [Naproxen Sodium] Hives, Itching and Swelling  . Citrus Swelling    Medications: Prior to Admission medications   Medication Sig Start Date End Date Taking? Authorizing Provider  acetaminophen (TYLENOL) 500 MG tablet Take 1,000 mg by mouth daily.    [provider]  amLODipine (NORVASC) 5 MG tablet Take 1 tablet (5 mg total) by mouth daily. 10/16/17   Ria Bush, MD  brimonidine [facility]) 0.2 % ophthalmic solution  09/27/17   [provider]  Cholecalciferol (VITAMIN D-3) 1000 UNITS CAPS Take 1,000 Units by mouth daily.    [provider]  Coenzyme Q10 (COQ-10) 100 MG CAPS Take 100 mg by mouth daily.    [provider]  cyanocobalamin 500 MCG tablet Take 500 mcg by mouth daily.     [provider]  diclofenac (VOLTAREN) 75 MG EC tablet Take 1 tablet (75 mg total) by mouth 2 (two) times daily. For 5 days then as needed 08/21/17   Ria Bush, MD  dorzolamide (TRUSOPT) 2 % ophthalmic solution PLACE 1 DROP INTO BOTH EYES TWICE A DAY 08/13/15   [provider]  latanoprost (XALATAN) 0.005 % ophthalmic solution  09/27/15   [provider]  methocarbamol (ROBAXIN) 500 MG tablet Take 1-2 tablets (500-1,000 mg total) by mouth at bedtime as needed for muscle spasms (sedation precautions). 10/16/17   Ria Bush, MD  ondansetron (ZOFRAN) 4 MG tablet Take 1 tablet (4 mg total) by mouth every 8 (eight) hours as needed for nausea or vomiting. 01/24/16   Ria Bush, MD  terbinafine (LAMISIL) 250 MG tablet Take 1 tablet (250 mg total) by mouth  daily. 10/16/17   Ria Bush, MD  timolol (TIMOPTIC) 0.5 % ophthalmic solution  09/27/15   [provider]    Physical Exam Vitals: Last menstrual period 11/20/2015.  General: NAD HEENT: normocephalic, anicteric Pulmonary: No increased work of breathing  masses palpable. No evidence of hernia  Genitourinary:  External: Normal external female genitalia.  Normal urethral meatus, normal  Bartholin's and Skene's glands.    Vagina: Normal vaginal mucosa, no evidence of prolapse.    Cervix: Grossly normal in appearance, no bleeding  Uterus: Non-enlarged, mobile, normal contour.  No CMT  Adnexa: ovaries non-enlarged, no adnexal masses  Rectal: deferred Extremities: no edema, erythema, or tenderness Neurologic: Grossly intact Psychiatric: mood appropriate, affect full  Assessment: 63 y.o. No obstetric history on file. presenting for evaluation of postmenopausal bleeding  Plan: Problem List Items Addressed This Visit    None    Visit Diagnoses    Postmenopausal bleeding    -  Primary   Relevant Orders   US Transvaginal Non-OB      1) We discussed that menopause is a clinical diagnosis made after 12 months of amenorrhea.  The average age of menopause in the  [place] population is 52 but there may be significant variation.  Any bleeding that happens after a 12 month period of amenorrhea warrants further work.  Possible etiologies of postmenopausal bleeding were discussed with the patient today.  These may range from benign etiologies such as urethral prolapse and atrophy, to indeterminate lesions such as submucosal fibroids or polyps which would require resection to accurately evaluate. The role of unopposed estrogen in the development of  dndometrial hyperplasia or carcinoma is discussed.  The risk of endometrial hyperplasia is linearly correlated with increasing BMI given the production of estrone by adipose tissue.  Work up will be include transvaginal ultrasound to assess  the thickness of the endometrial lining as well as to assess for focal uterine lesions.  Negative ultrasound evaluation, defined as the absence of focal lesions and endometrial stripe of <63mm, effectively rules out carcinoma and confirms atrophy as the most likely etiology.  Should focal lesions be present these generally require hysteroscopic resection.  Should lining be greater >57mm endometrial biopsy is warranted to rule out hyperplasia or frank endometrial cancer.  Continued episodes of bleeding despite negative ultrasound also warrant endometrial sampling.  As the cervical pathology may also be implicated in postmenopausal bleeding prior cervical cytology was reviewed and repeated if required per ASCCP guidelines.   2) Evaluation by pelvc ultrasound scheduled, with follow up after [place]. EMB discussed and may be performed as well. Pros and cons of these modalities of testing discussed.   3) Return in about 1 week (around 11/11/2017) for 1-2 week TVUS and follow up .   Malachy Mood, MD, [facility], [facility] 11/04/2017, 9:40 AM

## 2017-11-06 ENCOUNTER — Other Ambulatory Visit: Payer: Self-pay | Admitting: Family Medicine

## 2017-11-06 DIAGNOSIS — Z1231 Encounter for screening mammogram for malignant neoplasm of breast: Secondary | ICD-10-CM

## 2017-11-20 ENCOUNTER — Encounter: Payer: Self-pay | Admitting: Obstetrics and Gynecology

## 2017-11-20 ENCOUNTER — Ambulatory Visit (INDEPENDENT_AMBULATORY_CARE_PROVIDER_SITE_OTHER): Payer: BC Managed Care – PPO | Admitting: Obstetrics and Gynecology

## 2017-11-20 ENCOUNTER — Ambulatory Visit (INDEPENDENT_AMBULATORY_CARE_PROVIDER_SITE_OTHER): Payer: BC Managed Care – PPO

## 2017-11-20 VITALS — BP 122/72 | HR 81 | Wt 210.0 lb

## 2017-11-20 DIAGNOSIS — D251 Intramural leiomyoma of uterus: Secondary | ICD-10-CM | POA: Diagnosis not present

## 2017-11-20 DIAGNOSIS — N950 Postmenopausal bleeding: Secondary | ICD-10-CM | POA: Diagnosis not present

## 2017-11-21 NOTE — Progress Notes (Signed)
Gynecology Ultrasound Follow Up  Chief Complaint:  Chief Complaint  Patient presents with  . Follow-up    GYN U/S     History of Present Illness: Patient is a 62 y.o. female who presents today for ultrasound evaluation of postmenopausal bleeding .  Ultrasound demonstrates the following findgins Adnexa: normal appearance Uterus: Two small intramural fibroids with endometrial stripe  58mm Additional: no free fluid  No further bleeding noted by patient since last episode reported.  Review of Systems: Review of Systems  Constitutional: Negative.   Gastrointestinal: Negative.     Past Medical History:  Past Medical History:  Diagnosis Date  . Cervical radiculopathy    Dumonsky  . Chronic kidney disease    RENAL INSUFF  . CTS (carpal tunnel syndrome)    bilateral  . Glaucoma    early stages (Dr. Wallace Going and Dr. Gloriann Loan)  . History of measles   . History of mumps   . Hypertension   . Obesity (BMI 30.0-34.9)   . Umbilical hernia 9379   s/p repair    Past Surgical History:  Past Surgical History:  Procedure Laterality Date  . ANTERIOR CERVICAL DECOMP/DISCECTOMY FUSION N/A 06/08/2013   ANTERIOR CERVICAL DECOMPRESSION/DISCECTOMY FUSION 3 LEVELS;  Surgeon: Sinclair Ship, MD;  Anterior cervical decompression fusion with instrumentation and allograft (C4-7)  . CATARACT EXTRACTION, BILATERAL  05/2017  . CHOLECYSTECTOMY N/A 02/28/2016   Procedure: LAPAROSCOPIC CHOLECYSTECTOMY WITH INTRAOPERATIVE CHOLANGIOGRAM;  Surgeon: Robert Bellow, MD;  Location: [facility] ORS;  Service: General;  Laterality: N/A;  . COLONOSCOPY W/ BIOPSIES AND POLYPECTOMY     no records with Dr Vira Agar  . DEXA  11/2012   WNL  . EYE SURGERY Left    laser  . UMBILICAL HERNIA REPAIR N/A 02/28/2016   Procedure: PRIMARY REPAIR VENTRAL/ UMBILICAL ADULT;  Surgeon: Robert Bellow, MD;  Location: [facility] ORS;  Service: General;  Laterality: N/A;    Gynecologic History:  Patient's last menstrual  period was 11/20/2015. Last Pap: 10/09/2015 Results were: .no abnormalities  Family History:  Family History  Problem Relation Age of Onset  . Glaucoma Mother   . CAD Mother   . Diabetes Mother   . Stroke Mother   . Cancer Father        unsure type  . Hypertension Brother        and sister    Social History:  Social History   Socioeconomic History  . Marital status: Married    Spouse name: Not on file  . Number of children: Not on file  . Years of education: Not on file  . Highest education level: Not on file  Occupational History  . Not on file  Social Needs  . Financial resource strain: Not on file  . Food insecurity:    Worry: Not on file    Inability: Not on file  . Transportation needs:    Medical: Not on file    Non-medical: Not on file  Tobacco Use  . Smoking status: Never Smoker  . Smokeless tobacco: Never Used  Substance and Sexual Activity  . Alcohol use: No    Alcohol/week: 0.0 oz  . Drug use: No  . Sexual activity: Not Currently  Lifestyle  . Physical activity:    Days per week: Not on file    Minutes per session: Not on file  . Stress: Not on file  Relationships  . Social connections:    Talks on phone: Not on file  Gets together: Not on file    Attends religious service: Not on file    Active member of club or organization: Not on file    Attends meetings of clubs or organizations: Not on file    Relationship status: Not on file  . Intimate partner violence:    Fear of current or ex partner: Not on file    Emotionally abused: Not on file    Physically abused: Not on file    Forced sexual activity: Not on file  Other Topics Concern  . Not on file  Social History Narrative   Lives with [name]   G0   Married   Occupation: dispatcher   Edu: college   Activity: no regular exercise, does have treadmill and weight lifting bench   Diet: increasing water, fruits/vegetables daily    Allergies:  Allergies  Allergen Reactions  .  Aleve [Naproxen Sodium] Hives, Itching and Swelling  . Citrus Swelling    Medications: Prior to Admission medications   Medication Sig Start Date End Date Taking? Authorizing Provider  acetaminophen (TYLENOL) 500 MG tablet Take 1,000 mg by mouth daily.   Yes [provider]  amLODipine (NORVASC) 5 MG tablet Take 1 tablet (5 mg total) by mouth daily. 10/16/17  Yes Ria Bush, MD  brimonidine [facility]) 0.2 % ophthalmic solution  09/27/17  Yes [provider]  Cholecalciferol (VITAMIN D-3) 1000 UNITS CAPS Take 1,000 Units by mouth daily.   Yes [provider]  Coenzyme Q10 (COQ-10) 100 MG CAPS Take 100 mg by mouth daily.   Yes [provider]  cyanocobalamin 500 MCG tablet Take 500 mcg by mouth daily.    Yes [provider]  diclofenac (VOLTAREN) 75 MG EC tablet Take 1 tablet (75 mg total) by mouth 2 (two) times daily. For 5 days then as needed 08/21/17  Yes Ria Bush, MD  dorzolamide (TRUSOPT) 2 % ophthalmic solution PLACE 1 DROP INTO BOTH EYES TWICE A DAY 08/13/15  Yes [provider]  latanoprost (XALATAN) 0.005 % ophthalmic solution  09/27/15  Yes [provider]  methocarbamol (ROBAXIN) 500 MG tablet Take 1-2 tablets (500-1,000 mg total) by mouth at bedtime as needed for muscle spasms (sedation precautions). 10/16/17  Yes Ria Bush, MD  ondansetron (ZOFRAN) 4 MG tablet Take 1 tablet (4 mg total) by mouth every 8 (eight) hours as needed for nausea or vomiting. 01/24/16  Yes Ria Bush, MD  terbinafine (LAMISIL) 250 MG tablet Take 1 tablet (250 mg total) by mouth daily. 10/16/17  Yes Ria Bush, MD  timolol (TIMOPTIC) 0.5 % ophthalmic solution  09/27/15  Yes [provider]    Physical Exam Vitals: Blood pressure 122/72, pulse 81, weight 210 lb (95.3 kg), last menstrual period 11/20/2015.  General: NAD HEENT: normocephalic, anicteric Pulmonary: No increased work of breathing Extremities: no  edema, erythema, or tenderness Neurologic: Grossly intact, normal gait Psychiatric: mood appropriate, affect full   Assessment: 62 y.o. No obstetric history on file. No problem-specific Assessment & Plan notes found for this encounter.   Plan: Problem List Items Addressed This Visit    None    Visit Diagnoses    PMB (postmenopausal bleeding)    -  Primary      1) PMB - discussed findings on ultrasound today reassuring.  No further work up warranted at this time.  We discussed that further episodes of bleeding would warrant proceeding with endometrial biopsy and requiring representation.  I will otherwise see the patient in  1 year for routine annual and pap smear.  2) A total of 15 minutes were spent in face-to-face contact with the patient during this encounter with over half of that time devoted to counseling and coordination of care.  3) Return in about 1 year (around 11/21/2018) for annual.   Malachy Mood, MD, [place], [place]

## 2017-11-26 ENCOUNTER — Ambulatory Visit
Admission: RE | Admit: 2017-11-26 | Discharge: 2017-11-26 | Disposition: A | Discharge status: Home / Self Care | Payer: BC Managed Care – PPO | Source: Ambulatory Visit | Attending: Family Medicine | Admitting: Family Medicine

## 2017-11-26 DIAGNOSIS — Z1231 Encounter for screening mammogram for malignant neoplasm of breast: Secondary | ICD-10-CM | POA: Insufficient documentation

## 2017-11-26 LAB — HM MAMMOGRAPHY

## 2017-11-27 ENCOUNTER — Encounter: Payer: Self-pay | Admitting: Family Medicine

## 2017-12-02 ENCOUNTER — Other Ambulatory Visit: Payer: Self-pay | Admitting: Family Medicine

## 2017-12-02 DIAGNOSIS — Z5181 Encounter for therapeutic drug level monitoring: Secondary | ICD-10-CM

## 2017-12-02 DIAGNOSIS — B351 Tinea unguium: Secondary | ICD-10-CM

## 2017-12-03 NOTE — Telephone Encounter (Signed)
Lamisil Last filled:  10/16/17, #45 Last OV (CPE):  10/16/17 Next OV:  none

## 2017-12-09 NOTE — Telephone Encounter (Signed)
Declined. If needs second course of lamisil ,recommend she come in for LFTs (ordered). plz notify patient.

## 2017-12-10 NOTE — Telephone Encounter (Signed)
Spoke with pt and scheduled lab visit for 12/15/17 at 8:45 AM.  FYI to Dr. Darnell Level.

## 2017-12-15 ENCOUNTER — Other Ambulatory Visit (INDEPENDENT_AMBULATORY_CARE_PROVIDER_SITE_OTHER): Payer: BC Managed Care – PPO

## 2017-12-15 ENCOUNTER — Telehealth: Payer: Self-pay

## 2017-12-15 DIAGNOSIS — Z5181 Encounter for therapeutic drug level monitoring: Secondary | ICD-10-CM

## 2017-12-15 DIAGNOSIS — B351 Tinea unguium: Secondary | ICD-10-CM | POA: Diagnosis not present

## 2017-12-15 LAB — HEPATIC FUNCTION PANEL
ALBUMIN: 4.1 g/dL (ref 3.5–5.2)
ALK PHOS: 65 U/L (ref 39–117)
ALT: 15 U/L (ref 0–35)
AST: 12 U/L (ref 0–37)
Bilirubin, Direct: 0.1 mg/dL (ref 0.0–0.3)
Total Bilirubin: 0.4 mg/dL (ref 0.2–1.2)
Total Protein: 7.9 g/dL (ref 6.0–8.3)

## 2017-12-15 NOTE — Telephone Encounter (Signed)
Copied from [place]. Topic: General - Other >> Dec 15, 2017  1:56 PM Yvette Rack wrote: Reason for CRM: Pt returned call to office. Pt requests call back. Cb# [phone]

## 2017-12-15 NOTE — Telephone Encounter (Signed)
Spoke with pt informing her I do not see any encounters or results that someone may have been calling about.  Expresses her thanks for the call back anyway.

## 2017-12-18 MED ORDER — TERBINAFINE HCL 250 MG PO TABS: 250.0000 mg | ORAL_TABLET | Freq: Every day | ORAL | 0 refills | Status: DC

## 2017-12-18 NOTE — Telephone Encounter (Signed)
LFTs normal. lamisil refilled

## 2017-12-18 NOTE — Addendum Note (Signed)
Addended by: Ria Bush on: 12/18/2017 09:20 AM   Modules accepted: Orders

## 2017-12-21 ENCOUNTER — Other Ambulatory Visit: Payer: Self-pay | Admitting: Family Medicine

## 2017-12-21 NOTE — Telephone Encounter (Signed)
Electronic refill request LOV 10/16/17 Last refill 10/16/17 #30/1

## 2018-02-25 ENCOUNTER — Ambulatory Visit: Payer: BC Managed Care – PPO | Admitting: Family Medicine

## 2018-02-25 ENCOUNTER — Encounter: Payer: Self-pay | Admitting: Family Medicine

## 2018-02-25 VITALS — BP 118/70 | HR 72 | Temp 98.6°F | Ht 65.0 in | Wt 207.5 lb

## 2018-02-25 DIAGNOSIS — R1013 Epigastric pain: Secondary | ICD-10-CM | POA: Diagnosis not present

## 2018-02-25 MED ORDER — METHOCARBAMOL 500 MG PO TABS: 500.0000 mg | ORAL_TABLET | Freq: Two times a day (BID) | ORAL | 3 refills | Status: DC | PRN

## 2018-02-25 MED ORDER — OMEPRAZOLE 40 MG PO CPDR: 40.0000 mg | DELAYED_RELEASE_CAPSULE | Freq: Every day | ORAL | 3 refills | Status: DC

## 2018-02-25 NOTE — Assessment & Plan Note (Signed)
No red flags. Anticipate GERD related discomfort. Reviewed dietary and lifestyle recommendations to help control symptoms. Start omeprazole 40mg  daily x3 wks then PRN. If no improvement, will have her return for labwork, consider abd imaging. Pt agrees with plan.

## 2018-02-25 NOTE — Progress Notes (Signed)
BP 118/70 (BP Location: Left Arm, Patient Position: Sitting, Cuff Size: Large)   Pulse 72   Temp 98.6 F (37 C) (Oral)   Ht 5\' 5"  (1.651 m)   Wt 207 lb 8 oz (94.1 kg)   LMP 11/20/2015   SpO2 96%   BMI 34.53 kg/m    CC: nausea after eating Subjective:    Patient ID: Martha Wade, female    DOB: Feb 06, 1956, 62 y.o.   MRN: [id number]  HPI: Martha Wade is a 62 y.o. female presenting on 02/25/2018 for Nausea (C/o nausea after eating. Started about 2 wks ago. Denies any vomiting or diarrhea. Sometimes has feeling of something lodged in epigastric area. Ate at [place] recently and had some burning in epigastric area. Took some of her husband's omeprazole which eased discomfort but was still there. Pt wanted to mention she had her gallbladder removed and was not sure if that had anything to do with her sxs. )   2-3 wk h/o uncomfortable fullness in epigastric region with meals, occasional nausea. Worse with spicy foods.   Tried one of husband's omeprazole with some benefit.   No dysphagia or unexplained weight loss.  No fevers/chills, vomiting or diarrhea, abdominal pain, constipation, chest pain, dyspnea or cough.   S/p cholecystectomy 2017.  Pt reports colonoscopy ~2017. No records available (none available, pt states she saw Olga).  She is not current taking diclofenac.   She does chew mint flavored gum regularly. She does regularly drink caffeinated beverages.   Relevant past medical, surgical, family and social history reviewed and updated as indicated. Interim medical history since our last visit reviewed. Allergies and medications reviewed and updated. Outpatient Medications Prior to Visit  Medication Sig Dispense Refill  . acetaminophen (TYLENOL) 500 MG tablet Take 1,000 mg by mouth daily.    Marland Kitchen amLODipine (NORVASC) 5 MG tablet Take 1 tablet (5 mg total) by mouth daily. 90 tablet 3  . brimonidine (ALPHAGAN) 0.2 % ophthalmic solution     . Cholecalciferol (VITAMIN  D-3) 1000 UNITS CAPS Take 1,000 Units by mouth daily.    . Coenzyme Q10 (COQ-10) 100 MG CAPS Take 100 mg by mouth daily.    . cyanocobalamin 500 MCG tablet Take 500 mcg by mouth daily.     . diclofenac (VOLTAREN) 75 MG EC tablet Take 1 tablet (75 mg total) by mouth 2 (two) times daily. For 5 days then as needed 30 tablet 0  . dorzolamide (TRUSOPT) 2 % ophthalmic solution PLACE 1 DROP INTO BOTH EYES TWICE A DAY  5  . latanoprost (XALATAN) 0.005 % ophthalmic solution   4  . Omega-3 Fatty Acids (FISH OIL) 1000 MG CAPS Take 1 capsule by mouth daily.    . ondansetron (ZOFRAN) 4 MG tablet Take 1 tablet (4 mg total) by mouth every 8 (eight) hours as needed for nausea or vomiting. 20 tablet 0  . terbinafine (LAMISIL) 250 MG tablet Take 1 tablet (250 mg total) by mouth daily. 45 tablet 0  . timolol (TIMOPTIC) 0.5 % ophthalmic solution   4  . methocarbamol (ROBAXIN) 500 MG tablet TAKE 1 TO 2 TABLETS BY MOUTH AT BEDTIME AS NEEDED FOR MUSCLE SPASMS, SEDATION PRECAUTIONS 30 tablet 0   No facility-administered medications prior to visit.      Per HPI unless specifically indicated in ROS section below Review of Systems     Objective:    BP 118/70 (BP Location: Left Arm, Patient Position: Sitting, Cuff Size: Large)   Pulse 72  Temp 98.6 F (37 C) (Oral)   Ht 5\' 5"  (1.651 m)   Wt 207 lb 8 oz (94.1 kg)   LMP 11/20/2015   SpO2 96%   BMI 34.53 kg/m   Wt Readings from Last 3 Encounters:  02/25/18 207 lb 8 oz (94.1 kg)  11/20/17 210 lb (95.3 kg)  11/04/17 211 lb (95.7 kg)    Physical Exam  Constitutional: She appears well-developed and well-nourished. No distress.  HENT:  Mouth/Throat: Oropharynx is clear and moist. No oropharyngeal exudate.  Cardiovascular: Normal rate, regular rhythm and normal heart sounds.  No murmur heard. Pulmonary/Chest: Effort normal and breath sounds normal. No respiratory distress. She has no wheezes. She has no rales.  Abdominal: Soft. Bowel sounds are normal. She  exhibits no distension and no mass. There is no hepatosplenomegaly. There is no tenderness. There is no rebound, no guarding, no CVA tenderness and negative Murphy's sign. No hernia.  Psychiatric: She has a normal mood and affect.  Nursing note and vitals reviewed.  Results for orders placed or performed in visit on 12/15/17  Hepatic function panel  Result Value Ref Range   Total Bilirubin 0.4 0.2 - 1.2 mg/dL   Bilirubin, Direct 0.1 0.0 - 0.3 mg/dL   Alkaline Phosphatase 65 39 - 117 U/L   AST 12 0 - 37 U/L   ALT 15 0 - 35 U/L   Total Protein 7.9 6.0 - 8.3 g/dL   Albumin 4.1 3.5 - 5.2 g/dL      Assessment & Plan:   Problem List Items Addressed This Visit    Abdominal discomfort, epigastric - Primary    No red flags. Anticipate GERD related discomfort. Reviewed dietary and lifestyle recommendations to help control symptoms. Start omeprazole 40mg  daily x3 wks then PRN. If no improvement, will have her return for labwork, consider abd imaging. Pt agrees with plan.           Meds ordered this encounter  Medications  . methocarbamol (ROBAXIN) 500 MG tablet    Sig: Take 1 tablet (500 mg total) by mouth 2 (two) times daily as needed for muscle spasms.    Dispense:  30 tablet    Refill:  3  . omeprazole (PRILOSEC) 40 MG capsule    Sig: Take 1 capsule (40 mg total) by mouth daily.    Dispense:  30 capsule    Refill:  3   No orders of the defined types were placed in this encounter.   Follow up plan: Return in about 3 months (around 05/28/2018), or if symptoms worsen or fail to improve.  Ria Bush, MD

## 2018-02-25 NOTE — Patient Instructions (Addendum)
Bring [place] copy of your colonoscopy report.  I think this may be heartburn related - treat with omeprazole 40mg  daily for 3 weeks then as needed. Avoidance of citrus, fatty foods, chocolate, peppermint, and excessive alcohol, along with sodas, orange juice (acidic drinks) At least a few hours between dinner and bed, minimize naps after eating. If above doesn't resolve symptoms, let me know to return for blood work.

## 2018-02-27 ENCOUNTER — Other Ambulatory Visit: Payer: Self-pay | Admitting: Family Medicine

## 2018-03-02 NOTE — Telephone Encounter (Signed)
Electronic refill request Last office visit 02/25/18 Last refill 01/24/16 #20

## 2018-03-03 ENCOUNTER — Other Ambulatory Visit: Payer: Self-pay | Admitting: Family Medicine

## 2018-03-04 ENCOUNTER — Encounter: Payer: Self-pay | Admitting: Family Medicine

## 2018-03-04 MED ORDER — ONDANSETRON HCL 4 MG PO TABS: 4.0000 mg | ORAL_TABLET | Freq: Three times a day (TID) | ORAL | 0 refills | Status: DC | PRN

## 2018-03-04 NOTE — Telephone Encounter (Signed)
plz notify zofran refilled. plz let us know if ongoing trouble despite treatment.

## 2018-03-04 NOTE — Telephone Encounter (Signed)
Left message on vm per dpr relaying Dr. G's message.  

## 2018-04-03 ENCOUNTER — Encounter: Payer: Self-pay | Admitting: Family Medicine

## 2018-05-21 DIAGNOSIS — K2100 Gastro-esophageal reflux disease with esophagitis, without bleeding: Secondary | ICD-10-CM | POA: Insufficient documentation

## 2018-06-24 ENCOUNTER — Other Ambulatory Visit: Payer: Self-pay | Admitting: Family Medicine

## 2018-06-25 ENCOUNTER — Ambulatory Visit (INDEPENDENT_AMBULATORY_CARE_PROVIDER_SITE_OTHER)
Admission: RE | Admit: 2018-06-25 | Discharge: 2018-06-25 | Disposition: A | Discharge status: Home / Self Care | Payer: BC Managed Care – PPO | Source: Ambulatory Visit | Attending: Family Medicine | Admitting: Family Medicine

## 2018-06-25 ENCOUNTER — Encounter: Payer: Self-pay | Admitting: Family Medicine

## 2018-06-25 ENCOUNTER — Ambulatory Visit: Payer: BC Managed Care – PPO | Admitting: Family Medicine

## 2018-06-25 VITALS — BP 132/86 | HR 66 | Temp 98.7°F | Ht 65.0 in | Wt 210.0 lb

## 2018-06-25 DIAGNOSIS — R042 Hemoptysis: Secondary | ICD-10-CM

## 2018-06-25 DIAGNOSIS — R058 Other specified cough: Secondary | ICD-10-CM | POA: Insufficient documentation

## 2018-06-25 DIAGNOSIS — R05 Cough: Secondary | ICD-10-CM | POA: Insufficient documentation

## 2018-06-25 NOTE — Patient Instructions (Signed)
I think you have a post viral cough syndrome  Take mucinex DM to stop cough cycle   If you develop worse cough or fever or other symptoms please call  We will do a chest xray and get back to you with a result   Take care of yourself  If the blood comes back -let us know

## 2018-06-25 NOTE — Assessment & Plan Note (Signed)
S/p illness 3 weeks ago  Had 2 episodes of hemoptysis as well  Phlegm is now clear/scant and not thick (it was prev) Reassuring exam  cxr today in light of above  Enc her to start taking DM cough med otc on a schedule until cough improves  If worse or fever- alert [place]

## 2018-06-25 NOTE — Progress Notes (Signed)
Subjective:    Patient ID: Martha Wade, female    DOB: August 17, 1955, 63 y.o.   MRN: [id number]  HPI Here for cough and congestion  Had some blood tinged phlegm   Pulse ox 97%  Wt Readings from Last 3 Encounters:  06/25/18 210 lb (95.3 kg)  02/25/18 207 lb 8 oz (94.1 kg)  11/20/17 210 lb (95.3 kg)   As never smoked   Had a really bad cold in January -lasted about 3 wk with cough and congestion tx with otc meds  Once before that -had a little blood when she coughed  Then last weekend  coughed up thick phlegm with a little blood  The next day she coughed and it hurt   Cough now is mild  Clear sputum  Feels ok -no fever or sob  No hx of lung disease  No smoke or chemical exposure   No exp to TB   Patient Active Problem List   Diagnosis Date Noted  . Hemoptysis 06/25/2018  . Post-viral cough syndrome 06/25/2018  . Abdominal discomfort, epigastric 02/25/2018  . Post-menopausal bleeding 10/16/2017  . Onychomycosis of toenail 10/16/2017  . Lower back pain 08/21/2017  . Renal insufficiency 10/09/2015  . Pain of left thumb 09/28/2015  . Health maintenance examination 09/18/2014  . Essential hypertension 09/18/2014  . HLD (hyperlipidemia) 09/18/2014  . Left shoulder pain 09/18/2014  . Obesity (BMI 30.0-34.9)   . Vitamin D deficiency 09/13/2014  . Myeloradiculopathy 06/08/2013   Past Medical History:  Diagnosis Date  . Cervical radiculopathy    [place]  . Chronic kidney disease    RENAL INSUFF  . CTS (carpal tunnel syndrome)    bilateral  . Glaucoma    early stages (Dr. Wallace Going and Dr. Gloriann Loan)  . History of measles   . History of mumps   . Hypertension   . Obesity (BMI 30.0-34.9)   . Umbilical hernia 6644   s/p repair   Past Surgical History:  Procedure Laterality Date  . ANTERIOR CERVICAL DECOMP/DISCECTOMY FUSION N/A 06/08/2013   ANTERIOR CERVICAL DECOMPRESSION/DISCECTOMY FUSION 3 LEVELS;  Surgeon: Sinclair Ship, MD;  Anterior cervical  decompression fusion with instrumentation and allograft (C4-7)  . CATARACT EXTRACTION, BILATERAL  05/2017  . CHOLECYSTECTOMY N/A 02/28/2016   Procedure: LAPAROSCOPIC CHOLECYSTECTOMY WITH INTRAOPERATIVE CHOLANGIOGRAM;  Surgeon: Robert Bellow, MD;  Location: [facility] ORS;  Service: General;  Laterality: N/A;  . COLONOSCOPY  03/2013   int hem, 3 polyps no bx result available Vira Agar)  . DEXA  11/2012   WNL  . EYE SURGERY Left    laser  . UMBILICAL HERNIA REPAIR N/A 02/28/2016   Procedure: PRIMARY REPAIR VENTRAL/ UMBILICAL ADULT;  Surgeon: Robert Bellow, MD;  Location: [facility] OR[facility];  Service: General;  Laterality: N/A;   Social History   Tobacco Use  . Smoking status: Never Smoker  . Smokeless tobacco: Never Used  Substance Use Topics  . Alcohol use: No    Alcohol/week: 0.0 standard drinks  . Drug use: No   Family History  Problem Relation Age of Onset  . Glaucoma Mother   . CAD Mother   . Diabetes Mother   . Stroke Mother   . Cancer Father        unsure type  . Hypertension Brother        and sister   Allergies  Allergen Reactions  . Aleve [Naproxen Sodium] Hives, Itching and Swelling  . Citrus Swelling   Current Outpatient Medications on File Prior to  Visit  Medication Sig Dispense Refill  . acetaminophen (TYLENOL) 500 MG tablet Take 1,000 mg by mouth daily.    Marland Kitchen amLODipine (NORVASC) 5 MG tablet Take 1 tablet (5 mg total) by mouth daily. 90 tablet 3  . brimonidine (ALPHAGAN) 0.2 % ophthalmic solution     . Cholecalciferol (VITAMIN D-3) 1000 UNITS CAPS Take 1,000 Units by mouth daily.    . Coenzyme Q10 (COQ-10) 100 MG CAPS Take 100 mg by mouth daily.    . cyanocobalamin 500 MCG tablet Take 500 mcg by mouth daily.     . diclofenac (VOLTAREN) 75 MG EC tablet Take 1 tablet (75 mg total) by mouth 2 (two) times daily. For 5 days then as needed 30 tablet 0  . dorzolamide (TRUSOPT) 2 % ophthalmic solution PLACE 1 DROP INTO BOTH EYES TWICE A DAY  5  . latanoprost (XALATAN) 0.005  % ophthalmic solution   4  . methocarbamol (ROBAXIN) 500 MG tablet Take 1 tablet (500 mg total) by mouth 2 (two) times daily as needed for muscle spasms. 30 tablet 3  . Omega-3 Fatty Acids (FISH OIL) 1000 MG CAPS Take 1 capsule by mouth daily.    Marland Kitchen omeprazole (PRILOSEC) 40 MG capsule TAKE 1 CAPSULE(40 MG) BY MOUTH DAILY 30 capsule 3  . ondansetron (ZOFRAN) 4 MG tablet Take 1 tablet (4 mg total) by mouth every 8 (eight) hours as needed for nausea or vomiting. 20 tablet 0  . terbinafine (LAMISIL) 250 MG tablet Take 1 tablet (250 mg total) by mouth daily. 45 tablet 0  . timolol (TIMOPTIC) 0.5 % ophthalmic solution   4   No current facility-administered medications on file prior to visit.      Review of Systems  Constitutional: Negative for activity change, appetite change, fatigue, fever and unexpected weight change.  HENT: Positive for postnasal drip. Negative for congestion, ear pain, rhinorrhea, sinus pressure and sore throat.   Eyes: Negative for pain, redness and visual disturbance.  Respiratory: Positive for cough. Negative for apnea, choking, chest tightness, shortness of breath, wheezing and stridor.        2 episodes of hemoptysis   Cardiovascular: Negative for chest pain and palpitations.  Gastrointestinal: Negative for abdominal pain, blood in stool, constipation and diarrhea.  Endocrine: Negative for polydipsia and polyuria.  Genitourinary: Negative for dysuria, frequency and urgency.  Musculoskeletal: Negative for arthralgias, back pain and myalgias.  Skin: Negative for pallor and rash.  Allergic/Immunologic: Negative for environmental allergies.  Neurological: Negative for dizziness, syncope and headaches.  Hematological: Negative for adenopathy. Does not bruise/bleed easily.  Psychiatric/Behavioral: Negative for decreased concentration and dysphoric mood. The patient is not nervous/anxious.        Objective:   Physical Exam Vitals signs and nursing note reviewed.    Constitutional:      General: She is not in acute distress.    Appearance: Normal appearance. She is obese. She is not ill-appearing or diaphoretic.  HENT:     Head: Normocephalic and atraumatic.     Right Ear: Tympanic membrane and ear canal normal.     Left Ear: Tympanic membrane and ear canal normal.     Ears:     Comments: Scant cerumen     Nose: No congestion or rhinorrhea.     Mouth/Throat:     Mouth: Mucous membranes are moist.     Pharynx: Oropharynx is clear. No oropharyngeal exudate or posterior oropharyngeal erythema.  Eyes:     General:  Right eye: No discharge.        Left eye: No discharge.     Conjunctiva/sclera: Conjunctivae normal.     Pupils: Pupils are equal, round, and reactive to light.  Neck:     Musculoskeletal: No neck rigidity.  Cardiovascular:     Rate and Rhythm: Normal rate and regular rhythm.     Heart sounds: Normal heart sounds.  Pulmonary:     Effort: Pulmonary effort is normal. No respiratory distress.     Breath sounds: Normal breath sounds. No stridor. No wheezing, rhonchi or rales.     Comments: Good air exch  No rales/rhonchi or wheezing   Chest:     Chest wall: No tenderness.  Abdominal:     General: Abdomen is flat.  Lymphadenopathy:     Cervical: No cervical adenopathy.  Skin:    General: Skin is warm and dry.     Coloration: Skin is not jaundiced or pale.     Findings: No erythema or rash.  Neurological:     General: No focal deficit present.     Mental Status: She is alert.  Psychiatric:        Mood and Affect: Mood normal.     Comments: Pleasant            Assessment & Plan:   Problem List Items Addressed This Visit      Respiratory   Post-viral cough syndrome - Primary    S/p illness 3 weeks ago  Had 2 episodes of hemoptysis as well  Phlegm is now clear/scant and not thick (it was prev) Reassuring exam  cxr today in light of above  Enc her to start taking DM cough med otc on a schedule until cough  improves  If worse or fever- alert [place]         Other   Hemoptysis    I suspect this is from bronchial /throat trauma from uti and then post viral cough No smoking /chem exp/ or TB exposure  CXR today  Plan from there       Relevant Orders   DG Chest 2 View (Completed)

## 2018-06-25 NOTE — Assessment & Plan Note (Signed)
I suspect this is from bronchial /throat trauma from uti and then post viral cough No smoking /chem exp/ or TB exposure  CXR today  Plan from there

## 2018-07-11 HISTORY — PX: COLONOSCOPY WITH ESOPHAGOGASTRODUODENOSCOPY (EGD): SHX5779

## 2018-07-12 ENCOUNTER — Ambulatory Visit: Payer: BC Managed Care – PPO | Admitting: Family Medicine

## 2018-07-12 ENCOUNTER — Telehealth: Payer: Self-pay

## 2018-07-12 ENCOUNTER — Encounter: Payer: Self-pay | Admitting: Family Medicine

## 2018-07-12 VITALS — BP 136/82 | HR 74 | Temp 98.7°F | Ht 65.0 in | Wt 211.5 lb

## 2018-07-12 DIAGNOSIS — R300 Dysuria: Secondary | ICD-10-CM

## 2018-07-12 DIAGNOSIS — R3915 Urgency of urination: Secondary | ICD-10-CM | POA: Diagnosis not present

## 2018-07-12 LAB — POC URINALSYSI DIPSTICK (AUTOMATED)
BILIRUBIN UA: NEGATIVE
Glucose, UA: NEGATIVE
KETONES UA: NEGATIVE
Leukocytes, UA: NEGATIVE
Nitrite, UA: NEGATIVE
PH UA: 7 (ref 5.0–8.0)
Protein, UA: NEGATIVE
Spec Grav, UA: 1.01 (ref 1.010–1.025)
Urobilinogen, UA: 0.2 E.U./dL

## 2018-07-12 MED ORDER — CEPHALEXIN 500 MG PO CAPS: 500.0000 mg | ORAL_CAPSULE | Freq: Two times a day (BID) | ORAL | 0 refills | Status: DC

## 2018-07-12 NOTE — Assessment & Plan Note (Addendum)
Story/exam most consistent with UTI although UA/micro was not impressive - start keflex antibiotic. Supportive care as per instructions. Update if not improving with treatment. Will update with UCx results, discussed may need to stop abx pending results.

## 2018-07-12 NOTE — Progress Notes (Signed)
BP 136/82 (BP Location: Left Arm, Patient Position: Sitting, Cuff Size: Normal)   Pulse 74   Temp 98.7 F (37.1 C) (Oral)   Ht 5\' 5"  (1.651 m)   Wt 211 lb 8 oz (95.9 kg)   LMP 11/20/2015   SpO2 97%   BMI 35.20 kg/m    CC: UTI with blood Subjective:    Patient ID: [name], female    DOB: May 30, 1955, 63 y.o.   MRN: [id number]  HPI: Jadelynn Boylan is a 63 y.o. female presenting on 07/12/2018 for Urinary Frequency (C/o urinary frequency, burning with urination and blood in urine. Sxs started early this morning. )   Woke up this morning to use the restroom and noted discomfort with urination, then noted some blood on TP with wiping. Since then noticing dysuria, urgency, frequency, hematuria, blood with wiping, discomfort with sitting.   No fevers/chills, nausea/vomiting, new flank pain.  No recent abx use.  Limits sodas, has been drinking cranberry juice.   She has increased water intake.  No recent UTI. No h/o kidney stones.  Hasn't tried anything for this yet.      Relevant past medical, surgical, family and social history reviewed and updated as indicated. Interim medical history since our last visit reviewed. Allergies and medications reviewed and updated. Outpatient Medications Prior to Visit  Medication Sig Dispense Refill  . acetaminophen (TYLENOL) 500 MG tablet Take 1,000 mg by mouth daily.    Marland Kitchen amLODipine (NORVASC) 5 MG tablet Take 1 tablet (5 mg total) by mouth daily. 90 tablet 3  . brimonidine (ALPHAGAN) 0.2 % ophthalmic solution     . Cholecalciferol (VITAMIN D-3) 1000 UNITS CAPS Take 1,000 Units by mouth daily.    . Coenzyme Q10 (COQ-10) 100 MG CAPS Take 100 mg by mouth daily.    . cyanocobalamin 500 MCG tablet Take 500 mcg by mouth daily.     . diclofenac (VOLTAREN) 75 MG EC tablet Take 1 tablet (75 mg total) by mouth 2 (two) times daily. For 5 days then as needed 30 tablet 0  . dorzolamide (TRUSOPT) 2 % ophthalmic solution PLACE 1 DROP INTO BOTH EYES TWICE  A DAY  5  . latanoprost (XALATAN) 0.005 % ophthalmic solution   4  . methocarbamol (ROBAXIN) 500 MG tablet Take 1 tablet (500 mg total) by mouth 2 (two) times daily as needed for muscle spasms. 30 tablet 3  . Omega-3 Fatty Acids (FISH OIL) 1000 MG CAPS Take 1 capsule by mouth daily.    Marland Kitchen omeprazole (PRILOSEC) 40 MG capsule TAKE 1 CAPSULE(40 MG) BY MOUTH DAILY 30 capsule 3  . ondansetron (ZOFRAN) 4 MG tablet Take 1 tablet (4 mg total) by mouth every 8 (eight) hours as needed for nausea or vomiting. 20 tablet 0  . terbinafine (LAMISIL) 250 MG tablet Take 1 tablet (250 mg total) by mouth daily. 45 tablet 0  . timolol (TIMOPTIC) 0.5 % ophthalmic solution   4   No facility-administered medications prior to visit.      Per HPI unless specifically indicated in ROS section below Review of Systems Objective:    BP 136/82 (BP Location: Left Arm, Patient Position: Sitting, Cuff Size: Normal)   Pulse 74   Temp 98.7 F (37.1 C) (Oral)   Ht 5\' 5"  (1.651 m)   Wt 211 lb 8 oz (95.9 kg)   LMP 11/20/2015   SpO2 97%   BMI 35.20 kg/m   Wt Readings from Last 3 Encounters:  07/12/18 211 lb 8  oz (95.9 kg)  06/25/18 210 lb (95.3 kg)  02/25/18 207 lb 8 oz (94.1 kg)    Physical Exam Vitals signs and nursing note reviewed.  Constitutional:      General: She is not in acute distress.    Appearance: Normal appearance. She is not ill-appearing.  HENT:     Mouth/Throat:     Mouth: Mucous membranes are moist.     Pharynx: Oropharynx is clear.  Abdominal:     General: Abdomen is flat. Bowel sounds are normal. There is no distension.     Palpations: Abdomen is soft. There is no mass.     Tenderness: There is no abdominal tenderness. There is no right CVA tenderness, left CVA tenderness, guarding or rebound.     Hernia: No hernia is present.  Neurological:     Mental Status: She is alert.  Psychiatric:        Mood and Affect: Mood normal.       Results for orders placed or performed in visit on  07/12/18  POCT Urinalysis Dipstick (Automated)  Result Value Ref Range   Color, UA yellow    Clarity, UA clear    Glucose, UA Negative Negative   Bilirubin, UA negative    Ketones, UA negative    Spec Grav, UA 1.010 1.010 - 1.025   Blood, UA +/-    pH, UA 7.0 5.0 - 8.0   Protein, UA Negative Negative   Urobilinogen, UA 0.2 0.2 or 1.0 E.U./dL   Nitrite, UA negative    Leukocytes, UA Negative Negative  Micro: WBC rare RBC none Bact tr Casts none Epi none UCx sent Assessment & Plan:   Problem List Items Addressed This Visit    Urinary urgency - Primary    Story/exam most consistent with UTI although UA/micro was not impressive - start keflex antibiotic. Supportive care as per instructions. Update if not improving with treatment. Will update with UCx results, discussed may need to stop abx pending results.       Relevant Orders   Urine Culture    Other Visit Diagnoses    Dysuria       Relevant Orders   Urine Culture   POCT Urinalysis Dipstick (Automated) (Completed)       Meds ordered this encounter  Medications  . cephALEXin (KEFLEX) 500 MG capsule    Sig: Take 1 capsule (500 mg total) by mouth 2 (two) times daily.    Dispense:  14 capsule    Refill:  0   Orders Placed This Encounter  Procedures  . Urine Culture  . POCT Urinalysis Dipstick (Automated)    Follow up plan: Return if symptoms worsen or fail to improve.  Ria Bush, MD

## 2018-07-12 NOTE — Telephone Encounter (Signed)
Per chart review tab pt has appt with Dr Danise Mina 07/12/18 at 3:45.

## 2018-07-12 NOTE — Telephone Encounter (Signed)
[facility] Night - Client Nonclinical Telephone Record [facility] Night - Client Client Site [place] - Night Contact Type Call Who Is Calling Patient / Member / Family / Caregiver Caller Name [place] Phone Number [phone] Patient Name Martha Wade Patient DOB 06-16-1955 Call Type Message Only Information Provided Reason for Call Request to Schedule Office Appointment Initial Comment Would like to make an appt. Additional Comment Call Closed By: Mauri Pole Transaction Date/Time: 07/12/2018 7:45:39 AM (ET)

## 2018-07-12 NOTE — Patient Instructions (Signed)
Story most consistent with UTI although urine test today overall ok - treat with keflex antibiotic. We will send culture as well and contact you with results.  Push fluids and rest.  Take tylenol for discomfort, continue cranberry juice.   Urinary Tract Infection, Adult  A urinary tract infection (UTI) is an infection of any part of the urinary tract. The urinary tract includes the kidneys, ureters, bladder, and urethra. These organs make, store, and get rid of urine in the body. Your health care provider may use other names to describe the infection. An upper UTI affects the ureters and kidneys (pyelonephritis). A lower UTI affects the bladder (cystitis) and urethra (urethritis). What are the causes? Most urinary tract infections are caused by bacteria in your genital area, around the entrance to your urinary tract (urethra). These bacteria grow and cause inflammation of your urinary tract. What increases the risk? You are more likely to develop this condition if:  You have a urinary catheter that stays in place (indwelling).  You are not able to control when you urinate or have a bowel movement (you have incontinence).  You are female and you: ? Use a spermicide or diaphragm for birth control. ? Have low estrogen levels. ? Are pregnant.  You have certain genes that increase your risk (genetics).  You are sexually active.  You take antibiotic medicines.  You have a condition that causes your flow of urine to slow down, such as: ? An enlarged prostate, if you are female. ? Blockage in your urethra (stricture). ? A kidney stone. ? A nerve condition that affects your bladder control (neurogenic bladder). ? Not getting enough to drink, or not urinating often.  You have certain medical conditions, such as: ? Diabetes. ? A weak disease-fighting system (immunesystem). ? Sickle cell disease. ? Gout. ? Spinal cord injury. What are the signs or symptoms? Symptoms of this condition  include:  Needing to urinate right away (urgently).  Frequent urination or passing small amounts of urine frequently.  Pain or burning with urination.  Blood in the urine.  Urine that smells bad or unusual.  Trouble urinating.  Cloudy urine.  Vaginal discharge, if you are female.  Pain in the abdomen or the lower back. You may also have:  Vomiting or a decreased appetite.  Confusion.  Irritability or tiredness.  A fever.  Diarrhea. The first symptom in older adults may be confusion. In some cases, they may not have any symptoms until the infection has worsened. How is this diagnosed? This condition is diagnosed based on your medical history and a physical exam. You may also have other tests, including:  Urine tests.  Blood tests.  Tests for sexually transmitted infections (STIs). If you have had more than one UTI, a cystoscopy or imaging studies may be done to determine the cause of the infections. How is this treated? Treatment for this condition includes:  Antibiotic medicine.  Over-the-counter medicines to treat discomfort.  Drinking enough water to stay hydrated. If you have frequent infections or have other conditions such as a kidney stone, you may need to see a health care provider who specializes in the urinary tract (urologist). In rare cases, urinary tract infections can cause sepsis. Sepsis is a life-threatening condition that occurs when the body responds to an infection. Sepsis is treated in the hospital with IV antibiotics, fluids, and other medicines. Follow these instructions at home:  Medicines  Take over-the-counter and prescription medicines only as told by your health care provider.  If you were prescribed an antibiotic medicine, take it as told by your health care provider. Do not stop using the antibiotic even if you start to feel better. General instructions  Make sure you: ? Empty your bladder often and completely. Do not hold urine  for long periods of time. ? Empty your bladder after sex. ? Wipe from front to back after a bowel movement if you are female. Use each tissue one time when you wipe.  Drink enough fluid to keep your urine pale yellow.  Keep all follow-up visits as told by your health care provider. This is important. Contact a health care provider if:  Your symptoms do not get better after 1-2 days.  Your symptoms go away and then return. Get help right away if you have:  Severe pain in your back or your lower abdomen.  A fever.  Nausea or vomiting. Summary  A urinary tract infection (UTI) is an infection of any part of the urinary tract, which includes the kidneys, ureters, bladder, and urethra.  Most urinary tract infections are caused by bacteria in your genital area, around the entrance to your urinary tract (urethra).  Treatment for this condition often includes antibiotic medicines.  If you were prescribed an antibiotic medicine, take it as told by your health care provider. Do not stop using the antibiotic even if you start to feel better.  Keep all follow-up visits as told by your health care provider. This is important. This information is not intended to replace advice given to you by your health care provider. Make sure you discuss any questions you have with your health care provider. Document Released: 02/05/2005 Document Revised: 11/05/2017 Document Reviewed: 11/05/2017 Elsevier Interactive Patient Education  2019 [employer].

## 2018-07-13 LAB — URINE CULTURE
MICRO NUMBER:: 263631
SPECIMEN QUALITY:: ADEQUATE

## 2018-07-29 ENCOUNTER — Encounter: Payer: Self-pay | Admitting: Family Medicine

## 2018-11-02 ENCOUNTER — Encounter: Payer: Self-pay | Admitting: Family Medicine

## 2018-11-04 MED ORDER — CEPHALEXIN 500 MG PO CAPS: 500.0000 mg | ORAL_CAPSULE | Freq: Three times a day (TID) | ORAL | 0 refills | Status: DC

## 2018-11-08 ENCOUNTER — Other Ambulatory Visit: Payer: Self-pay | Admitting: Family Medicine

## 2019-04-06 ENCOUNTER — Other Ambulatory Visit: Payer: Self-pay

## 2019-04-06 MED ORDER — OMEPRAZOLE 40 MG PO CPDR: DELAYED_RELEASE_CAPSULE | ORAL | 1 refills | Status: DC

## 2019-04-06 NOTE — Telephone Encounter (Signed)
E-scribed refill 

## 2019-04-08 NOTE — Telephone Encounter (Signed)
plz phone in due to Erx failure

## 2019-04-11 NOTE — Telephone Encounter (Signed)
Refill left on vm at pharmacy.  

## 2019-05-09 ENCOUNTER — Encounter: Payer: Self-pay | Admitting: Family Medicine

## 2019-05-09 ENCOUNTER — Other Ambulatory Visit: Payer: Self-pay

## 2019-05-09 ENCOUNTER — Ambulatory Visit: Payer: BC Managed Care – PPO | Admitting: Family Medicine

## 2019-05-09 VITALS — BP 120/68 | HR 83 | Temp 97.7°F | Ht 65.0 in | Wt 216.2 lb

## 2019-05-09 DIAGNOSIS — S6992XA Unspecified injury of left wrist, hand and finger(s), initial encounter: Secondary | ICD-10-CM

## 2019-05-09 MED ORDER — DICLOFENAC SODIUM 75 MG PO TBEC: 75.0000 mg | DELAYED_RELEASE_TABLET | Freq: Every day | ORAL | 0 refills | Status: DC

## 2019-05-09 MED ORDER — DICLOFENAC SODIUM 1 % EX GEL: 2.0000 g | Freq: Three times a day (TID) | CUTANEOUS | 1 refills | Status: DC

## 2019-05-09 NOTE — Progress Notes (Signed)
This visit was conducted in person.  BP 120/68 (BP Location: Left Arm, Patient Position: Sitting, Cuff Size: Large)   Pulse 83   Temp 97.7 F (36.5 C) (Temporal)   Ht 5\' 5"  (1.651 m)   Wt 216 lb 3 oz (98.1 kg)   LMP 11/20/2015   SpO2 97%   BMI 35.98 kg/m    CC: finger injury Subjective:    Patient ID: Martha Wade, female    DOB: 1956-03-05, 63 y.o.   MRN: [id number]  HPI: Martha Wade is a 63 y.o. female presenting on 05/09/2019 for Hand Pain (C/o left middle finger pain.  Injured finger about 2 mos ago.  Tried Advil and aspirin, barely helpful. )   DOI: 2 months ago  L middle finger injury - jammed into door of van. Since then pain and swelling at 3rd PIP. Tender to the touch, intermittent swelling. No significant redness or warmth of joint. Limited ROM.   Taking advil, aspirin PRN with benefit.      Relevant past medical, surgical, family and social history reviewed and updated as indicated. Interim medical history since our last visit reviewed. Allergies and medications reviewed and updated. Outpatient Medications Prior to Visit  Medication Sig Dispense Refill  . acetaminophen (TYLENOL) 500 MG tablet Take 1,000 mg by mouth daily.    Marland Kitchen amLODipine (NORVASC) 5 MG tablet Take 1 tablet (5 mg total) by mouth daily. 90 tablet 3  . brimonidine (ALPHAGAN) 0.2 % ophthalmic solution     . Cholecalciferol (VITAMIN D-3) 1000 UNITS CAPS Take 1,000 Units by mouth daily.    . Coenzyme Q10 (COQ-10) 100 MG CAPS Take 100 mg by mouth daily.    . cyanocobalamin 500 MCG tablet Take 500 mcg by mouth daily.     . dorzolamide (TRUSOPT) 2 % ophthalmic solution PLACE 1 DROP INTO BOTH EYES TWICE A DAY  5  . latanoprost (XALATAN) 0.005 % ophthalmic solution   4  . methocarbamol (ROBAXIN) 500 MG tablet Take 1 tablet (500 mg total) by mouth 2 (two) times daily as needed for muscle spasms. 30 tablet 3  . Omega-3 Fatty Acids (FISH OIL) 1000 MG CAPS Take 1 capsule by mouth daily.    Marland Kitchen omeprazole  (PRILOSEC) 40 MG capsule TAKE 1 CAPSULE(40 MG) BY MOUTH DAILY 30 capsule 1  . ondansetron (ZOFRAN) 4 MG tablet Take 1 tablet (4 mg total) by mouth every 8 (eight) hours as needed for nausea or vomiting. 20 tablet 0  . terbinafine (LAMISIL) 250 MG tablet Take 1 tablet (250 mg total) by mouth daily. 45 tablet 0  . timolol (TIMOPTIC) 0.5 % ophthalmic solution   4  . diclofenac (VOLTAREN) 75 MG EC tablet Take 1 tablet (75 mg total) by mouth 2 (two) times daily. For 5 days then as needed 30 tablet 0  . cephALEXin (KEFLEX) 500 MG capsule Take 1 capsule (500 mg total) by mouth 3 (three) times daily. 21 capsule 0   No facility-administered medications prior to visit.     Per HPI unless specifically indicated in ROS section below Review of Systems Objective:    BP 120/68 (BP Location: Left Arm, Patient Position: Sitting, Cuff Size: Large)   Pulse 83   Temp 97.7 F (36.5 C) (Temporal)   Ht 5\' 5"  (1.651 m)   Wt 216 lb 3 oz (98.1 kg)   LMP 11/20/2015   SpO2 97%   BMI 35.98 kg/m   Wt Readings from Last 3 Encounters:  05/09/19 216 lb 3  oz (98.1 kg)  07/12/18 211 lb 8 oz (95.9 kg)  06/25/18 210 lb (95.3 kg)    Physical Exam Vitals and nursing note reviewed.  Constitutional:      Appearance: Normal appearance. She is not ill-appearing.  Musculoskeletal:        General: Swelling (mild) and tenderness present. No deformity. Normal range of motion.     Comments: 2+ rad pulses bilaterally. Discomfort with some swelling present at L hand 3rd PIP joint, overall preserved ROM, able to flex DIP in isolation.   Skin:    Capillary Refill: Capillary refill takes less than 2 seconds.  Neurological:     Mental Status: She is alert.     Sensory: Sensation is intact.       Assessment & Plan:  This visit occurred during the SARS-CoV-2 public health emergency.  Safety protocols were in place, including screening questions prior to the visit, additional usage of staff PPE, and extensive cleaning of exam  room while observing appropriate contact time as indicated for disinfecting solutions.   Problem List Items Addressed This Visit    Injury of left middle finger    Jamming injury occurred 2 months ago, now with residual pain and swelling present at 3rd PIP joint. ROM largely preserved - not consistent with fracture or dislocation. Anticipate finger sprain with residual irritation of joint. Treat with diclofenac tablets 75mg  once daily (has tolerated well despite naprosyn allergy) and diclofenac topical gel TID PRN. Update if not improving to consider imaging.           Meds ordered this encounter  Medications  . diclofenac Sodium (VOLTAREN) 1 % GEL    Sig: Apply 2 g topically 3 (three) times daily.    Dispense:  100 g    Refill:  1  . diclofenac (VOLTAREN) 75 MG EC tablet    Sig: Take 1 tablet (75 mg total) by mouth daily. For 1 week then as needed    Dispense:  30 tablet    Refill:  0   No orders of the defined types were placed in this encounter.   Patient Instructions  I think you have persistent pain and swelling from prior finger strain. Treat with voltaren gel topically 3 times daily as needed, and do short course of oral anti inflammatory diclofenac (voltaren) one daily for 1 week then as needed.  Heating pad, warm compresses as needed as well.     Follow up plan: Return if symptoms worsen or fail to improve.  Ria Bush, MD

## 2019-05-09 NOTE — Patient Instructions (Addendum)
I think you have persistent pain and swelling from prior finger strain. Treat with voltaren gel topically 3 times daily as needed, and do short course of oral anti inflammatory diclofenac (voltaren) one daily for 1 week then as needed.  Heating pad, warm compresses as needed as well.

## 2019-05-10 DIAGNOSIS — S6992XA Unspecified injury of left wrist, hand and finger(s), initial encounter: Secondary | ICD-10-CM | POA: Insufficient documentation

## 2019-05-10 NOTE — Assessment & Plan Note (Signed)
Jamming injury occurred 2 months ago, now with residual pain and swelling present at 3rd PIP joint. ROM largely preserved - not consistent with fracture or dislocation. Anticipate finger sprain with residual irritation of joint. Treat with diclofenac tablets 75mg  once daily (has tolerated well despite naprosyn allergy) and diclofenac topical gel TID PRN. Update if not improving to consider imaging.

## 2019-05-11 ENCOUNTER — Telehealth: Payer: Self-pay

## 2019-05-11 NOTE — Telephone Encounter (Signed)
Filled in Lisa's box. If not approved, pt will need to pay out of pocket as it's now over the counter.

## 2019-05-11 NOTE — Telephone Encounter (Signed)
Noted.  Form faxed.

## 2019-05-11 NOTE — Telephone Encounter (Signed)
Received faxed PA form for diclofenac gel.  Placed form in Dr. Synthia Innocent box.

## 2019-05-16 NOTE — Telephone Encounter (Addendum)
Noted. plz notify pt /family if not already done.

## 2019-05-16 NOTE — Telephone Encounter (Signed)
PA approved for dates: 05/11/2019-05/10/2022. Placing fax from insurance on Dr [employer].

## 2019-05-18 NOTE — Telephone Encounter (Signed)
Left VM that PA for diclofenac had been approved. Advised patient to call back if any questions.

## 2019-06-06 ENCOUNTER — Other Ambulatory Visit: Payer: Self-pay | Admitting: Family Medicine

## 2019-06-23 ENCOUNTER — Other Ambulatory Visit: Payer: Self-pay | Admitting: Family Medicine

## 2019-06-23 DIAGNOSIS — Z1231 Encounter for screening mammogram for malignant neoplasm of breast: Secondary | ICD-10-CM

## 2019-07-04 ENCOUNTER — Other Ambulatory Visit: Payer: Self-pay | Admitting: Family Medicine

## 2019-07-18 ENCOUNTER — Ambulatory Visit
Admission: RE | Admit: 2019-07-18 | Discharge: 2019-07-18 | Disposition: A | Discharge status: Home / Self Care | Payer: BC Managed Care – PPO | Source: Ambulatory Visit | Attending: Family Medicine | Admitting: Family Medicine

## 2019-07-18 DIAGNOSIS — Z1231 Encounter for screening mammogram for malignant neoplasm of breast: Secondary | ICD-10-CM

## 2019-07-18 LAB — HM MAMMOGRAPHY

## 2019-07-19 ENCOUNTER — Encounter: Payer: Self-pay | Admitting: Family Medicine

## 2019-07-22 ENCOUNTER — Other Ambulatory Visit (INDEPENDENT_AMBULATORY_CARE_PROVIDER_SITE_OTHER): Payer: BC Managed Care – PPO

## 2019-07-22 ENCOUNTER — Other Ambulatory Visit: Payer: Self-pay

## 2019-07-22 ENCOUNTER — Other Ambulatory Visit: Payer: Self-pay | Admitting: Family Medicine

## 2019-07-22 DIAGNOSIS — E559 Vitamin D deficiency, unspecified: Secondary | ICD-10-CM

## 2019-07-22 DIAGNOSIS — E785 Hyperlipidemia, unspecified: Secondary | ICD-10-CM

## 2019-07-22 DIAGNOSIS — I10 Essential (primary) hypertension: Secondary | ICD-10-CM | POA: Diagnosis not present

## 2019-07-22 DIAGNOSIS — N289 Disorder of kidney and ureter, unspecified: Secondary | ICD-10-CM

## 2019-07-22 LAB — CBC WITH DIFFERENTIAL/PLATELET
Basophils Absolute: 0 10*3/uL (ref 0.0–0.1)
Basophils Relative: 0.6 % (ref 0.0–3.0)
Eosinophils Absolute: 0.1 10*3/uL (ref 0.0–0.7)
Eosinophils Relative: 2.2 % (ref 0.0–5.0)
HCT: 39 % (ref 36.0–46.0)
Hemoglobin: 13 g/dL (ref 12.0–15.0)
Lymphocytes Relative: 34.5 % (ref 12.0–46.0)
Lymphs Abs: 2.3 10*3/uL (ref 0.7–4.0)
MCHC: 33.4 g/dL (ref 30.0–36.0)
MCV: 92.3 fl (ref 78.0–100.0)
Monocytes Absolute: 0.7 10*3/uL (ref 0.1–1.0)
Monocytes Relative: 10.8 % (ref 3.0–12.0)
Neutro Abs: 3.4 10*3/uL (ref 1.4–7.7)
Neutrophils Relative %: 51.9 % (ref 43.0–77.0)
Platelets: 242 10*3/uL (ref 150.0–400.0)
RBC: 4.23 Mil/uL (ref 3.87–5.11)
RDW: 13.7 % (ref 11.5–15.5)
WBC: 6.6 10*3/uL (ref 4.0–10.5)

## 2019-07-22 LAB — COMPREHENSIVE METABOLIC PANEL
ALT: 20 U/L (ref 0–35)
AST: 17 U/L (ref 0–37)
Albumin: 4.1 g/dL (ref 3.5–5.2)
Alkaline Phosphatase: 75 U/L (ref 39–117)
BUN: 18 mg/dL (ref 6–23)
CO2: 28 mEq/L (ref 19–32)
Calcium: 9.5 mg/dL (ref 8.4–10.5)
Chloride: 105 mEq/L (ref 96–112)
Creatinine, Ser: 1.16 mg/dL (ref 0.40–1.20)
GFR: 57.15 mL/min — ABNORMAL LOW (ref >60.00)
Glucose, Bld: 106 mg/dL — ABNORMAL HIGH (ref 70–99)
Potassium: 3.6 mEq/L (ref 3.5–5.1)
Sodium: 140 mEq/L (ref 135–145)
Total Bilirubin: 0.5 mg/dL (ref 0.2–1.2)
Total Protein: 8.2 g/dL (ref 6.0–8.3)

## 2019-07-22 LAB — LIPID PANEL
Cholesterol: 247 mg/dL — ABNORMAL HIGH (ref 0–200)
HDL: 45.5 mg/dL (ref >39.00)
LDL Cholesterol: 186 mg/dL — ABNORMAL HIGH (ref 0–99)
NonHDL: 201.89
Total CHOL/HDL Ratio: 5
Triglycerides: 81 mg/dL (ref 0.0–149.0)
VLDL: 16.2 mg/dL (ref 0.0–40.0)

## 2019-07-22 LAB — MICROALBUMIN / CREATININE URINE RATIO
Creatinine,U: 357.6 mg/dL
Microalb Creat Ratio: 1.2 mg/g (ref 0.0–30.0)
Microalb, Ur: 4.1 mg/dL — ABNORMAL HIGH (ref 0.0–1.9)

## 2019-07-22 LAB — VITAMIN D 25 HYDROXY (VIT D DEFICIENCY, FRACTURES): VITD: 40.64 ng/mL (ref 30.00–100.00)

## 2019-07-29 ENCOUNTER — Ambulatory Visit (INDEPENDENT_AMBULATORY_CARE_PROVIDER_SITE_OTHER): Payer: BC Managed Care – PPO | Admitting: Family Medicine

## 2019-07-29 ENCOUNTER — Encounter: Payer: Self-pay | Admitting: Family Medicine

## 2019-07-29 ENCOUNTER — Other Ambulatory Visit: Payer: Self-pay

## 2019-07-29 VITALS — BP 122/82 | HR 86 | Temp 97.3°F | Ht 65.0 in | Wt 216.5 lb

## 2019-07-29 DIAGNOSIS — Z0000 Encounter for general adult medical examination without abnormal findings: Secondary | ICD-10-CM

## 2019-07-29 DIAGNOSIS — N289 Disorder of kidney and ureter, unspecified: Secondary | ICD-10-CM

## 2019-07-29 DIAGNOSIS — E785 Hyperlipidemia, unspecified: Secondary | ICD-10-CM | POA: Diagnosis not present

## 2019-07-29 DIAGNOSIS — E559 Vitamin D deficiency, unspecified: Secondary | ICD-10-CM | POA: Diagnosis not present

## 2019-07-29 DIAGNOSIS — I10 Essential (primary) hypertension: Secondary | ICD-10-CM

## 2019-07-29 DIAGNOSIS — K2100 Gastro-esophageal reflux disease with esophagitis, without bleeding: Secondary | ICD-10-CM

## 2019-07-29 MED ORDER — AMLODIPINE BESYLATE 5 MG PO TABS: 5.0000 mg | ORAL_TABLET | Freq: Every day | ORAL | 3 refills | Status: DC

## 2019-07-29 MED ORDER — OMEPRAZOLE 40 MG PO CPDR: DELAYED_RELEASE_CAPSULE | ORAL | 3 refills | Status: DC

## 2019-07-29 MED ORDER — ATORVASTATIN CALCIUM 20 MG PO TABS: 20.0000 mg | ORAL_TABLET | Freq: Every day | ORAL | 3 refills | Status: DC

## 2019-07-29 NOTE — Patient Instructions (Addendum)
Schedule well woman exam at [facility] at your convenience.  [email or web address] or check at [facility] for covid shot.  Start atorvastatin 20mg  daily - take at night time. If trouble with muscle aches, decrease to every other day.  Return as needed or in 6 months for cholesterol recheck.   Health Maintenance for Postmenopausal Women Menopause is a normal process in which your ability to get pregnant comes to an end. This process happens slowly over many months or years, usually between the ages of 45 and 42. Menopause is complete when you have missed your menstrual periods for 12 months. It is important to talk with your health care provider about some of the most common conditions that affect women after menopause (postmenopausal women). These include heart disease, cancer, and bone loss (osteoporosis). Adopting a healthy lifestyle and getting preventive care can help to promote your health and wellness. The actions you take can also lower your chances of developing some of these common conditions. What should I know about menopause? During menopause, you may get a number of symptoms, such as:  Hot flashes. These can be moderate or severe.  Night sweats.  Decrease in sex drive.  Mood swings.  Headaches.  Tiredness.  Irritability.  Memory problems.  Insomnia. Choosing to treat or not to treat these symptoms is a decision that you make with your health care provider. Do I need hormone replacement therapy?  Hormone replacement therapy is effective in treating symptoms that are caused by menopause, such as hot flashes and night sweats.  Hormone replacement carries certain risks, especially as you become older. If you are thinking about using estrogen or estrogen with progestin, discuss the benefits and risks with your health care provider. What is my risk for heart disease and stroke? The risk of heart disease, heart attack, and stroke increases as you age. One of the causes may be a  change in the body's hormones during menopause. This can affect how your body uses dietary fats, triglycerides, and cholesterol. Heart attack and stroke are medical emergencies. There are many things that you can do to help prevent heart disease and stroke. Watch your blood pressure  High blood pressure causes heart disease and increases the risk of stroke. This is more likely to develop in people who have high blood pressure readings, are of African descent, or are overweight.  Have your blood pressure checked: ? Every 3-5 years if you are 87-84 years of age. ? Every year if you are 35 years old or older. Eat a healthy diet   Eat a diet that includes plenty of vegetables, fruits, low-fat dairy products, and lean protein.  Do not eat a lot of foods that are high in solid fats, added sugars, or sodium. Get regular exercise Get regular exercise. This is one of the most important things you can do for your health. Most adults should:  Try to exercise for at least 150 minutes each week. The exercise should increase your heart rate and make you sweat (moderate-intensity exercise).  Try to do strengthening exercises at least twice each week. Do these in addition to the moderate-intensity exercise.  Spend less time sitting. Even light physical activity can be beneficial. Other tips  Work with your health care provider to achieve or maintain a healthy weight.  Do not use any products that contain nicotine or tobacco, such as cigarettes, e-cigarettes, and chewing tobacco. If you need help quitting, ask your health care provider.  Know your numbers. Ask your health care  provider to check your cholesterol and your blood sugar (glucose). Continue to have your blood tested as directed by your health care provider. Do I need screening for cancer? Depending on your health history and family history, you may need to have cancer screening at different stages of your life. This may include screening  for:  Breast cancer.  Cervical cancer.  Lung cancer.  Colorectal cancer. What is my risk for osteoporosis? After menopause, you may be at increased risk for osteoporosis. Osteoporosis is a condition in which bone destruction happens more quickly than new bone creation. To help prevent osteoporosis or the bone fractures that can happen because of osteoporosis, you may take the following actions:  If you are 30-54 years old, get at least 1,000 mg of calcium and at least 600 mg of vitamin D per day.  If you are older than age 92 but younger than age 26, get at least 1,200 mg of calcium and at least 600 mg of vitamin D per day.  If you are older than age 46, get at least 1,200 mg of calcium and at least 800 mg of vitamin D per day. Smoking and drinking excessive alcohol increase the risk of osteoporosis. Eat foods that are rich in calcium and vitamin D, and do weight-bearing exercises several times each week as directed by your health care provider. How does menopause affect my mental health? Depression may occur at any age, but it is more common as you become older. Common symptoms of depression include:  Low or sad mood.  Changes in sleep patterns.  Changes in appetite or eating patterns.  Feeling an overall lack of motivation or enjoyment of activities that you previously enjoyed.  Frequent crying spells. Talk with your health care provider if you think that you are experiencing depression. General instructions See your health care provider for regular wellness exams and vaccines. This may include:  Scheduling regular health, dental, and eye exams.  Getting and maintaining your vaccines. These include: ? Influenza vaccine. Get this vaccine each year before the flu season begins. ? Pneumonia vaccine. ? Shingles vaccine. ? Tetanus, diphtheria, and pertussis (Tdap) booster vaccine. Your health care provider may also recommend other immunizations. Tell your health care provider  if you have ever been abused or do not feel safe at home. Summary  Menopause is a normal process in which your ability to get pregnant comes to an end.  This condition causes hot flashes, night sweats, decreased interest in sex, mood swings, headaches, or lack of sleep.  Treatment for this condition may include hormone replacement therapy.  Take actions to keep yourself healthy, including exercising regularly, eating a healthy diet, watching your weight, and checking your blood pressure and blood sugar levels.  Get screened for cancer and depression. Make sure that you are up to date with all your vaccines. This information is not intended to replace advice given to you by your health care provider. Make sure you discuss any questions you have with your health care provider. Document Revised: 04/21/2018 Document Reviewed: 04/21/2018 Elsevier Patient Education  2020 [employer].

## 2019-07-29 NOTE — Assessment & Plan Note (Addendum)
Chronic, not on meds. Reviewed risks/benefits of starting statin to lower cardiovascular risk. Agrees to restart atorvastatin 20mg . Reassess at 6 mo f/u visit.  The 10-year ASCVD risk score [name]., et al., 2013) is: 8.9%   Values used to calculate the score:     Age: 64 years     Sex: Female     Is Non-Hispanic African American: Yes     Diabetic: No     Tobacco smoker: No     Systolic Blood Pressure: 123XX123 mmHg     Is BP treated: Yes     HDL Cholesterol: 45.5 mg/dL     Total Cholesterol: 247 mg/dL

## 2019-07-29 NOTE — Assessment & Plan Note (Addendum)
Preventative protocols reviewed and updated unless pt declined. Discussed healthy diet and lifestyle.  Encouraged she call to schedule well woman exam with [facility].  Provided info on how to sign up for covid vaccines.

## 2019-07-29 NOTE — Progress Notes (Signed)
This visit was conducted in person.  BP 122/82 (BP Location: Left Arm, Patient Position: Sitting, Cuff Size: Normal)   Pulse 86   Temp (!) 97.3 F (36.3 C) (Temporal)   Ht 5\' 5"  (1.651 m)   Wt 216 lb 8 oz (98.2 kg)   LMP 11/20/2015   SpO2 97%   BMI 36.03 kg/m    CC: CPE Subjective:    Patient ID: Martha Wade, female    DOB: [birth year], 64 y.o.   MRN: [id number]  HPI: Martha Wade is a 64 y.o. female presenting on 07/29/2019 for Annual Exam (No new concerns)   Has been working from home for the past year. Weight gain noted.   Preventative: COLONOSCOPY WITH ESOPHAGOGASTRODUODENOSCOPY (EGD) 07/2018 - colon - int hem, rpt 5 yrs, EGD - reflux esophagitis Vira Agar) Well woman - last pap 09/2015 - WNL except endocervical zone absent. She will return to [facility] for well woman.  Mammogram - 07/2019 Birads1  DEXA 11/2012 - WNL  Flu shot - declines Tdap 12/2012 Shingrix - discussed  COVID vaccine - interested  Seat belt use discussed Sunscreen use discussed, no changing moles on skin.  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 Interim medical history since our last visit reviewed. Allergies and medications reviewed and updated. Outpatient Medications Prior to Visit  Medication Sig Dispense Refill  . acetaminophen (TYLENOL) 500 MG tablet Take 1,000 mg by mouth daily.    . brimonidine (ALPHAGAN) 0.2 % ophthalmic solution     . Cholecalciferol (VITAMIN D-3) 1000 UNITS CAPS Take 1,000 Units by mouth daily.    . Coenzyme Q10 (COQ-10) 100 MG CAPS  Take 100 mg by mouth daily.    . cyanocobalamin 500 MCG tablet Take 500 mcg by mouth daily.     . diclofenac Sodium (VOLTAREN) 1 % GEL Apply 2 g topically 3 (three) times daily. 100 g 1  . dorzolamide (TRUSOPT) 2 % ophthalmic solution PLACE 1 DROP INTO BOTH EYES TWICE A DAY  5  . latanoprost (XALATAN) 0.005 % ophthalmic solution   4  . methocarbamol (ROBAXIN) 500 MG tablet Take 1 tablet (500 mg total) by mouth 2 (two) times daily as needed for muscle spasms. 30 tablet 3  . Omega-3 Fatty Acids (FISH OIL) 1000 MG CAPS Take 1 capsule by mouth daily.    . ondansetron (ZOFRAN) 4 MG tablet Take 1 tablet (4 mg total) by mouth every 8 (eight) hours as needed for nausea or vomiting. 20 tablet 0  . terbinafine (LAMISIL) 250 MG tablet Take 1 tablet (250 mg total) by mouth daily. 45 tablet 0  . timolol (TIMOPTIC) 0.5 % ophthalmic solution   4  . amLODipine (NORVASC) 5 MG tablet Take 1 tablet (5 mg total) by mouth daily. 90 tablet 3  . omeprazole (PRILOSEC) 40 MG capsule TAKE 1 CAPSULE(40 MG) BY MOUTH DAILY 30 capsule 1  . diclofenac (VOLTAREN) 75 MG EC tablet TAKE 1 TABLET(75 MG) BY MOUTH DAILY FOR 1 WEEK THEN AS NEEDED (Patient not taking: Reported on 07/29/2019) 30 tablet 0   No facility-administered medications  prior to visit.     Per HPI unless specifically indicated in ROS section below Review of Systems  Constitutional: Negative for activity change, appetite change, chills, fatigue, fever and unexpected weight change.  HENT: Negative for hearing loss.   Eyes: Negative for visual disturbance.  Respiratory: Negative for cough, chest tightness, shortness of breath and wheezing.   Cardiovascular: Negative for chest pain, palpitations and leg swelling.  Gastrointestinal: Negative for abdominal distention, abdominal pain, blood in stool, constipation, diarrhea, nausea and vomiting.  Genitourinary: Negative for difficulty urinating and hematuria.  Musculoskeletal: Negative for arthralgias, myalgias and  neck pain.  Skin: Negative for rash.  Neurological: Positive for dizziness (rare). Negative for seizures, syncope and headaches.  Hematological: Negative for adenopathy. Does not bruise/bleed easily.  Psychiatric/Behavioral: Negative for dysphoric mood. The patient is not nervous/anxious.    Objective:    BP 122/82 (BP Location: Left Arm, Patient Position: Sitting, Cuff Size: Normal)   Pulse 86   Temp (!) 97.3 F (36.3 C) (Temporal)   Ht 5\' 5"  (1.651 m)   Wt 216 lb 8 oz (98.2 kg)   LMP 11/20/2015   SpO2 97%   BMI 36.03 kg/m   Wt Readings from Last 3 Encounters:  07/29/19 216 lb 8 oz (98.2 kg)  05/09/19 216 lb 3 oz (98.1 kg)  07/12/18 211 lb 8 oz (95.9 kg)    Physical Exam Vitals and nursing note reviewed.  Constitutional:      General: She is not in acute distress.    Appearance: Normal appearance. She is well-developed. She is not ill-appearing.  HENT:     Head: Normocephalic and atraumatic.     Right Ear: Hearing, tympanic membrane, ear canal and external ear normal.     Left Ear: Hearing, tympanic membrane, ear canal and external ear normal.     Mouth/Throat:     Pharynx: Uvula midline.  Eyes:     General: No scleral icterus.    Extraocular Movements: Extraocular movements intact.     Conjunctiva/sclera: Conjunctivae normal.     Pupils: Pupils are equal, round, and reactive to light.  Cardiovascular:     Rate and Rhythm: Normal rate and regular rhythm.     Pulses: Normal pulses.          Radial pulses are 2+ on the right side and 2+ on the left side.     Heart sounds: Normal heart sounds. No murmur.  Pulmonary:     Effort: Pulmonary effort is normal. No respiratory distress.     Breath sounds: Normal breath sounds. No wheezing, rhonchi or rales.  Abdominal:     General: Abdomen is flat. Bowel sounds are normal. There is no distension.     Palpations: Abdomen is soft. There is no mass.     Tenderness: There is no abdominal tenderness. There is no guarding or  rebound.     Hernia: No hernia is present.  Musculoskeletal:        General: Normal range of motion.     Cervical back: Normal range of motion and neck supple.     Right lower leg: No edema.     Left lower leg: No edema.  Lymphadenopathy:     Cervical: No cervical adenopathy.  Skin:    General: Skin is warm and dry.     Findings: No rash.  Neurological:     General: No focal deficit present.     Mental Status: She is alert and oriented to person, place, and time.  Comments: CN grossly intact, station and gait intact  Psychiatric:        Mood and Affect: Mood normal.        Behavior: Behavior normal.        Thought Content: Thought content normal.        Judgment: Judgment normal.       Results for orders placed or performed in visit on 07/22/19  CBC with Differential/Platelet  Result Value Ref Range   WBC 6.6 4.0 - 10.5 K/uL   RBC 4.23 3.87 - 5.11 Mil/uL   Hemoglobin 13.0 12.0 - 15.0 g/dL   HCT 39.0 36.0 - 46.0 %   MCV 92.3 78.0 - 100.0 fl   MCHC 33.4 30.0 - 36.0 g/dL   RDW 13.7 11.5 - 15.5 %   Platelets 242.0 150.0 - 400.0 K/uL   Neutrophils Relative % 51.9 43.0 - 77.0 %   Lymphocytes Relative 34.5 12.0 - 46.0 %   Monocytes Relative 10.8 3.0 - 12.0 %   Eosinophils Relative 2.2 0.0 - 5.0 %   Basophils Relative 0.6 0.0 - 3.0 %   Neutro Abs 3.4 1.4 - 7.7 K/uL   Lymphs Abs 2.3 0.7 - 4.0 K/uL   Monocytes Absolute 0.7 0.1 - 1.0 K/uL   Eosinophils Absolute 0.1 0.0 - 0.7 K/uL   Basophils Absolute 0.0 0.0 - 0.1 K/uL  Microalbumin / creatinine urine ratio  Result Value Ref Range   Microalb, Ur 4.1 (H) 0.0 - 1.9 mg/dL   Creatinine,U 357.6 mg/dL   Microalb Creat Ratio 1.2 0.0 - 30.0 mg/g  Lipid panel  Result Value Ref Range   Cholesterol 247 (H) 0 - 200 mg/dL   Triglycerides 81.0 0.0 - 149.0 mg/dL   HDL 45.50 >39.00 mg/dL   VLDL 16.2 0.0 - 40.0 mg/dL   LDL Cholesterol 186 (H) 0 - 99 mg/dL   Total CHOL/HDL Ratio 5    NonHDL 201.89   Comprehensive metabolic panel    Result Value Ref Range   Sodium 140 135 - 145 mEq/L   Potassium 3.6 3.5 - 5.1 mEq/L   Chloride 105 96 - 112 mEq/L   CO2 28 19 - 32 mEq/L   Glucose, Bld 106 (H) 70 - 99 mg/dL   BUN 18 6 - 23 mg/dL   Creatinine, Ser 1.16 0.40 - 1.20 mg/dL   Total Bilirubin 0.5 0.2 - 1.2 mg/dL   Alkaline Phosphatase 75 39 - 117 U/L   AST 17 0 - 37 U/L   ALT 20 0 - 35 U/L   Total Protein 8.2 6.0 - 8.3 g/dL   Albumin 4.1 3.5 - 5.2 g/dL   GFR 57.15 (L) >60.00 mL/min   Calcium 9.5 8.4 - 10.5 mg/dL  VITAMIN D 25 Hydroxy (Vit-D Deficiency, Fractures)  Result Value Ref Range   VITD 40.64 30.00 - 100.00 ng/mL   Assessment & Plan:  This visit occurred during the SARS-CoV-2 public health emergency.  Safety protocols were in place, including screening questions prior to the visit, additional usage of staff PPE, and extensive cleaning of exam room while observing appropriate contact time as indicated for disinfecting solutions.   Problem List Items Addressed This Visit    Vitamin D deficiency    Levels stable on vit D 1000 IU daily.       Severe obesity (BMI 35.0-39.9) with comorbidity ([place])    Reviewed noted weight gain, encouraged renewed efforts at healthy diet and lifestyle changes to affect sustainable weight loss.  Renal insufficiency    Mild insufficiency noted - encouraged increased water intake.       HLD (hyperlipidemia)    Chronic, not on meds. Reviewed risks/benefits of starting statin to lower cardiovascular risk. Agrees to restart atorvastatin 20mg . Reassess at 6 mo f/u visit.  The 10-year ASCVD risk score [name]., et al., 2013) is: 8.9%   Values used to calculate the score:     Age: 30 years     Sex: Female     Is Non-Hispanic African American: Yes     Diabetic: No     Tobacco smoker: No     Systolic Blood Pressure: 123XX123 mmHg     Is BP treated: Yes     HDL Cholesterol: 45.5 mg/dL     Total Cholesterol: 247 mg/dL       Relevant Medications   amLODipine (NORVASC) 5 MG  tablet   atorvastatin (LIPITOR) 20 MG tablet   Health maintenance examination - Primary    Preventative protocols reviewed and updated unless pt declined. Discussed healthy diet and lifestyle.  Encouraged she call to schedule well woman exam with [facility].  Provided info on how to sign up for covid vaccines.       Gastroesophageal reflux disease with esophagitis    EGD with reflux esophagitis - continue omeprazole 40mg  daily.      Essential hypertension    Chronic, stable. Continue current regimen of amlodipine 5mg  daily.       Relevant Medications   amLODipine (NORVASC) 5 MG tablet   atorvastatin (LIPITOR) 20 MG tablet       Meds ordered this encounter  Medications  . omeprazole (PRILOSEC) 40 MG capsule    Sig: TAKE 1 CAPSULE(40 MG) BY MOUTH DAILY    Dispense:  90 capsule    Refill:  3  . amLODipine (NORVASC) 5 MG tablet    Sig: Take 1 tablet (5 mg total) by mouth daily.    Dispense:  90 tablet    Refill:  3  . atorvastatin (LIPITOR) 20 MG tablet    Sig: Take 1 tablet (20 mg total) by mouth daily.    Dispense:  90 tablet    Refill:  3   No orders of the defined types were placed in this encounter.   Patient instructions: Schedule well woman exam at [facility] at your convenience.  [email or web address] or check at [facility] for covid shot.  Start atorvastatin 20mg  daily - take at night time. If trouble with muscle aches, decrease to every other day.  Return as needed or in 6 months for cholesterol recheck.   Follow up plan: Return in about 6 months (around 01/29/2020) for follow up visit.  Ria Bush, MD

## 2019-07-31 ENCOUNTER — Encounter: Payer: Self-pay | Admitting: Family Medicine

## 2019-07-31 NOTE — Assessment & Plan Note (Signed)
EGD with reflux esophagitis - continue omeprazole 40mg  daily.

## 2019-07-31 NOTE — Assessment & Plan Note (Addendum)
Chronic, stable. Continue current regimen of amlodipine 5mg  daily.

## 2019-07-31 NOTE — Assessment & Plan Note (Signed)
Levels stable on vit D 1000 IU daily.  ?

## 2019-07-31 NOTE — Assessment & Plan Note (Signed)
Reviewed noted weight gain, encouraged renewed efforts at healthy diet and lifestyle changes to affect sustainable weight loss.

## 2019-07-31 NOTE — Assessment & Plan Note (Signed)
Mild insufficiency noted - encouraged increased water intake.

## 2019-08-04 ENCOUNTER — Other Ambulatory Visit: Payer: Self-pay | Admitting: Family Medicine

## 2019-08-04 NOTE — Telephone Encounter (Signed)
ERx 

## 2019-08-04 NOTE — Telephone Encounter (Signed)
Last refilled on 05/09/2019 #100 g with 1 refill  LOV CPE on 07/29/19

## 2019-10-01 ENCOUNTER — Other Ambulatory Visit: Payer: Self-pay | Admitting: Family Medicine

## 2019-12-03 ENCOUNTER — Other Ambulatory Visit: Payer: Self-pay | Admitting: Family Medicine

## 2019-12-05 NOTE — Telephone Encounter (Signed)
Rx was last refilled 10/01/19 for #30 with 1 refill.  Patient was last seen for CPE on 07/29/19. OK to refill?

## 2020-01-27 ENCOUNTER — Other Ambulatory Visit: Payer: Self-pay

## 2020-01-27 ENCOUNTER — Encounter: Payer: Self-pay | Admitting: Family Medicine

## 2020-01-27 ENCOUNTER — Ambulatory Visit: Payer: BC Managed Care – PPO | Admitting: Family Medicine

## 2020-01-27 DIAGNOSIS — M545 Low back pain, unspecified: Secondary | ICD-10-CM

## 2020-01-27 MED ORDER — PREDNISONE 20 MG PO TABS: ORAL_TABLET | ORAL | 0 refills | Status: DC

## 2020-01-27 NOTE — Patient Instructions (Addendum)
I think you have hip bursitis as well as sciatica or irritation of leg nerve. Treat both with prednisone taper sent to pharmacy.  Watch carbs and sugar while on prednisone  May continue tylenol as needed.  Gentle stretching. Do exercises for hip provided today.  Let us know if not improving with this.

## 2020-01-27 NOTE — Assessment & Plan Note (Signed)
No red flags. Exam consistent with both R trochanteric bursitis as well as R lumbar radiculopathy. Rx prednisone course. Handout provided on stretching exercises for hip bursitis. Discussed further measures including heating pad, tylenol. Update if not improving with treatment.

## 2020-01-27 NOTE — Progress Notes (Signed)
This visit was conducted in person.  BP 124/82 (BP Location: Left Arm, Patient Position: Sitting, Cuff Size: Large)   Pulse 83   Temp 97.9 F (36.6 C) (Temporal)   Ht 5\' 5"  (1.651 m)   Wt 209 lb 6 oz (95 kg)   LMP 11/20/2015   SpO2 98%   BMI 34.84 kg/m    CC: back and hip pain  Subjective:    Patient ID: Martha Wade, female    DOB: 03/20/1956, 64 y.o.   MRN: [id number]  HPI: Martha Wade is a 64 y.o. female presenting on 01/27/2020 for Back Pain (C/o right low back pain, radiating through hip down into leg.  Hard to put wt on leg in the mornings.  Started earlier this week.  )   Several days of R lower back pain with radiation down lateral hip to posterior knee - notes difficulty to bear weight on that leg in the mornings after she gets up - this improves as day progresses. Feels some swelling to R leg.   No numbness or weakness of leg. No bowel/bladder accidents. No saddle anesthesia. No fevers/chills. Denies inciting trauma/injury or falls.   Using heating pad, elevating legs to sleep.  Treating with tylenol and advil. Also tried muscle relaxant without benefit.      Relevant past medical, surgical, family and social history reviewed and updated as indicated. Interim medical history since our last visit reviewed. Allergies and medications reviewed and updated. Outpatient Medications Prior to Visit  Medication Sig Dispense Refill  . acetaminophen (TYLENOL) 500 MG tablet Take 1,000 mg by mouth daily.    Marland Kitchen amLODipine (NORVASC) 5 MG tablet Take 1 tablet (5 mg total) by mouth daily. 90 tablet 3  . atorvastatin (LIPITOR) 20 MG tablet Take 1 tablet (20 mg total) by mouth daily. 90 tablet 3  . brimonidine (ALPHAGAN) 0.2 % ophthalmic solution     . Cholecalciferol (VITAMIN D-3) 1000 UNITS CAPS Take 1,000 Units by mouth daily.    . Coenzyme Q10 (COQ-10) 100 MG CAPS Take 100 mg by mouth daily.    . cyanocobalamin 500 MCG tablet Take 500 mcg by mouth daily.     . diclofenac  (VOLTAREN) 75 MG EC tablet TAKE 1 TABLET(75 MG) BY MOUTH DAILY FOR 1 WEEK THEN AS NEEDED 30 tablet 1  . diclofenac Sodium (VOLTAREN) 1 % GEL Apply 2 g topically 3 (three) times daily. 100 g 1  . dorzolamide (TRUSOPT) 2 % ophthalmic solution PLACE 1 DROP INTO BOTH EYES TWICE A DAY  5  . latanoprost (XALATAN) 0.005 % ophthalmic solution   4  . Omega-3 Fatty Acids (FISH OIL) 1000 MG CAPS Take 1 capsule by mouth daily.    Marland Kitchen omeprazole (PRILOSEC) 40 MG capsule TAKE 1 CAPSULE(40 MG) BY MOUTH DAILY 90 capsule 3  . ondansetron (ZOFRAN) 4 MG tablet Take 1 tablet (4 mg total) by mouth every 8 (eight) hours as needed for nausea or vomiting. 20 tablet 0  . terbinafine (LAMISIL) 250 MG tablet Take 1 tablet (250 mg total) by mouth daily. 45 tablet 0  . timolol (TIMOPTIC) 0.5 % ophthalmic solution   4  . methocarbamol (ROBAXIN) 500 MG tablet Take 1 tablet (500 mg total) by mouth 2 (two) times daily as needed for muscle spasms. (Patient not taking: Reported on 01/27/2020) 30 tablet 3   No facility-administered medications prior to visit.     Per HPI unless specifically indicated in ROS section below Review of Systems Objective:  BP  124/82 (BP Location: Left Arm, Patient Position: Sitting, Cuff Size: Large)   Pulse 83   Temp 97.9 F (36.6 C) (Temporal)   Ht 5\' 5"  (1.651 m)   Wt 209 lb 6 oz (95 kg)   LMP 11/20/2015   SpO2 98%   BMI 34.84 kg/m   Wt Readings from Last 3 Encounters:  01/27/20 209 lb 6 oz (95 kg)  07/29/19 216 lb 8 oz (98.2 kg)  05/09/19 216 lb 3 oz (98.1 kg)      Physical Exam Vitals and nursing note reviewed.  Constitutional:      Appearance: Normal appearance. She is obese.  Musculoskeletal:        General: No swelling. Normal range of motion.     Right lower leg: No edema.     Left lower leg: No edema.     Comments:  No pain midline spine No paraspinous mm tenderness Mild discomfort with SLR on right No pain with int/ext rotation at hip. Discomfort to palpation at R SIJ, R  GTB and R sciatic notch  Skin:    General: Skin is warm and dry.     Findings: No rash.  Neurological:     Mental Status: She is alert.     Sensory: Sensation is intact.     Motor: Motor function is intact.     Coordination: Coordination is intact.     Deep Tendon Reflexes:     Reflex Scores:      Patellar reflexes are 2+ on the right side and 2+ on the left side.    Comments:  5/5 strength BLE Able to heel and toe walk  Psychiatric:        Mood and Affect: Mood normal.        Behavior: Behavior normal.       Lab Results  Component Value Date   CREATININE 1.16 07/22/2019   BUN 18 07/22/2019   NA 140 07/22/2019   K 3.6 07/22/2019   CL 105 07/22/2019   CO2 28 07/22/2019   No results found for: HGBA1C Assessment & Plan:  This visit occurred during the SARS-CoV-2 public health emergency.  Safety protocols were in place, including screening questions prior to the visit, additional usage of staff PPE, and extensive cleaning of exam room while observing appropriate contact time as indicated for disinfecting solutions.   Problem List Items Addressed This Visit    Low back pain radiating to right lower extremity    No red flags. Exam consistent with both R trochanteric bursitis as well as R lumbar radiculopathy. Rx prednisone course. Handout provided on stretching exercises for hip bursitis. Discussed further measures including heating pad, tylenol. Update if not improving with treatment.       Relevant Medications   predniSONE (DELTASONE) 20 MG tablet       Meds ordered this encounter  Medications  . predniSONE (DELTASONE) 20 MG tablet    Sig: Take two tablets daily for 4 days followed by one tablet daily for 4 days    Dispense:  12 tablet    Refill:  0   No orders of the defined types were placed in this encounter.   Patient Instructions  I think you have hip bursitis as well as sciatica or irritation of leg nerve. Treat both with prednisone taper sent to pharmacy.    Watch carbs and sugar while on prednisone  May continue tylenol as needed.  Gentle stretching. Do exercises for hip provided today.  Let us  know if not improving with this.    Follow up plan: Return if symptoms worsen or fail to improve.  Ria Bush, MD

## 2020-02-03 ENCOUNTER — Other Ambulatory Visit: Payer: Self-pay | Admitting: Family Medicine

## 2020-02-03 NOTE — Telephone Encounter (Signed)
Voltaren tab Last filled:  01/04/20, #30 Last OV:  01/27/20, low back pain Next OV:  10/1/021, right hip f/u

## 2020-02-10 ENCOUNTER — Ambulatory Visit (INDEPENDENT_AMBULATORY_CARE_PROVIDER_SITE_OTHER): Payer: BC Managed Care – PPO | Admitting: Family Medicine

## 2020-02-10 ENCOUNTER — Other Ambulatory Visit: Payer: Self-pay

## 2020-02-10 ENCOUNTER — Encounter: Payer: Self-pay | Admitting: Family Medicine

## 2020-02-10 VITALS — BP 120/76 | HR 76 | Temp 97.9°F | Ht 65.0 in | Wt 211.6 lb

## 2020-02-10 DIAGNOSIS — M7061 Trochanteric bursitis, right hip: Secondary | ICD-10-CM | POA: Diagnosis not present

## 2020-02-10 DIAGNOSIS — M545 Low back pain, unspecified: Secondary | ICD-10-CM

## 2020-02-10 DIAGNOSIS — M79604 Pain in right leg: Secondary | ICD-10-CM | POA: Diagnosis not present

## 2020-02-10 MED ORDER — PREDNISONE 20 MG PO TABS
ORAL_TABLET | ORAL | 0 refills | Status: AC
Start: 2020-02-10 — End: 2020-02-19

## 2020-02-10 MED ORDER — GABAPENTIN 300 MG PO CAPS
300.0000 mg | ORAL_CAPSULE | Freq: Every day | ORAL | 1 refills | Status: DC
Start: 2020-02-10 — End: 2020-04-08

## 2020-02-10 MED ORDER — METHOCARBAMOL 500 MG PO TABS: 500.0000 mg | ORAL_TABLET | Freq: Two times a day (BID) | ORAL | 3 refills | Status: DC | PRN

## 2020-02-10 NOTE — Assessment & Plan Note (Signed)
Predominant issue today. May benefit from steroid injection. I asked she schedule appt with Dr Lorelei Pont for further evaluation. In interim, will prescribe another prednisone taper and refill methocarbamol. Avoid NSAIDs given allergy to aleve in the past.

## 2020-02-10 NOTE — Patient Instructions (Signed)
I think predominant issue today is the hip bursitis. You may benefit from steroid injection into bursa- schedule appointment with Dr Lorelei Pont our sports medicine doctor for further evaluation.  In the meantime, continue exercises previously provided.  May take another prednisone course sent to pharmacy. Caution with sugar and carbs while on steroids.  Robaxin muscle relaxant filled. Gabapentin also sent to pharmacy to take at night time to help sciatica.

## 2020-02-10 NOTE — Progress Notes (Signed)
This visit was conducted in person.  BP 120/76 (BP Location: Left Arm, Patient Position: Sitting, Cuff Size: Large)   Pulse 76   Temp 97.9 F (36.6 C) (Temporal)   Ht 5\' 5"  (1.651 m)   Wt 211 lb 9 oz (96 kg)   LMP 11/20/2015   SpO2 98%   BMI 35.21 kg/m    CC: ongoing R hip pain  Subjective:    Patient ID: Martha Wade, female    DOB: [birth year], 64 y.o.   MRN: [id number]  HPI: Martha Wade is a 64 y.o. female presenting on 02/10/2020 for Hip Pain (C/o still having right hip pain.  Pain has improved but worse in AM when getting up.  Pt accompanied by husband, [name]- temp 97.6.)   Seen here 2 wks ago with several days of R lower back pain with radiation down lateral hip to knee. Thought R bursitis and R lumbar radiculopathy. Treated with prednisone course. Symptoms have improved but persist - worse when she gets up in the mornings and puts weight on leg.   Points to R buttock with radiation to lateral hip into R lateral knee. No groin pain. She notes burning to R lateral leg. No weakness/numbness. Also using tylenol and robaxin MR.      Relevant past medical, surgical, family and social history reviewed and updated as indicated. Interim medical history since our last visit reviewed. Allergies and medications reviewed and updated. Outpatient Medications Prior to Visit  Medication Sig Dispense Refill  . acetaminophen (TYLENOL) 500 MG tablet Take 1,000 mg by mouth daily.    Marland Kitchen amLODipine (NORVASC) 5 MG tablet Take 1 tablet (5 mg total) by mouth daily. 90 tablet 3  . atorvastatin (LIPITOR) 20 MG tablet Take 1 tablet (20 mg total) by mouth daily. 90 tablet 3  . brimonidine (ALPHAGAN) 0.2 % ophthalmic solution     . Cholecalciferol (VITAMIN D-3) 1000 UNITS CAPS Take 1,000 Units by mouth daily.    . Coenzyme Q10 (COQ-10) 100 MG CAPS Take 100 mg by mouth daily.    . cyanocobalamin 500 MCG tablet Take 500 mcg by mouth daily.     . diclofenac (VOLTAREN) 75 MG EC tablet TAKE 1 TABLET(75  MG) BY MOUTH DAILY FOR 1 WEEK THEN AS NEEDED 30 tablet 1  . diclofenac Sodium (VOLTAREN) 1 % GEL Apply 2 g topically 3 (three) times daily. 100 g 1  . dorzolamide (TRUSOPT) 2 % ophthalmic solution PLACE 1 DROP INTO BOTH EYES TWICE A DAY  5  . latanoprost (XALATAN) 0.005 % ophthalmic solution   4  . Omega-3 Fatty Acids (FISH OIL) 1000 MG CAPS Take 1 capsule by mouth daily.    Marland Kitchen omeprazole (PRILOSEC) 40 MG capsule TAKE 1 CAPSULE(40 MG) BY MOUTH DAILY 90 capsule 3  . ondansetron (ZOFRAN) 4 MG tablet Take 1 tablet (4 mg total) by mouth every 8 (eight) hours as needed for nausea or vomiting. 20 tablet 0  . terbinafine (LAMISIL) 250 MG tablet Take 1 tablet (250 mg total) by mouth daily. 45 tablet 0  . timolol (TIMOPTIC) 0.5 % ophthalmic solution   4  . methocarbamol (ROBAXIN) 500 MG tablet Take 1 tablet (500 mg total) by mouth 2 (two) times daily as needed for muscle spasms. 30 tablet 3  . predniSONE (DELTASONE) 20 MG tablet Take two tablets daily for 4 days followed by one tablet daily for 4 days 12 tablet 0   No facility-administered medications prior to visit.     Per  HPI unless specifically indicated in ROS section below Review of Systems Objective:  BP 120/76 (BP Location: Left Arm, Patient Position: Sitting, Cuff Size: Large)   Pulse 76   Temp 97.9 F (36.6 C) (Temporal)   Ht 5\' 5"  (1.651 m)   Wt 211 lb 9 oz (96 kg)   LMP 11/20/2015   SpO2 98%   BMI 35.21 kg/m   Wt Readings from Last 3 Encounters:  02/10/20 211 lb 9 oz (96 kg)  01/27/20 209 lb 6 oz (95 kg)  07/29/19 216 lb 8 oz (98.2 kg)      Physical Exam Vitals and nursing note reviewed.  Constitutional:      Appearance: Normal appearance. She is not ill-appearing.  Musculoskeletal:        General: Tenderness present. No swelling. Normal range of motion.     Right lower leg: No edema.     Left lower leg: No edema.     Comments:  Crepitus at bilateral knees No pain midline spine No paraspinous mm tenderness Discomfort  with SLR on right  No pain with int/ext rotation at hip. No pain at SIJ or sciatic notch bilaterally. ++ GTB pain on right  Skin:    General: Skin is warm and dry.  Neurological:     Mental Status: She is alert.  Psychiatric:        Mood and Affect: Mood normal.        Behavior: Behavior normal.        Assessment & Plan:  This visit occurred during the SARS-CoV-2 public health emergency.  Safety protocols were in place, including screening questions prior to the visit, additional usage of staff PPE, and extensive cleaning of exam room while observing appropriate contact time as indicated for disinfecting solutions.   Problem List Items Addressed This Visit    Trochanteric bursitis of right hip - Primary    Predominant issue today. May benefit from steroid injection. I asked she schedule appt with Dr Lorelei Pont for further evaluation. In interim, will prescribe another prednisone taper and refill methocarbamol. Avoid NSAIDs given allergy to aleve in the past.       Low back pain radiating to right lower extremity    Ongoing. Prednisone did help. Will Rx gabapentin 300mg  at night time (husband's med has helped her rest).       Relevant Medications   methocarbamol (ROBAXIN) 500 MG tablet   predniSONE (DELTASONE) 20 MG tablet       Meds ordered this encounter  Medications  . methocarbamol (ROBAXIN) 500 MG tablet    Sig: Take 1 tablet (500 mg total) by mouth 2 (two) times daily as needed for muscle spasms.    Dispense:  30 tablet    Refill:  3  . predniSONE (DELTASONE) 20 MG tablet    Sig: Take 3 tablets (60 mg total) by mouth daily with breakfast for 3 days, THEN 2 tablets (40 mg total) daily with breakfast for 3 days, THEN 1 tablet (20 mg total) daily with breakfast for 3 days.    Dispense:  18 tablet    Refill:  0  . gabapentin (NEURONTIN) 300 MG capsule    Sig: Take 1 capsule (300 mg total) by mouth at bedtime.    Dispense:  30 capsule    Refill:  1   No orders of the  defined types were placed in this encounter.   Patient Instructions  I think predominant issue today is the hip bursitis. You may benefit  from steroid injection into bursa- schedule appointment with Dr Lorelei Pont our sports medicine doctor for further evaluation.  In the meantime, continue exercises previously provided.  May take another prednisone course sent to pharmacy. Caution with sugar and carbs while on steroids.  Robaxin muscle relaxant filled. Gabapentin also sent to pharmacy to take at night time to help sciatica.    Follow up plan: No follow-ups on file.  Ria Bush, MD

## 2020-02-10 NOTE — Assessment & Plan Note (Signed)
Ongoing. Prednisone did help. Will Rx gabapentin 300mg  at night time (husband's med has helped her rest).

## 2020-02-16 ENCOUNTER — Ambulatory Visit: Payer: BC Managed Care – PPO | Admitting: Family Medicine

## 2020-02-21 NOTE — Progress Notes (Signed)
Martha Wade T. Benoit Meech, MD, [facility]  Primary Care and Sports Medicine [facility] at [facility] [place], [address]  Phone: [phone]   FAX: [phone]  Martha Wade - 64 y.o. female   MRN [id number]   Date of Birth: [birth year]  Date: 02/22/2020   PCP: Ria Bush, MD   Referral: Ria Bush, MD  Chief Complaint  Patient presents with   Hip Pain    Right    This visit occurred during the SARS-CoV-2 public health emergency.  Safety protocols were in place, including screening questions prior to the visit, additional usage of staff PPE, and extensive cleaning of exam room while observing appropriate contact time as indicated for disinfecting solutions.   Subjective:   Martha Wade is a 64 y.o. very pleasant female patient with Body mass index is 35.11 kg/m. who presents with the following:  She is here today in consultation from my partner Dr. Danise Mina.  She has been having some persistent hip pain for approximately 1 month.  She has been through 2 courses of oral steroids, and she has had some pain in the posterior buttocks to the lateral hip pain as well as some pain that is radiating down to the right knee.  She is here for further evaluation:  In the morning had a lot of difficulty putting some weight on it. Then able to walk.  She primarily points to her lateral hip, and she denies groin pain.  No pain with rotational movements such as crossing her legs or putting on pants.   She does have some lateral pain going to the knee with a burning sensation at times, but she does not have any posterior radicular symptoms.  Review of Systems is noted in the HPI, as appropriate   Objective:   BP 110/70    Pulse 70    Temp (!) 97.3 F (36.3 C) (Temporal)    Ht 5\' 5"  (1.651 m)    Wt 211 lb (95.7 kg)    LMP 11/20/2015    SpO2 97%    BMI 35.11 kg/m    GEN: No acute distress; alert,appropriate. PULM: Breathing  comfortably in no respiratory distress PSYCH: Normally interactive.   HIP EXAM: SIDE: R ROM: Abduction, Flexion, Internal and External range of motion: full Pain with terminal IROM and EROM: none GTB: ++ ttp SLR: NEG Knees: No effusion FABER: NT REVERSE FABER: NT, neg Piriformis: NT at direct palpation Str: flexion: 4/5 abduction: 4+/5 adduction: 4/5 Strength testing non-tender   Radiology: No results found.  Assessment and Plan:     ICD-10-CM   1. Trochanteric bursitis of right hip  M70.61   2. Acute right hip pain  M25.551   3. Acute back pain, unspecified back location, unspecified back pain laterality  M54.9    Classic GTB. Referred pain to thigh, buttocks most likely from l-spine, ibt is playing a role, too.  I reviewed with the patient the structures involved and how they related to diagnosis.   Patient was given a rehabilitation protocol emphasizing core rehab, partcularly addressing abductor weakness, and stretching of the pelvic musculature, hips, and ITB.  Trochanteric bursa injections have clearly been shown to help with acute bursitis, and the patient would benefit.   Aspiration/Injection Procedure Note [name] Nov 04, 1955 Date of procedure: 02/22/2020  Procedure: Large Joint Aspiration / Injection of Hip, Trochanteric Bursa Indications: Pain  Procedure Details Verbal consent obtained. Risks (including infection, potential atrophy), benefits, and alternatives reviewed.  Greater trochanter sterilely prepped with Chloraprep. Ethyl Chloride used for anesthesia. 8 cc of Lidocaine 1% injected with 2 mL of Depo-Medrol 40 mg into trochanteric bursa at area of maximal tenderness at greater trochanter. Needle taken to bone to troch bursa, flows easily. Bursa massaged. No bleeding and no complications. Decreased pain after injection. Needle: 22 gauge spinal needle Medication: 2 mL of Depo-Medrol 40 mg, equaling Depo-Medrol 80 mg total  Patient Instructions  Hip  Rehab:  Hip Flexion: Toe up to ceiling, laying on your back. Lift your whole leg, 3 sets. Work up to being able to do #30 with each set.  Hip elevations, Toe and leg turned out to side.  Lift whole leg, 3 sets. Work up to being able to do #30 with each set.  Hip Abductions: Lying on side, straight out to side. 3 sets, work up to being able to do #30 with each set.  At the beginning you may only be able to do a lot less, try to do #10.    No orders of the defined types were placed in this encounter.  There are no discontinued medications. No orders of the defined types were placed in this encounter.   Follow-up: No follow-ups on file.  Signed,  Maud Deed. Pal Shell, MD   Outpatient Encounter Medications as of 02/22/2020  Medication Sig   acetaminophen (TYLENOL) 500 MG tablet Take 1,000 mg by mouth daily.   amLODipine (NORVASC) 5 MG tablet Take 1 tablet (5 mg total) by mouth daily.   atorvastatin (LIPITOR) 20 MG tablet Take 1 tablet (20 mg total) by mouth daily.   brimonidine (ALPHAGAN) 0.2 % ophthalmic solution    Cholecalciferol (VITAMIN D-3) 1000 UNITS CAPS Take 1,000 Units by mouth daily.   Coenzyme Q10 (COQ-10) 100 MG CAPS Take 100 mg by mouth daily.   cyanocobalamin 500 MCG tablet Take 500 mcg by mouth daily.    diclofenac (VOLTAREN) 75 MG EC tablet TAKE 1 TABLET(75 MG) BY MOUTH DAILY FOR 1 WEEK THEN AS NEEDED   diclofenac Sodium (VOLTAREN) 1 % GEL Apply 2 g topically 3 (three) times daily.   dorzolamide (TRUSOPT) 2 % ophthalmic solution PLACE 1 DROP INTO BOTH EYES TWICE A DAY   gabapentin (NEURONTIN) 300 MG capsule Take 1 capsule (300 mg total) by mouth at bedtime.   latanoprost (XALATAN) 0.005 % ophthalmic solution    methocarbamol (ROBAXIN) 500 MG tablet Take 1 tablet (500 mg total) by mouth 2 (two) times daily as needed for muscle spasms.   Omega-3 Fatty Acids (FISH OIL) 1000 MG CAPS Take 1 capsule by mouth daily.   omeprazole (PRILOSEC) 40 MG capsule TAKE  1 CAPSULE(40 MG) BY MOUTH DAILY   ondansetron (ZOFRAN) 4 MG tablet Take 1 tablet (4 mg total) by mouth every 8 (eight) hours as needed for nausea or vomiting.   terbinafine (LAMISIL) 250 MG tablet Take 1 tablet (250 mg total) by mouth daily.   timolol (TIMOPTIC) 0.5 % ophthalmic solution    No facility-administered encounter medications on file as of 02/22/2020.

## 2020-02-22 ENCOUNTER — Encounter: Payer: Self-pay | Admitting: Family Medicine

## 2020-02-22 ENCOUNTER — Other Ambulatory Visit: Payer: Self-pay

## 2020-02-22 ENCOUNTER — Ambulatory Visit: Payer: BC Managed Care – PPO | Admitting: Family Medicine

## 2020-02-22 VITALS — BP 110/70 | HR 70 | Temp 97.3°F | Ht 65.0 in | Wt 211.0 lb

## 2020-02-22 DIAGNOSIS — M25551 Pain in right hip: Secondary | ICD-10-CM

## 2020-02-22 DIAGNOSIS — M549 Dorsalgia, unspecified: Secondary | ICD-10-CM

## 2020-02-22 DIAGNOSIS — M7061 Trochanteric bursitis, right hip: Secondary | ICD-10-CM | POA: Diagnosis not present

## 2020-02-22 MED ORDER — METHYLPREDNISOLONE ACETATE 40 MG/ML IJ SUSP
80.0000 mg | Freq: Once | INTRAMUSCULAR | Status: AC
  Administered 2020-02-22: 80 mg via INTRA_ARTICULAR

## 2020-02-22 NOTE — Addendum Note (Signed)
Addended by: Carter Kitten on: 02/22/2020 08:56 AM   Modules accepted: Orders

## 2020-02-22 NOTE — Patient Instructions (Signed)
Hip Rehab:  Hip Flexion: Toe up to ceiling, laying on your back. Lift your whole leg, 3 sets. Work up to being able to do #30 with each set.  Hip elevations, Toe and leg turned out to side.  Lift whole leg, 3 sets. Work up to being able to do #30 with each set.  Hip Abductions: Lying on side, straight out to side. 3 sets, work up to being able to do #30 with each set.  At the beginning you may only be able to do a lot less, try to do #10.  

## 2020-03-21 ENCOUNTER — Other Ambulatory Visit: Payer: Self-pay

## 2020-03-21 ENCOUNTER — Encounter: Payer: Self-pay | Admitting: Family Medicine

## 2020-03-21 ENCOUNTER — Ambulatory Visit: Payer: BC Managed Care – PPO | Admitting: Family Medicine

## 2020-03-21 VITALS — BP 116/70 | HR 85 | Temp 97.6°F | Ht 65.0 in | Wt 205.6 lb

## 2020-03-21 DIAGNOSIS — E669 Obesity, unspecified: Secondary | ICD-10-CM | POA: Diagnosis not present

## 2020-03-21 DIAGNOSIS — K2100 Gastro-esophageal reflux disease with esophagitis, without bleeding: Secondary | ICD-10-CM

## 2020-03-21 MED ORDER — FAMOTIDINE 20 MG PO TABS: 20.0000 mg | ORAL_TABLET | Freq: Every day | ORAL | 6 refills | Status: DC

## 2020-03-21 MED ORDER — ESOMEPRAZOLE MAGNESIUM 40 MG PO CPDR: 40.0000 mg | DELAYED_RELEASE_CAPSULE | Freq: Every day | ORAL | 6 refills | Status: DC

## 2020-03-21 NOTE — Assessment & Plan Note (Signed)
Congratulated on weight loss to date - discussed how further weight loss would help control GERD symptoms.

## 2020-03-21 NOTE — Progress Notes (Signed)
This visit was conducted in person.  BP 116/70 (BP Location: Left Arm, Patient Position: Sitting, Cuff Size: Large)   Pulse 85   Temp 97.6 F (36.4 C) (Temporal)   Ht 5\' 5"  (1.651 m)   Wt 205 lb 9 oz (93.2 kg)   LMP 11/20/2015   SpO2 97%   BMI 34.21 kg/m    CC: GERD Subjective:    Patient ID: Martha Wade, female    DOB: [birth year], 64 y.o.   MRN: [id number]  HPI: Martha Wade is a 64 y.o. female presenting on 03/21/2020 for Gastroesophageal Reflux (C/o acid reflux flare up about 2 wks ago.   Better yesterday and today.  Taking omeprazole and trying to watch diet. )   Worsening GERD symptoms for the past 2 weeks - notes globus sensation to mid chest. Some indigestion and throat burning. Worse symptoms at night time.  No dysphagia. No abd pain, nausea/vomiting. No significant acid reflux. No bloating/gassiness.  No dyspnea, chest pain/pressure.  She is largely only drinking water, stopped all caffeine (drinking only small amt decaf).   This is despite omeprazole 40mg  daily - sometimes took BID.   COLONOSCOPY WITH ESOPHAGOGASTRODUODENOSCOPY (EGD) 07/2018 - colon - int hem, rpt 5 yrs, EGD - reflux esophagitis Vira Agar)  Recently saw Dr Lorelei Pont for R GTB treated with steroid injection. She had been taking diclofenac 75mg  bid for this - stopped about 2 wks ago.      Relevant past medical, surgical, family and social history reviewed and updated as indicated. Interim medical history since our last visit reviewed. Allergies and medications reviewed and updated. Outpatient Medications Prior to Visit  Medication Sig Dispense Refill  . acetaminophen (TYLENOL) 500 MG tablet Take 1,000 mg by mouth daily.    Marland Kitchen amLODipine (NORVASC) 5 MG tablet Take 1 tablet (5 mg total) by mouth daily. 90 tablet 3  . atorvastatin (LIPITOR) 20 MG tablet Take 1 tablet (20 mg total) by mouth daily. 90 tablet 3  . brimonidine (ALPHAGAN) 0.2 % ophthalmic solution     . Cholecalciferol (VITAMIN D-3) 1000  UNITS CAPS Take 1,000 Units by mouth daily.    . Coenzyme Q10 (COQ-10) 100 MG CAPS Take 100 mg by mouth daily.    . cyanocobalamin 500 MCG tablet Take 500 mcg by mouth daily.     . diclofenac Sodium (VOLTAREN) 1 % GEL Apply 2 g topically 3 (three) times daily. 100 g 1  . dorzolamide (TRUSOPT) 2 % ophthalmic solution PLACE 1 DROP INTO BOTH EYES TWICE A DAY  5  . gabapentin (NEURONTIN) 300 MG capsule Take 1 capsule (300 mg total) by mouth at bedtime. 30 capsule 1  . latanoprost (XALATAN) 0.005 % ophthalmic solution   4  . methocarbamol (ROBAXIN) 500 MG tablet Take 1 tablet (500 mg total) by mouth 2 (two) times daily as needed for muscle spasms. 30 tablet 3  . Omega-3 Fatty Acids (FISH OIL) 1000 MG CAPS Take 1 capsule by mouth daily.    . ondansetron (ZOFRAN) 4 MG tablet Take 1 tablet (4 mg total) by mouth every 8 (eight) hours as needed for nausea or vomiting. 20 tablet 0  . terbinafine (LAMISIL) 250 MG tablet Take 1 tablet (250 mg total) by mouth daily. 45 tablet 0  . timolol (TIMOPTIC) 0.5 % ophthalmic solution   4  . diclofenac (VOLTAREN) 75 MG EC tablet TAKE 1 TABLET(75 MG) BY MOUTH DAILY FOR 1 WEEK THEN AS NEEDED 30 tablet 1  . omeprazole (PRILOSEC) 40 MG  capsule TAKE 1 CAPSULE(40 MG) BY MOUTH DAILY 90 capsule 3   No facility-administered medications prior to visit.     Per HPI unless specifically indicated in ROS section below Review of Systems Objective:  BP 116/70 (BP Location: Left Arm, Patient Position: Sitting, Cuff Size: Large)   Pulse 85   Temp 97.6 F (36.4 C) (Temporal)   Ht 5\' 5"  (1.651 m)   Wt 205 lb 9 oz (93.2 kg)   LMP 11/20/2015   SpO2 97%   BMI 34.21 kg/m   Wt Readings from Last 3 Encounters:  03/21/20 205 lb 9 oz (93.2 kg)  02/22/20 211 lb (95.7 kg)  02/10/20 211 lb 9 oz (96 kg)      Physical Exam Vitals and nursing note reviewed.  Constitutional:      Appearance: Normal appearance. She is not ill-appearing.  Cardiovascular:     Rate and Rhythm: Normal  rate and regular rhythm.     Pulses: Normal pulses.     Heart sounds: Normal heart sounds. No murmur heard.   Pulmonary:     Effort: Pulmonary effort is normal. No respiratory distress.     Breath sounds: Normal breath sounds. No wheezing, rhonchi or rales.  Abdominal:     General: Bowel sounds are normal. There is no distension.     Palpations: Abdomen is soft. There is no mass.     Tenderness: There is no abdominal tenderness. There is no right CVA tenderness, left CVA tenderness, guarding or rebound. Negative signs include Murphy's sign.     Hernia: No hernia is present.  Musculoskeletal:     Right lower leg: No edema.     Left lower leg: No edema.  Skin:    General: Skin is warm and dry.     Findings: No rash.  Neurological:     Mental Status: She is alert.  Psychiatric:        Mood and Affect: Mood normal.        Behavior: Behavior normal.        Assessment & Plan:  This visit occurred during the SARS-CoV-2 public health emergency.  Safety protocols were in place, including screening questions prior to the visit, additional usage of staff PPE, and extensive cleaning of exam room while observing appropriate contact time as indicated for disinfecting solutions.   Problem List Items Addressed This Visit    Obesity, Class I, BMI 30-34.9    Congratulated on weight loss to date - discussed how further weight loss would help control GERD symptoms.       Gastroesophageal reflux disease with esophagitis - Primary    Describes GERD without complicating features. Reviewed EGD from 2020 showing reflux esophagitis.  Omeprazole 40mg  daily not effective to control symptoms.  Will change to esomeprazole 40mg  daily and add pepcid 20mg  at night time.  Agree with stopping diclofenac.  Reviewed diet choices to improve GERD symptoms.  Update if not improving with treatment.           Meds ordered this encounter  Medications  . esomeprazole (NEXIUM) 40 MG capsule    Sig: Take 1  capsule (40 mg total) by mouth daily.    Dispense:  30 capsule    Refill:  6    To replace omeprazole  . famotidine (PEPCID) 20 MG tablet    Sig: Take 1 tablet (20 mg total) by mouth at bedtime.    Dispense:  30 tablet    Refill:  6   No orders  of the defined types were placed in this encounter.   Patient instructions: Sounds like you have worsening heartburn/GERD or esophagitis like last year.  Change omeprazole to esomeprazole (nexium) 40mg  daily, add pepcid OTC 20mg  at night time.   Other things to do for reflux: Head of bed elevated. Avoidance of citrus, fatty foods, chocolate, peppermint, and excessive alcohol, along with sodas, orange juice (acidic drinks) At least a few hours between dinner and bed, minimize naps after eating.  No smoke exposure.   Let us know if not improving with above.  Follow up plan: Return if symptoms worsen or fail to improve.  Ria Bush, MD

## 2020-03-21 NOTE — Assessment & Plan Note (Addendum)
Describes GERD without complicating features. Reviewed EGD from 2020 showing reflux esophagitis.  Omeprazole 40mg  daily not effective to control symptoms.  Will change to esomeprazole 40mg  daily and add pepcid 20mg  at night time.  Agree with stopping diclofenac.  Reviewed diet choices to improve GERD symptoms.  Update if not improving with treatment.

## 2020-03-21 NOTE — Patient Instructions (Addendum)
Sounds like you have worsening heartburn/GERD or esophagitis like last year.  Change omeprazole to esomeprazole (nexium) 40mg  daily, add pepcid OTC 20mg  at night time.   Other things to do for reflux: Head of bed elevated. Avoidance of citrus, fatty foods, chocolate, peppermint, and excessive alcohol, along with sodas, orange juice (acidic drinks) At least a few hours between dinner and bed, minimize naps after eating.  No smoke exposure.   Let us know if not improving with above.  Esophagitis  Esophagitis is inflammation of the esophagus. The esophagus is the tube that carries food from your mouth to your stomach. Esophagitis can cause soreness or pain in the esophagus. This condition can make it difficult and painful to swallow. What are the causes? Most causes of esophagitis are not serious. Common causes of this condition include:  Gastroesophageal reflux disease (GERD). This is when stomach contents move back up into the esophagus (reflux).  Repeated vomiting.  An allergic reaction, especially caused by food allergies (eosinophilic esophagitis).  Injury to the esophagus by swallowing large pills with or without water, or swallowing certain types of medicines.  Swallowing (ingesting) harmful chemicals, such as household cleaning products.  Heavy alcohol use.  An infection of the esophagus. This most often occurs in people who have a weakened immune system.  Radiation or chemotherapy treatment for cancer.  Certain diseases such as sarcoidosis, Crohn's disease, and scleroderma. What are the signs or symptoms? Symptoms of this condition include:  Difficult or painful swallowing.  Pain with swallowing acidic liquids, such as citrus juices.  Pain with burping.  Chest pain.  Difficulty breathing.  Nausea.  Vomiting.  Pain in the abdomen.  Weight loss.  Ulcers in the mouth.  Patches of white material in the mouth (candidiasis).  Fever.  Coughing up blood or  vomiting blood.  Stool that is black, tarry, or bright red. How is this diagnosed? Your health care provider will take a medical history and perform a physical exam. You may also have other tests, including:  An endoscopy to examine your esophagus and stomach with a small flexible tube with a camera.  A test that measures the acidity level in your esophagus.  A test that measures how much pressure is on your esophagus.  A barium swallow or modified barium swallow to show the shape, size, and functioning of your esophagus.  Allergy tests. How is this treated? Treatment for this condition depends on the cause of your esophagitis. In some cases, steroids or other medicines may be given to help relieve your symptoms or to treat the underlying cause of your condition. You may have to make some lifestyle changes, such as:  Avoiding alcohol.  Quitting smoking.  Changing your diet.  Exercising.  Changing your sleep habits and your sleep environment. Follow these instructions at home: Medicines  Take over-the-counter and prescription medicines only as told by your health care provider.  Do not take aspirin, ibuprofen, or other NSAIDs unless your health care provider told you to do so.  If you have trouble taking pills: ? Use a pill splitter to decrease the size of the pill. This will decrease the chance of the pill getting stuck or injuring your esophagus. ? Drink water after you take a pill. Eating and drinking   Avoid foods and drinks that seem to make your symptoms worse.  Follow a diet as recommended by your health care provider. This may involve avoiding foods and drinks such as: ? Coffee and tea (with or without  caffeine). ? Drinks that contain alcohol. ? Energy drinks and sports drinks. ? Carbonated drinks or sodas. ? Chocolate and cocoa. ? Peppermint and mint flavorings. ? Garlic and onions. ? Horseradish. ? Spicy and acidic foods, including peppers, chili powder,  curry powder, vinegar, hot sauces, and barbecue sauce. ? Citrus fruit juices and citrus fruits, such as oranges, lemons, and limes. ? Tomato-based foods, such as red sauce, chili, salsa, and pizza with red sauce. ? Fried and fatty foods, such as donuts, french fries, potato chips, and high-fat dressings. ? High-fat meats, such as hot dogs and fatty cuts of red and white meats, such as rib eye steak, sausage, ham, and bacon. ? High-fat dairy items, such as whole milk, butter, and cream cheese. Lifestyle  Eat small, frequent meals instead of large meals.  Avoid drinking large amounts of liquid with your meals.  Avoid eating meals during the 2-3 hours before bedtime.  Avoid lying down right after you eat.  Do not exercise right after you eat.  Do not use any products that contain nicotine or tobacco, such as cigarettes and e-cigarettes. If you need help quitting, ask your health care provider. General instructions   Pay attention to any changes in your symptoms. Let your health care provider know about them.  Wear loose-fitting clothing. Do not wear anything tight around your waist that causes pressure on your abdomen.  Raise (elevate) the head of your bed about 6 inches (15 cm).  Try relaxation strategies such as yoga, deep breathing, or meditation to manage stress. If you need help reducing stress, ask your health care provider.  If you are overweight, reduce your weight to an amount that is healthy for you. Ask your health care provider for guidance about a safe weight loss goal.  Keep all follow-up visits as told by your health care provider. This is important. Contact a health care provider if:  You have new symptoms.  You have unexplained weight loss.  You have difficulty swallowing, or it hurts to swallow.  You have wheezing or a cough that does not go away.  Your symptoms do not improve with treatment.  You have frequent heartburn for more than two weeks. Get help  right away if:  You have severe pain in your arms, neck, jaw, teeth, or back.  You feel sweaty, dizzy, or light-headed.  You have chest pain or shortness of breath.  You vomit and your vomit looks like blood or coffee grounds.  Your stool is bloody or black.  You have a fever.  You cannot swallow, drink, or eat. Summary  Esophagitis is inflammation of the esophagus.  Most causes of esophagitis are not serious.  Follow your health care provider's instructions about eating and drinking. Follow instructions on medicines.  Contact a health care provider if you have new symptoms, have weight loss, or coughing that does not stop.  Get help right away if you have severe pain in the arms, neck, jaw, teeth or back, or if you have chest pain, shortness of breath, or fever. This information is not intended to replace advice given to you by your health care provider. Make sure you discuss any questions you have with your health care provider. Document Revised: 12/18/2017 Document Reviewed: 12/18/2017 Elsevier Patient Education  [employer].

## 2020-04-04 ENCOUNTER — Ambulatory Visit (INDEPENDENT_AMBULATORY_CARE_PROVIDER_SITE_OTHER)
Admission: RE | Admit: 2020-04-04 | Discharge: 2020-04-04 | Disposition: A | Discharge status: Home / Self Care | Payer: BC Managed Care – PPO | Source: Ambulatory Visit | Attending: Family Medicine | Admitting: Family Medicine

## 2020-04-04 ENCOUNTER — Ambulatory Visit: Payer: BC Managed Care – PPO | Admitting: Family Medicine

## 2020-04-04 ENCOUNTER — Other Ambulatory Visit: Payer: Self-pay

## 2020-04-04 ENCOUNTER — Encounter: Payer: Self-pay | Admitting: Family Medicine

## 2020-04-04 VITALS — BP 130/80 | HR 74 | Temp 97.1°F | Ht 65.0 in | Wt 204.2 lb

## 2020-04-04 DIAGNOSIS — M25551 Pain in right hip: Secondary | ICD-10-CM

## 2020-04-04 DIAGNOSIS — M7061 Trochanteric bursitis, right hip: Secondary | ICD-10-CM

## 2020-04-04 MED ORDER — TRIAMCINOLONE ACETONIDE 40 MG/ML IJ SUSP
40.0000 mg | Freq: Once | INTRAMUSCULAR | Status: AC
  Administered 2020-04-04: 40 mg via INTRA_ARTICULAR

## 2020-04-04 NOTE — Progress Notes (Signed)
Martha Stillson T. Taiwan Millon, MD, [place]  Primary Care and Sports Medicine [facility] [place], [address]  Phone: [phone]  FAX: [phone]  Martha Wade - 64 y.o. female  MRN [id number]  Date of Birth: 1955/11/07  Date: 04/04/2020  PCP: Ria Bush, MD  Referral: Ria Bush, MD  Chief Complaint  Patient presents with  . Hip Pain    Right    This visit occurred during the SARS-CoV-2 public health emergency.  Safety protocols were in place, including screening questions prior to the visit, additional usage of staff PPE, and extensive cleaning of exam room while observing appropriate contact time as indicated for disinfecting solutions.   Subjective:   Martha Wade is a 64 y.o. very pleasant female patient with Body mass index is 33.99 kg/m. who presents with the following:  Did a GTB injection about six week ago.  A night was laying on her side.  She was feeling better, but now her symptoms have returned, they are predominantly laterally on the right.  She does describe some back pain, but this is minimal and it is in the posterior region of the hip.  She also does complain of some anterior hip pain.  Her range of motion has been preserved, and her strength is been intact and fine.  Aside from this she does not describe any radiculopathy, numbness, weakness or any other changes in her lower extremities.  02/22/2020 Last OV with Owens Loffler, MD  She is here today in consultation from my partner Dr. Danise Mina.  She has been having some persistent hip pain for approximately 1 month.  She has been through 2 courses of oral steroids, and she has had some pain in the posterior buttocks to the lateral hip pain as well as some pain that is radiating down to the right knee.   She is here for further evaluation:   In the morning had a lot of difficulty putting some weight on it. Then able to walk.  She  primarily points to her lateral hip, and she denies groin pain.  No pain with rotational movements such as crossing her legs or putting on pants.    She does have some lateral pain going to the knee with a burning sensation at times, but she does not have any posterior radicular symptoms.   Review of Systems is noted in the HPI, as appropriate   Objective:   BP 130/80   Pulse 74   Temp (!) 97.1 F (36.2 C) (Temporal)   Ht 5\' 5"  (1.651 m)   Wt 204 lb 4 oz (92.6 kg)   LMP 11/20/2015   SpO2 97%   BMI 33.99 kg/m    HIP EXAM: SIDE: Right ROM: Abduction, Flexion, Internal and External range of motion: She does have some modest decrease in motion with internal and external range of motion and pain with terminal internal range of motion GTB: Moderate to severe on the right SLR: NEG Knees: No effusion FABER: NT REVERSE FABER: NT, neg Piriformis: NT at direct palpation Str: flexion: 5/5 abduction: 5/5 adduction: 5/5 Strength testing non-tender     Radiology: DG Hip Unilat W OR W/O Pelvis 1V Right  Result Date: 04/04/2020 CLINICAL DATA:  Intermittent hip pain EXAM: DG HIP (WITH OR WITHOUT PELVIS) 1V RIGHT COMPARISON:  None. FINDINGS: SI joints are patent. Pubic symphysis is intact. No fracture or malalignment. Minimal left greater than right degenerative change. IMPRESSION: Minimal degenerative change.  No acute osseous abnormality. Electronically Signed   By: Donavan Foil M.D.   On: 04/04/2020 21:59     Assessment and Plan:     ICD-10-CM   1. Trochanteric bursitis of right hip  M70.61 triamcinolone acetonide (KENALOG-40) injection 40 mg  2. Acute right hip pain  M25.551 DG Hip Unilat W OR W/O Pelvis 1V Right    triamcinolone acetonide (KENALOG-40) injection 40 mg   Continue to work on strengthening hips, range of motion, and flexibility.  Plain films independently reviewed, there is very minimal osteoarthritic change.  No evidence of fractures or  dislocations.  Aspiration/Injection Procedure Note Martha Wade 11/23/1955 Date of procedure: 04/04/2020  Procedure: Large Joint Aspiration / Injection of Hip, Trochanteric Bursa, R Indications: Pain  Procedure Details Verbal consent obtained. Risks, benefits, and alternatives reviewed. Greater trochanter sterilely prepped with Chloraprep. Ethyl Chloride used for anesthesia. 9 cc of Lidocaine 1% injected with 1 mL of Kenalog 40 mg into trochanteric bursa at area of maximal tenderness at greater trochanter. Needle taken to bone to troch bursa, flows easily. Bursa massaged. No bleeding and no complications. Decreased pain after injection. Needle: 22 gauge spinal needle Medication: 1 mL of Kenalog 40 mg   Meds ordered this encounter  Medications  . triamcinolone acetonide (KENALOG-40) injection 40 mg   There are no discontinued medications. Orders Placed This Encounter  Procedures  . DG Hip Unilat W OR W/O Pelvis 1V Right    Follow-up: No follow-ups on file.  Signed,  Martha Deed. Lason Eveland, MD   Outpatient Encounter Medications as of 04/04/2020  Medication Sig  . acetaminophen (TYLENOL) 500 MG tablet Take 1,000 mg by mouth daily.  Marland Kitchen amLODipine (NORVASC) 5 MG tablet Take 1 tablet (5 mg total) by mouth daily.  Marland Kitchen atorvastatin (LIPITOR) 20 MG tablet Take 1 tablet (20 mg total) by mouth daily.  . brimonidine (ALPHAGAN) 0.2 % ophthalmic solution   . Cholecalciferol (VITAMIN D-3) 1000 UNITS CAPS Take 1,000 Units by mouth daily.  . Coenzyme Q10 (COQ-10) 100 MG CAPS Take 100 mg by mouth daily.  . cyanocobalamin 500 MCG tablet Take 500 mcg by mouth daily.   . diclofenac Sodium (VOLTAREN) 1 % GEL Apply 2 g topically 3 (three) times daily.  . dorzolamide (TRUSOPT) 2 % ophthalmic solution PLACE 1 DROP INTO BOTH EYES TWICE A DAY  . esomeprazole (NEXIUM) 40 MG capsule Take 1 capsule (40 mg total) by mouth daily.  . famotidine (PEPCID) 20 MG tablet Take 1 tablet (20 mg total) by mouth at  bedtime.  . gabapentin (NEURONTIN) 300 MG capsule Take 1 capsule (300 mg total) by mouth at bedtime.  Marland Kitchen latanoprost (XALATAN) 0.005 % ophthalmic solution   . methocarbamol (ROBAXIN) 500 MG tablet Take 1 tablet (500 mg total) by mouth 2 (two) times daily as needed for muscle spasms.  . Omega-3 Fatty Acids (FISH OIL) 1000 MG CAPS Take 1 capsule by mouth daily.  . ondansetron (ZOFRAN) 4 MG tablet Take 1 tablet (4 mg total) by mouth every 8 (eight) hours as needed for nausea or vomiting.  . terbinafine (LAMISIL) 250 MG tablet Take 1 tablet (250 mg total) by mouth daily.  . timolol (TIMOPTIC) 0.5 % ophthalmic solution   . [EXPIRED] triamcinolone acetonide (KENALOG-40) injection 40 mg    No facility-administered encounter medications on file as of 04/04/2020.

## 2020-04-06 ENCOUNTER — Other Ambulatory Visit: Payer: Self-pay | Admitting: Family Medicine

## 2020-06-03 ENCOUNTER — Other Ambulatory Visit: Payer: Self-pay | Admitting: Family Medicine

## 2020-07-07 IMAGING — DX DG CHEST 2V
2 series · 2 of 2 positions shown · non-contrast
Comparison: 05/17/2013

CLINICAL DATA: Coughing for 3 weeks, hemoptysis twice, history
hypertension

EXAM:
CHEST - 2 VIEW

[chest pa]
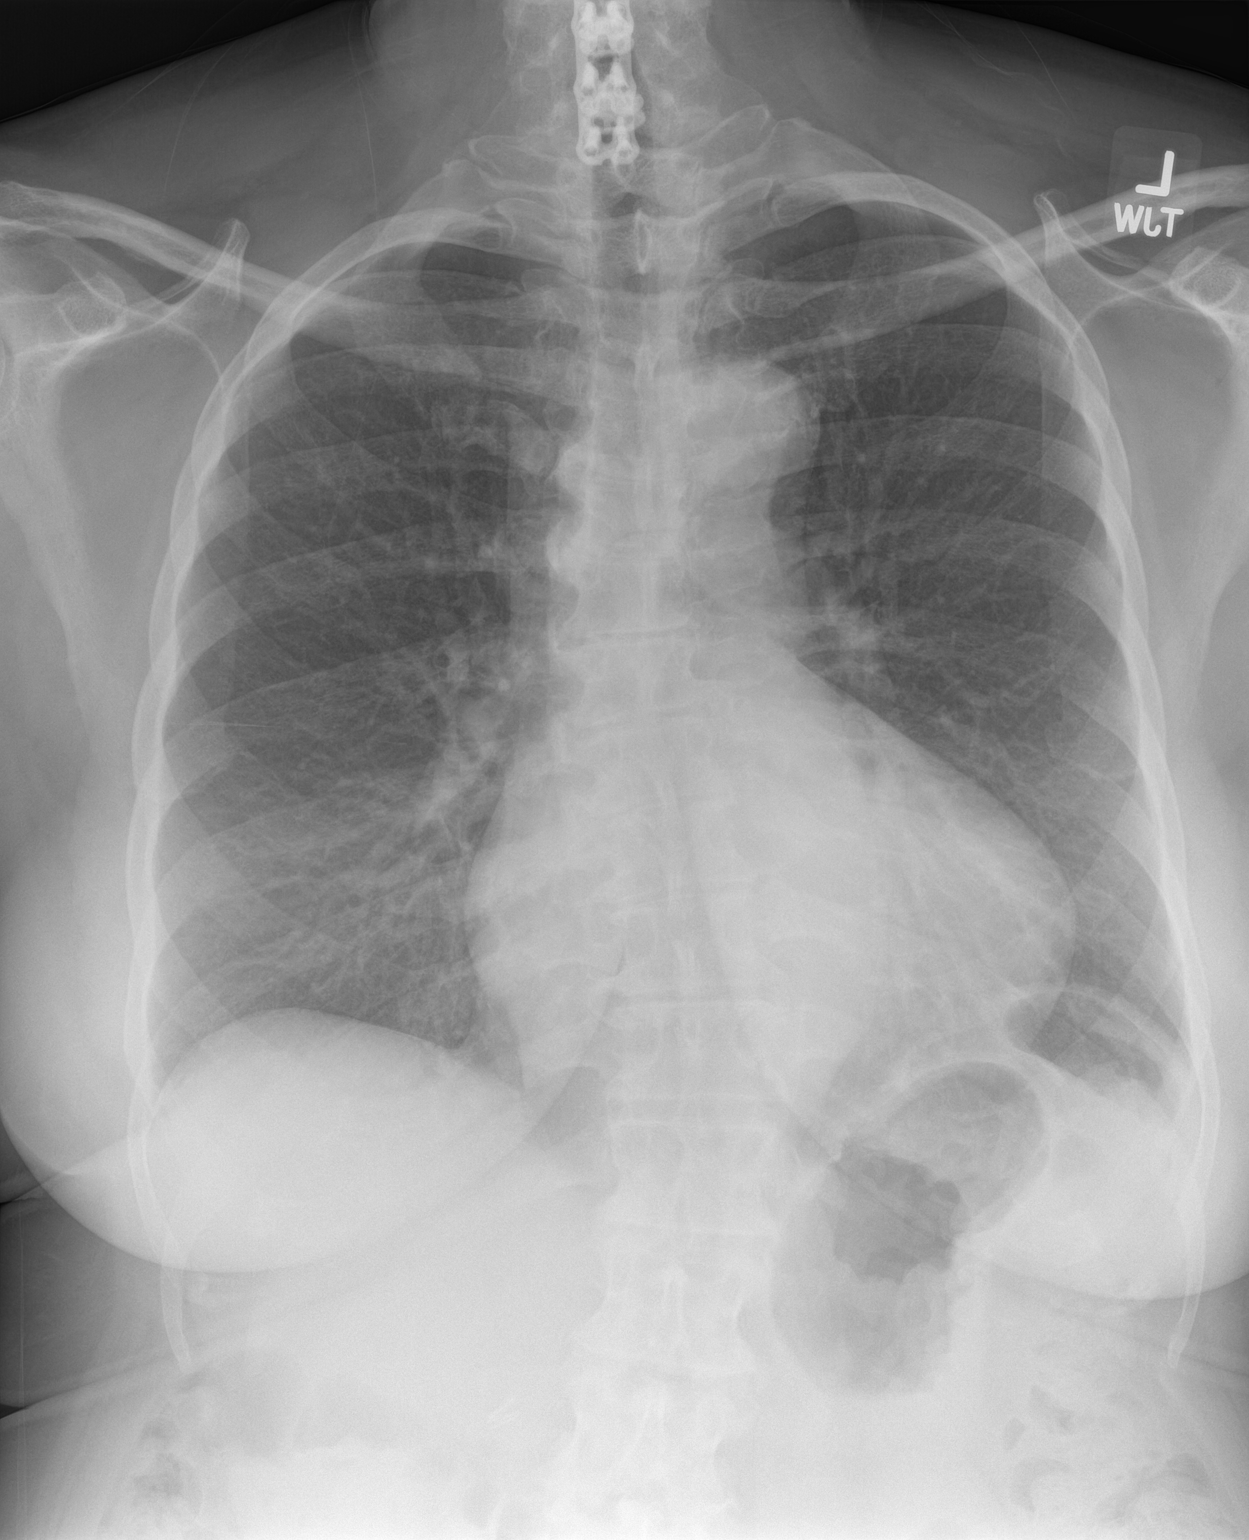

[chest lat]
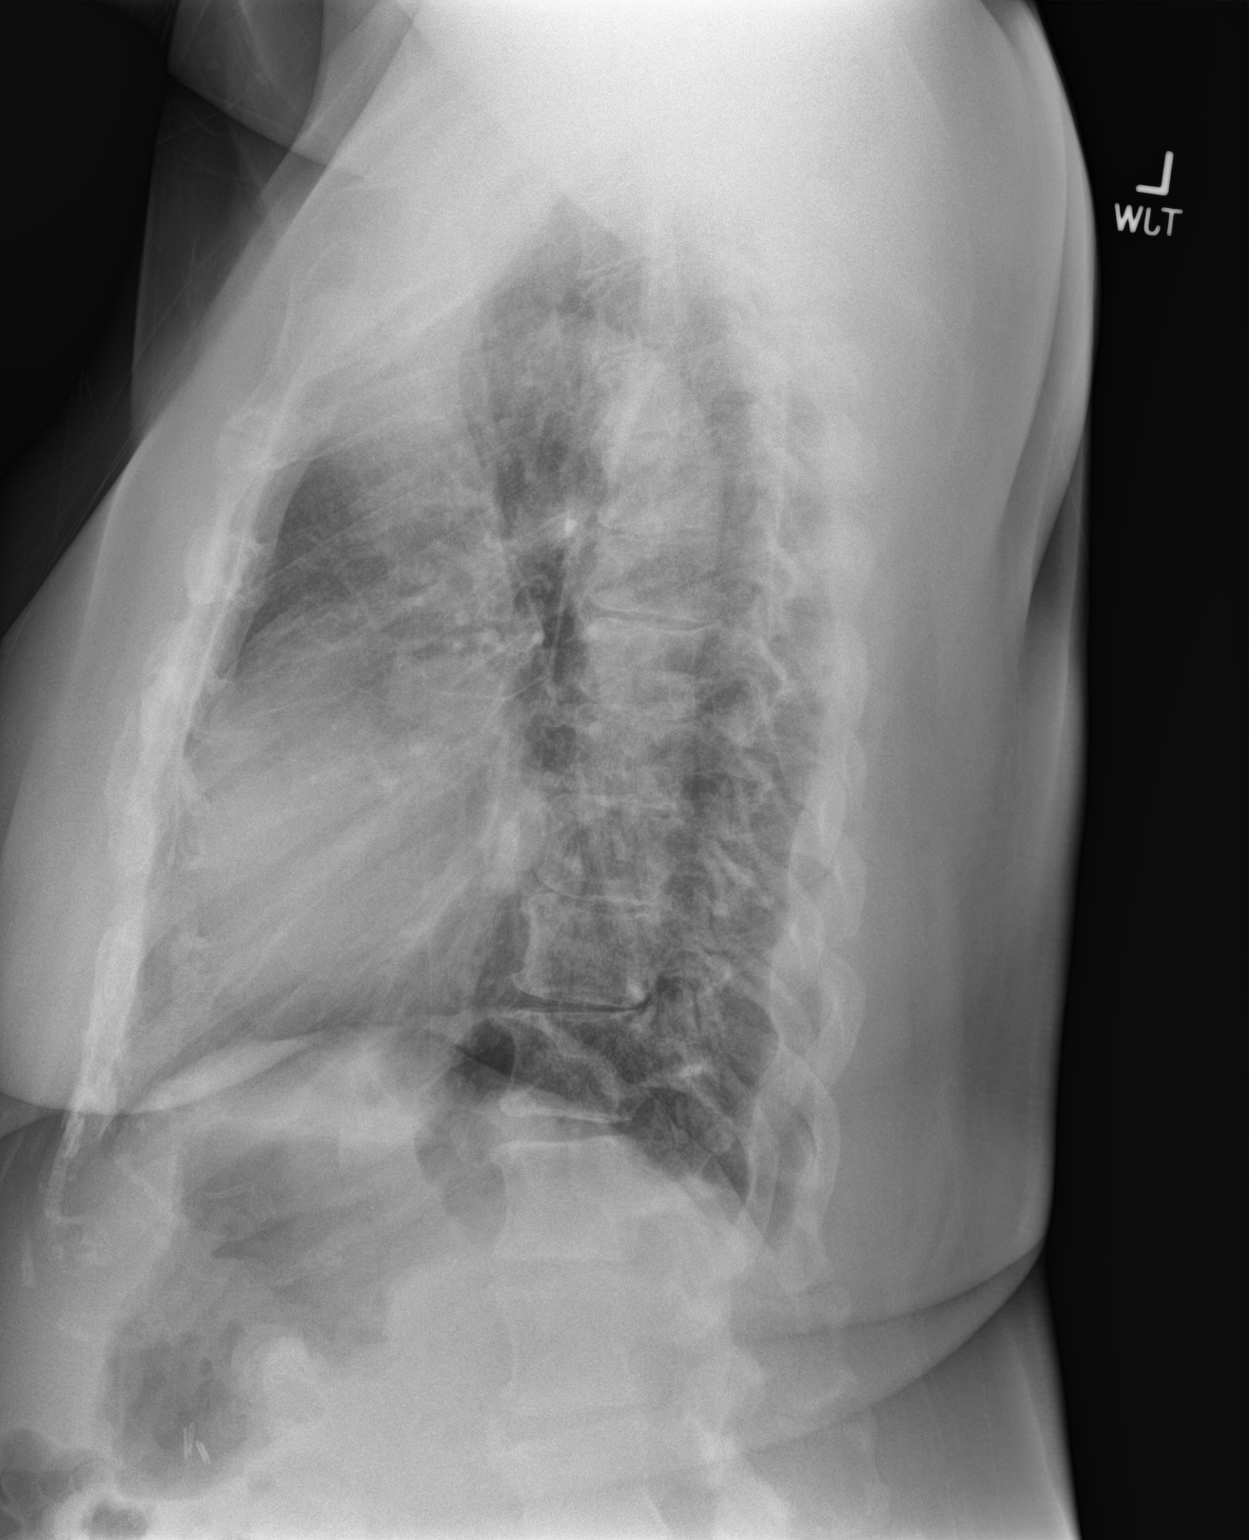

[2 of 2 positions shown; findings below may reference images not displayed]

FINDINGS: Enlargement of cardiac silhouette.

Mediastinal contours and pulmonary vascularity normal.

Lungs clear.

No pulmonary infiltrate, pleural effusion or pneumothorax.

Thoracolumbar scoliosis and scattered degenerative disc disease
changes.

Prior cervical spine fusion.
IMPRESSION: Enlargement of cardiac silhouette.

No acute abnormalities.

## 2020-07-22 ENCOUNTER — Other Ambulatory Visit: Payer: Self-pay | Admitting: Family Medicine

## 2020-07-23 NOTE — Telephone Encounter (Signed)
Pharmacy requests refill on: Atorvastatin 20 mg & Amlodipine 5 mg   LAST REFILL: 07/29/2019 (Q-90, R-3) LAST OV: 03/21/2020 NEXT OV: 09/10/2020 PHARMACY: [id number] [place], [place]

## 2020-07-31 ENCOUNTER — Other Ambulatory Visit: Payer: Self-pay | Admitting: Family Medicine

## 2020-07-31 NOTE — Telephone Encounter (Signed)
Refill request Gabapentin Last refill 06/04/20 #30/1 Last office visit 04/04/20 Dr. Lorelei Pont Upcoming appointment 09/10/20

## 2020-08-13 ENCOUNTER — Encounter: Payer: BC Managed Care – PPO | Admitting: Family Medicine

## 2020-08-21 ENCOUNTER — Encounter: Payer: Self-pay | Admitting: Family Medicine

## 2020-08-21 ENCOUNTER — Ambulatory Visit: Payer: Self-pay | Admitting: Family Medicine

## 2020-08-21 ENCOUNTER — Other Ambulatory Visit: Payer: Self-pay

## 2020-08-21 VITALS — BP 118/74 | HR 81 | Temp 97.7°F | Ht 65.0 in | Wt 205.0 lb

## 2020-08-21 DIAGNOSIS — M7061 Trochanteric bursitis, right hip: Secondary | ICD-10-CM

## 2020-08-21 NOTE — Assessment & Plan Note (Addendum)
H/o R trochanteric bursitis s/p steroid injections x2 (02/2020, 03/2020) with benefit - now with recurrence over the last 2-3 weeks. Provided with home exercises on greater hip bursitis from [facility] pt advisor. Will also refer to outpatient PT, recommend diclofenac course (she has this at home, has previously tolerated well), encouraged f/u with Dr Lorelei Pont to consider rpt injection.

## 2020-08-21 NOTE — Patient Instructions (Addendum)
I think you have recurrent trochanteric bursitis. Treat with exercises provided today, schedule outpatient physical therapy (we will call you) and schedule repeat steroid shot with Dr Lorelei Pont.  Take diclofenac 75mg  twice daily for 3 days with meals then as needed.

## 2020-08-21 NOTE — Progress Notes (Signed)
Patient ID: Martha Wade, female    DOB: January 03, 1956, 65 y.o.   MRN: [id number]  This visit was conducted in person.  BP 118/74   Pulse 81   Temp 97.7 F (36.5 C) (Temporal)   Ht 5\' 5"  (1.651 m)   Wt 205 lb (93 kg)   LMP 11/20/2015   SpO2 98%   BMI 34.11 kg/m    CC: R hip pain  Subjective:   HPI: Martha Wade is a 65 y.o. female presenting on 08/21/2020 for Hip Pain (C/o continued pain in right hip.  Started about 2 mos ago.  )   H/o trochanteric bursitis to R hip saw Dr Lorelei Pont 03/2020 s/p steroid injection x2 with limited benefit. Also was taking diclofenac 75mg  bid.   Over the past 2-3 weeks R lateral hip pain has recurred. Notes some R lower back pain as well.  Managing with topical OTC creams as well as restarting diclofenac with benefit and gabapentin 300mg  at night. Denies inciting trauma/injury or falls.  No groin pain or lower abdominal pain.   R handed.  Wants to start stretching routine in the mornings.  Xray last year without significant hip arthritis     Relevant past medical, surgical, family and social history reviewed and updated as indicated. Interim medical history since our last visit reviewed. Allergies and medications reviewed and updated. Outpatient Medications Prior to Visit  Medication Sig Dispense Refill  . acetaminophen (TYLENOL) 500 MG tablet Take 1,000 mg by mouth daily.    Marland Kitchen amLODipine (NORVASC) 5 MG tablet TAKE 1 TABLET(5 MG) BY MOUTH DAILY 90 tablet 0  . atorvastatin (LIPITOR) 20 MG tablet TAKE 1 TABLET(20 MG) BY MOUTH DAILY 90 tablet 0  . brimonidine (ALPHAGAN) 0.2 % ophthalmic solution     . Cholecalciferol (VITAMIN D-3) 1000 UNITS CAPS Take 1,000 Units by mouth daily.    . Coenzyme Q10 (COQ-10) 100 MG CAPS Take 100 mg by mouth daily.    . cyanocobalamin 500 MCG tablet Take 500 mcg by mouth daily.     . diclofenac Sodium (VOLTAREN) 1 % GEL Apply 2 g topically 3 (three) times daily. 100 g 1  . dorzolamide (TRUSOPT) 2 % ophthalmic  solution PLACE 1 DROP INTO BOTH EYES TWICE A DAY  5  . esomeprazole (NEXIUM) 40 MG capsule Take 1 capsule (40 mg total) by mouth daily. 30 capsule 6  . famotidine (PEPCID) 20 MG tablet Take 1 tablet (20 mg total) by mouth at bedtime. 30 tablet 6  . gabapentin (NEURONTIN) 300 MG capsule TAKE 1 CAPSULE(300 MG) BY MOUTH AT BEDTIME 90 capsule 1  . latanoprost (XALATAN) 0.005 % ophthalmic solution   4  . methocarbamol (ROBAXIN) 500 MG tablet Take 1 tablet (500 mg total) by mouth 2 (two) times daily as needed for muscle spasms. 30 tablet 3  . Omega-3 Fatty Acids (FISH OIL) 1000 MG CAPS Take 1 capsule by mouth daily.    . ondansetron (ZOFRAN) 4 MG tablet Take 1 tablet (4 mg total) by mouth every 8 (eight) hours as needed for nausea or vomiting. 20 tablet 0  . terbinafine (LAMISIL) 250 MG tablet Take 1 tablet (250 mg total) by mouth daily. 45 tablet 0  . timolol (TIMOPTIC) 0.5 % ophthalmic solution   4   No facility-administered medications prior to visit.     Per HPI unless specifically indicated in ROS section below Review of Systems Objective:  BP 118/74   Pulse 81   Temp 97.7 F (36.5  C) (Temporal)   Ht 5\' 5"  (1.651 m)   Wt 205 lb (93 kg)   LMP 11/20/2015   SpO2 98%   BMI 34.11 kg/m   Wt Readings from Last 3 Encounters:  08/21/20 205 lb (93 kg)  04/04/20 204 lb 4 oz (92.6 kg)  03/21/20 205 lb 9 oz (93.2 kg)      Physical Exam Vitals and nursing note reviewed.  Constitutional:      Appearance: Normal appearance. She is obese. She is not ill-appearing.  Musculoskeletal:        General: Tenderness present.     Right lower leg: No edema.     Left lower leg: No edema.     Comments:  No pain midline spine No paraspinous mm tenderness Neg SLR bilaterally. No pain with int/ext rotation at hip. Neg FABER. No pain at SIJ, or sciatic notch bilaterally.  Discomfort to palpation at R lateral hip over trochanteric bursa  Skin:    General: Skin is warm and dry.     Findings: No rash.   Neurological:     General: No focal deficit present.     Mental Status: She is alert.     Comments: 5/5 strength BLE  Psychiatric:        Mood and Affect: Mood normal.        Behavior: Behavior normal.       Results for orders placed or performed in visit on 07/22/19  CBC with Differential/Platelet  Result Value Ref Range   WBC 6.6 4.0 - 10.5 K/uL   RBC 4.23 3.87 - 5.11 Mil/uL   Hemoglobin 13.0 12.0 - 15.0 g/dL   HCT 39.0 36.0 - 46.0 %   MCV 92.3 78.0 - 100.0 fl   MCHC 33.4 30.0 - 36.0 g/dL   RDW 13.7 11.5 - 15.5 %   Platelets 242.0 150.0 - 400.0 K/uL   Neutrophils Relative % 51.9 43.0 - 77.0 %   Lymphocytes Relative 34.5 12.0 - 46.0 %   Monocytes Relative 10.8 3.0 - 12.0 %   Eosinophils Relative 2.2 0.0 - 5.0 %   Basophils Relative 0.6 0.0 - 3.0 %   Neutro Abs 3.4 1.4 - 7.7 K/uL   Lymphs Abs 2.3 0.7 - 4.0 K/uL   Monocytes Absolute 0.7 0.1 - 1.0 K/uL   Eosinophils Absolute 0.1 0.0 - 0.7 K/uL   Basophils Absolute 0.0 0.0 - 0.1 K/uL  Microalbumin / creatinine urine ratio  Result Value Ref Range   Microalb, Ur 4.1 (H) 0.0 - 1.9 mg/dL   Creatinine,U 357.6 mg/dL   Microalb Creat Ratio 1.2 0.0 - 30.0 mg/g  Lipid panel  Result Value Ref Range   Cholesterol 247 (H) 0 - 200 mg/dL   Triglycerides 81.0 0.0 - 149.0 mg/dL   HDL 45.50 >39.00 mg/dL   VLDL 16.2 0.0 - 40.0 mg/dL   LDL Cholesterol 186 (H) 0 - 99 mg/dL   Total CHOL/HDL Ratio 5    NonHDL 201.89   Comprehensive metabolic panel  Result Value Ref Range   Sodium 140 135 - 145 mEq/L   Potassium 3.6 3.5 - 5.1 mEq/L   Chloride 105 96 - 112 mEq/L   CO2 28 19 - 32 mEq/L   Glucose, Bld 106 (H) 70 - 99 mg/dL   BUN 18 6 - 23 mg/dL   Creatinine, Ser 1.16 0.40 - 1.20 mg/dL   Total Bilirubin 0.5 0.2 - 1.2 mg/dL   Alkaline Phosphatase 75 39 - 117 U/L   AST  17 0 - 37 U/L   ALT 20 0 - 35 U/L   Total Protein 8.2 6.0 - 8.3 g/dL   Albumin 4.1 3.5 - 5.2 g/dL   GFR 57.15 (L) >60.00 mL/min   Calcium 9.5 8.4 - 10.5 mg/dL  VITAMIN  D 25 Hydroxy (Vit-D Deficiency, Fractures)  Result Value Ref Range   VITD 40.64 30.00 - 100.00 ng/mL   DG Hip Unilat W OR W/O Pelvis 1V Right CLINICAL DATA:  Intermittent hip pain  EXAM: DG HIP (WITH OR WITHOUT PELVIS) 1V RIGHT  COMPARISON:  None.  FINDINGS: SI joints are patent. Pubic symphysis is intact. No fracture or malalignment. Minimal left greater than right degenerative change.  IMPRESSION: Minimal degenerative change. No acute osseous abnormality.  Electronically Signed   By: Donavan Foil M.D.   On: 04/04/2020 21:59  Assessment & Plan:  This visit occurred during the SARS-CoV-2 public health emergency.  Safety protocols were in place, including screening questions prior to the visit, additional usage of staff PPE, and extensive cleaning of exam room while observing appropriate contact time as indicated for disinfecting solutions.   Problem List Items Addressed This Visit    Trochanteric bursitis of right hip - Primary    H/o R trochanteric bursitis s/p steroid injections x2 (02/2020, 03/2020) with benefit - now with recurrence over the last 2-3 weeks. Provided with home exercises on greater hip bursitis from [facility] pt advisor. Will also refer to outpatient PT, recommend diclofenac course (she has this at home, has previously tolerated well), encouraged f/u with Dr Lorelei Pont to consider rpt injection.       Relevant Orders   Ambulatory referral to Physical Therapy       No orders of the defined types were placed in this encounter.  Orders Placed This Encounter  Procedures  . Ambulatory referral to Physical Therapy    Referral Priority:   Routine    Referral Type:   Physical Medicine    Referral Reason:   Specialty Services Required    Requested Specialty:   Physical Therapy    Number of Visits Requested:   1    Patient Instructions  I think you have recurrent trochanteric bursitis. Treat with exercises provided today, schedule outpatient physical therapy (we will  call you) and schedule repeat steroid shot with Dr Lorelei Pont.  Take diclofenac 75mg  twice daily for 3 days with meals then as needed.   Follow up plan: Return if symptoms worsen or fail to improve.  Ria Bush, MD

## 2020-09-03 ENCOUNTER — Other Ambulatory Visit: Payer: Self-pay | Admitting: Family Medicine

## 2020-09-03 DIAGNOSIS — I10 Essential (primary) hypertension: Secondary | ICD-10-CM

## 2020-09-03 DIAGNOSIS — E785 Hyperlipidemia, unspecified: Secondary | ICD-10-CM

## 2020-09-03 DIAGNOSIS — E559 Vitamin D deficiency, unspecified: Secondary | ICD-10-CM

## 2020-09-03 DIAGNOSIS — N289 Disorder of kidney and ureter, unspecified: Secondary | ICD-10-CM

## 2020-09-03 NOTE — Telephone Encounter (Signed)
Refill request Methocarbamol Last office visit 08/21/20 Last refill 02/10/20 #30/3

## 2020-09-06 ENCOUNTER — Other Ambulatory Visit (INDEPENDENT_AMBULATORY_CARE_PROVIDER_SITE_OTHER): Payer: BC Managed Care – PPO

## 2020-09-06 ENCOUNTER — Other Ambulatory Visit: Payer: Self-pay

## 2020-09-06 DIAGNOSIS — E559 Vitamin D deficiency, unspecified: Secondary | ICD-10-CM

## 2020-09-06 DIAGNOSIS — N289 Disorder of kidney and ureter, unspecified: Secondary | ICD-10-CM | POA: Diagnosis not present

## 2020-09-06 DIAGNOSIS — I10 Essential (primary) hypertension: Secondary | ICD-10-CM | POA: Diagnosis not present

## 2020-09-06 DIAGNOSIS — E785 Hyperlipidemia, unspecified: Secondary | ICD-10-CM | POA: Diagnosis not present

## 2020-09-06 LAB — LIPID PANEL
Cholesterol: 171 mg/dL (ref 0–200)
HDL: 44.6 mg/dL (ref >39.00)
LDL Cholesterol: 99 mg/dL (ref 0–99)
NonHDL: 126.86
Total CHOL/HDL Ratio: 4
Triglycerides: 141 mg/dL (ref 0.0–149.0)
VLDL: 28.2 mg/dL (ref 0.0–40.0)

## 2020-09-06 LAB — MICROALBUMIN / CREATININE URINE RATIO
Creatinine,U: 266 mg/dL
Microalb Creat Ratio: 0.7 mg/g (ref 0.0–30.0)
Microalb, Ur: 2 mg/dL — ABNORMAL HIGH (ref 0.0–1.9)

## 2020-09-06 LAB — CBC WITH DIFFERENTIAL/PLATELET
Basophils Absolute: 0 10*3/uL (ref 0.0–0.1)
Basophils Relative: 0.5 % (ref 0.0–3.0)
Eosinophils Absolute: 0.2 10*3/uL (ref 0.0–0.7)
Eosinophils Relative: 3.2 % (ref 0.0–5.0)
HCT: 37.2 % (ref 36.0–46.0)
Hemoglobin: 12.3 g/dL (ref 12.0–15.0)
Lymphocytes Relative: 40.2 % (ref 12.0–46.0)
Lymphs Abs: 1.9 10*3/uL (ref 0.7–4.0)
MCHC: 33.2 g/dL (ref 30.0–36.0)
MCV: 92.4 fl (ref 78.0–100.0)
Monocytes Absolute: 0.4 10*3/uL (ref 0.1–1.0)
Monocytes Relative: 8.2 % (ref 3.0–12.0)
Neutro Abs: 2.3 10*3/uL (ref 1.4–7.7)
Neutrophils Relative %: 47.9 % (ref 43.0–77.0)
Platelets: 215 10*3/uL (ref 150.0–400.0)
RBC: 4.02 Mil/uL (ref 3.87–5.11)
RDW: 13.6 % (ref 11.5–15.5)
WBC: 4.9 10*3/uL (ref 4.0–10.5)

## 2020-09-06 LAB — COMPREHENSIVE METABOLIC PANEL
ALT: 16 U/L (ref 0–35)
AST: 14 U/L (ref 0–37)
Albumin: 4 g/dL (ref 3.5–5.2)
Alkaline Phosphatase: 79 U/L (ref 39–117)
BUN: 15 mg/dL (ref 6–23)
CO2: 31 mEq/L (ref 19–32)
Calcium: 9.4 mg/dL (ref 8.4–10.5)
Chloride: 105 mEq/L (ref 96–112)
Creatinine, Ser: 1.1 mg/dL (ref 0.40–1.20)
GFR: 53.36 mL/min — ABNORMAL LOW (ref >60.00)
Glucose, Bld: 98 mg/dL (ref 70–99)
Potassium: 3.8 mEq/L (ref 3.5–5.1)
Sodium: 142 mEq/L (ref 135–145)
Total Bilirubin: 0.5 mg/dL (ref 0.2–1.2)
Total Protein: 7.5 g/dL (ref 6.0–8.3)

## 2020-09-06 LAB — VITAMIN D 25 HYDROXY (VIT D DEFICIENCY, FRACTURES): VITD: 30.46 ng/mL (ref 30.00–100.00)

## 2020-09-10 ENCOUNTER — Ambulatory Visit (INDEPENDENT_AMBULATORY_CARE_PROVIDER_SITE_OTHER): Payer: BC Managed Care – PPO | Admitting: Family Medicine

## 2020-09-10 ENCOUNTER — Encounter: Payer: Self-pay | Admitting: Family Medicine

## 2020-09-10 ENCOUNTER — Other Ambulatory Visit: Payer: Self-pay

## 2020-09-10 VITALS — BP 140/82 | HR 80 | Temp 97.9°F | Ht 64.5 in | Wt 203.1 lb

## 2020-09-10 DIAGNOSIS — E669 Obesity, unspecified: Secondary | ICD-10-CM

## 2020-09-10 DIAGNOSIS — I10 Essential (primary) hypertension: Secondary | ICD-10-CM | POA: Diagnosis not present

## 2020-09-10 DIAGNOSIS — M545 Low back pain, unspecified: Secondary | ICD-10-CM

## 2020-09-10 DIAGNOSIS — M79604 Pain in right leg: Secondary | ICD-10-CM

## 2020-09-10 DIAGNOSIS — E785 Hyperlipidemia, unspecified: Secondary | ICD-10-CM

## 2020-09-10 DIAGNOSIS — M7061 Trochanteric bursitis, right hip: Secondary | ICD-10-CM

## 2020-09-10 DIAGNOSIS — E559 Vitamin D deficiency, unspecified: Secondary | ICD-10-CM

## 2020-09-10 DIAGNOSIS — Z0000 Encounter for general adult medical examination without abnormal findings: Secondary | ICD-10-CM | POA: Diagnosis not present

## 2020-09-10 DIAGNOSIS — K2100 Gastro-esophageal reflux disease with esophagitis, without bleeding: Secondary | ICD-10-CM

## 2020-09-10 DIAGNOSIS — N289 Disorder of kidney and ureter, unspecified: Secondary | ICD-10-CM

## 2020-09-10 MED ORDER — ATORVASTATIN CALCIUM 20 MG PO TABS
20.0000 mg | ORAL_TABLET | Freq: Every day | ORAL | 3 refills | Status: DC
Start: 2020-09-10 — End: 2021-09-17

## 2020-09-10 MED ORDER — AMLODIPINE BESYLATE 5 MG PO TABS
5.0000 mg | ORAL_TABLET | Freq: Every day | ORAL | 3 refills | Status: DC
Start: 2020-09-10 — End: 2021-09-17

## 2020-09-10 MED ORDER — FAMOTIDINE 20 MG PO TABS
20.0000 mg | ORAL_TABLET | Freq: Every day | ORAL | 3 refills | Status: DC
Start: 2020-09-10 — End: 2021-09-06

## 2020-09-10 MED ORDER — GABAPENTIN 300 MG PO CAPS
300.0000 mg | ORAL_CAPSULE | Freq: Every day | ORAL | 3 refills | Status: DC
Start: 2020-09-10 — End: 2021-02-06

## 2020-09-10 MED ORDER — ESOMEPRAZOLE MAGNESIUM 40 MG PO CPDR
40.0000 mg | DELAYED_RELEASE_CAPSULE | Freq: Every day | ORAL | 3 refills | Status: DC
Start: 2020-09-10 — End: 2021-07-04

## 2020-09-10 NOTE — Assessment & Plan Note (Signed)
Encouraged healthy diet and lifestyle choices to affect sustainable weight loss.  ?

## 2020-09-10 NOTE — Assessment & Plan Note (Signed)
Stable period on vit D 1000 IU daily - although levels have dropped this year. Will continue to monitor.

## 2020-09-10 NOTE — Assessment & Plan Note (Addendum)
Continue nexium 40mg  daily + pepcid nightly (started 03/2020).

## 2020-09-10 NOTE — Patient Instructions (Addendum)
Call to reschedule mammogram.  Send [place] date of COVID shot.  Consider shingrix vaccine, let us know if interested.  Good to see you today Return as needed or in 1 year for next physical.   Health Maintenance for Postmenopausal Women Menopause is a normal process in which your ability to get pregnant comes to an end. This process happens slowly over many months or years, usually between the ages of 17 and 63. Menopause is complete when you have missed your menstrual periods for 12 months. It is important to talk with your health care provider about some of the most common conditions that affect women after menopause (postmenopausal women). These include heart disease, cancer, and bone loss (osteoporosis). Adopting a healthy lifestyle and getting preventive care can help to promote your health and wellness. The actions you take can also lower your chances of developing some of these common conditions. What should I know about menopause? During menopause, you may get a number of symptoms, such as:  Hot flashes. These can be moderate or severe.  Night sweats.  Decrease in sex drive.  Mood swings.  Headaches.  Tiredness.  Irritability.  Memory problems.  Insomnia. Choosing to treat or not to treat these symptoms is a decision that you make with your health care provider. Do I need hormone replacement therapy?  Hormone replacement therapy is effective in treating symptoms that are caused by menopause, such as hot flashes and night sweats.  Hormone replacement carries certain risks, especially as you become older. If you are thinking about using estrogen or estrogen with progestin, discuss the benefits and risks with your health care provider. What is my risk for heart disease and stroke? The risk of heart disease, heart attack, and stroke increases as you age. One of the causes may be a change in the body's hormones during menopause. This can affect how your body uses dietary fats,  triglycerides, and cholesterol. Heart attack and stroke are medical emergencies. There are many things that you can do to help prevent heart disease and stroke. Watch your blood pressure  High blood pressure causes heart disease and increases the risk of stroke. This is more likely to develop in people who have high blood pressure readings, are of African descent, or are overweight.  Have your blood pressure checked: ? Every 3-5 years if you are 21-69 years of age. ? Every year if you are 48 years old or older. Eat a healthy diet  Eat a diet that includes plenty of vegetables, fruits, low-fat dairy products, and lean protein.  Do not eat a lot of foods that are high in solid fats, added sugars, or sodium.   Get regular exercise Get regular exercise. This is one of the most important things you can do for your health. Most adults should:  Try to exercise for at least 150 minutes each week. The exercise should increase your heart rate and make you sweat (moderate-intensity exercise).  Try to do strengthening exercises at least twice each week. Do these in addition to the moderate-intensity exercise.  Spend less time sitting. Even light physical activity can be beneficial. Other tips  Work with your health care provider to achieve or maintain a healthy weight.  Do not use any products that contain nicotine or tobacco, such as cigarettes, e-cigarettes, and chewing tobacco. If you need help quitting, ask your health care provider.  Know your numbers. Ask your health care provider to check your cholesterol and your blood sugar (glucose). Continue to  have your blood tested as directed by your health care provider. Do I need screening for cancer? Depending on your health history and family history, you may need to have cancer screening at different stages of your life. This may include screening for:  Breast cancer.  Cervical cancer.  Lung cancer.  Colorectal cancer. What is my risk for  osteoporosis? After menopause, you may be at increased risk for osteoporosis. Osteoporosis is a condition in which bone destruction happens more quickly than new bone creation. To help prevent osteoporosis or the bone fractures that can happen because of osteoporosis, you may take the following actions:  If you are 81-24 years old, get at least 1,000 mg of calcium and at least 600 mg of vitamin D per day.  If you are older than age 52 but younger than age 55, get at least 1,200 mg of calcium and at least 600 mg of vitamin D per day.  If you are older than age 59, get at least 1,200 mg of calcium and at least 800 mg of vitamin D per day. Smoking and drinking excessive alcohol increase the risk of osteoporosis. Eat foods that are rich in calcium and vitamin D, and do weight-bearing exercises several times each week as directed by your health care provider. How does menopause affect my mental health? Depression may occur at any age, but it is more common as you become older. Common symptoms of depression include:  Low or sad mood.  Changes in sleep patterns.  Changes in appetite or eating patterns.  Feeling an overall lack of motivation or enjoyment of activities that you previously enjoyed.  Frequent crying spells. Talk with your health care provider if you think that you are experiencing depression. General instructions See your health care provider for regular wellness exams and vaccines. This may include:  Scheduling regular health, dental, and eye exams.  Getting and maintaining your vaccines. These include: ? Influenza vaccine. Get this vaccine each year before the flu season begins. ? Pneumonia vaccine. ? Shingles vaccine. ? Tetanus, diphtheria, and pertussis (Tdap) booster vaccine. Your health care provider may also recommend other immunizations. Tell your health care provider if you have ever been abused or do not feel safe at home. Summary  Menopause is a normal process in  which your ability to get pregnant comes to an end.  This condition causes hot flashes, night sweats, decreased interest in sex, mood swings, headaches, or lack of sleep.  Treatment for this condition may include hormone replacement therapy.  Take actions to keep yourself healthy, including exercising regularly, eating a healthy diet, watching your weight, and checking your blood pressure and blood sugar levels.  Get screened for cancer and depression. Make sure that you are up to date with all your vaccines. This information is not intended to replace advice given to you by your health care provider. Make sure you discuss any questions you have with your health care provider. Document Revised: 04/21/2018 Document Reviewed: 04/21/2018 Elsevier Patient Education  2021 [employer].

## 2020-09-10 NOTE — Assessment & Plan Note (Signed)
Encouraged good water intake, avoiding neprhotoxic agents.

## 2020-09-10 NOTE — Addendum Note (Signed)
Addended by: Ria Bush on: 09/10/2020 04:40 PM   Modules accepted: Orders

## 2020-09-10 NOTE — Assessment & Plan Note (Signed)
Ongoing. Pending PT session next month.

## 2020-09-10 NOTE — Assessment & Plan Note (Signed)
Preventative protocols reviewed and updated unless pt declined. Discussed healthy diet and lifestyle.  

## 2020-09-10 NOTE — Assessment & Plan Note (Signed)
Chronic, stable. Continue current regimen. 

## 2020-09-10 NOTE — Progress Notes (Addendum)
Patient ID: Martha Wade, female    DOB: 09-Jun-1955, 65 y.o.   MRN: [id number]  This visit was conducted in person.  BP 140/82   Pulse 80   Temp 97.9 F (36.6 C) (Temporal)   Ht 5' 4.5" (1.638 m)   Wt 203 lb 2 oz (92.1 kg)   LMP 11/20/2015   SpO2 97%   BMI 34.33 kg/m    CC: CPE  Subjective:   HPI: Martha Wade is a 65 y.o. female presenting on 09/10/2020 for Annual Exam (Pt accompanied by husband, Martha Wade- temp 97.7.)   Caring for brother with Alzheimer's on weekends, busy with work on week days.  Using instaflex advanced with turmeric and collagen.   Preventative: COLONOSCOPY WITH ESOPHAGOGASTRODUODENOSCOPY (EGD) 07/2018 - colon - int hem, rpt 5 yrs, EGD - reflux esophagitis Vira Agar) Well woman -last pap 09/2015 - WNL except endocervical zone absent. She sees [facility] - thinks saw them in 2020.  Mammogram -07/2019 Birads1 DEXA 11/2012 - WNL  Lung cancer screening - not eligible  Flu shot - declines  COVID vaccine Pfizer 07/2019, 08/2019, booster x1 Tdap 12/2012  Shingrix - discussed  Seat belt use discussed Sunscreen use discussed, no changing moles on skin.  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 Interim medical history since our last visit reviewed. Allergies and medications reviewed and updated. Outpatient Medications Prior to Visit  Medication Sig Dispense Refill  . acetaminophen (TYLENOL) 500 MG tablet Take 1,000 mg by mouth daily.    . brimonidine (ALPHAGAN) 0.2 % ophthalmic solution     . Cholecalciferol  (VITAMIN D-3) 1000 UNITS CAPS Take 1,000 Units by mouth daily.    . Coenzyme Q10 (COQ-10) 100 MG CAPS Take 100 mg by mouth daily.    . cyanocobalamin 500 MCG tablet Take 500 mcg by mouth daily.     . diclofenac Sodium (VOLTAREN) 1 % GEL Apply 2 g topically 3 (three) times daily. 100 g 1  . dorzolamide (TRUSOPT) 2 % ophthalmic solution PLACE 1 DROP INTO BOTH EYES TWICE A DAY  5  . latanoprost (XALATAN) 0.005 % ophthalmic solution   4  . methocarbamol (ROBAXIN) 500 MG tablet TAKE 1 TABLET(500 MG) BY MOUTH TWICE DAILY AS NEEDED FOR MUSCLE SPASMS 30 tablet 3  . Omega-3 Fatty Acids (FISH OIL) 1000 MG CAPS Take 1 capsule by mouth daily.    . ondansetron (ZOFRAN) 4 MG tablet Take 1 tablet (4 mg total) by mouth every 8 (eight) hours as needed for nausea or vomiting. 20 tablet 0  . timolol (TIMOPTIC) 0.5 % ophthalmic solution   4  . amLODipine (NORVASC) 5 MG tablet TAKE 1 TABLET(5 MG) BY MOUTH DAILY 90 tablet 0  . atorvastatin (LIPITOR) 20 MG tablet TAKE 1 TABLET(20 MG) BY MOUTH DAILY 90 tablet 0  . esomeprazole (NEXIUM) 40 MG capsule Take 1 capsule (40 mg total) by mouth daily. 30 capsule 6  . famotidine (PEPCID) 20 MG tablet Take 1 tablet (20 mg total) by mouth at bedtime. 30 tablet 6  . gabapentin (NEURONTIN) 300 MG capsule TAKE 1  CAPSULE(300 MG) BY MOUTH AT BEDTIME 90 capsule 1  . terbinafine (LAMISIL) 250 MG tablet Take 1 tablet (250 mg total) by mouth daily. 45 tablet 0   No facility-administered medications prior to visit.     Per HPI unless specifically indicated in ROS section below Review of Systems  Constitutional: Negative for activity change, appetite change, chills, fatigue, fever and unexpected weight change.  HENT: Negative for hearing loss.   Eyes: Negative for visual disturbance.  Respiratory: Negative for cough, chest tightness, shortness of breath and wheezing.   Cardiovascular: Positive for leg swelling (ankles). Negative for chest pain and palpitations.  Gastrointestinal:  Negative for abdominal distention, abdominal pain, blood in stool, constipation, diarrhea, nausea and vomiting.  Genitourinary: Negative for difficulty urinating and hematuria.  Musculoskeletal: Negative for arthralgias, myalgias and neck pain.  Skin: Negative for rash.  Neurological: Negative for dizziness, seizures, syncope and headaches.  Hematological: Negative for adenopathy. Does not bruise/bleed easily.  Psychiatric/Behavioral: Negative for dysphoric mood. The patient is not nervous/anxious.    Objective:  BP 140/82   Pulse 80   Temp 97.9 F (36.6 C) (Temporal)   Ht 5' 4.5" (1.638 m)   Wt 203 lb 2 oz (92.1 kg)   LMP 11/20/2015   SpO2 97%   BMI 34.33 kg/m   Wt Readings from Last 3 Encounters:  09/10/20 203 lb 2 oz (92.1 kg)  08/21/20 205 lb (93 kg)  04/04/20 204 lb 4 oz (92.6 kg)      Physical Exam Vitals and nursing note reviewed.  Constitutional:      General: She is not in acute distress.    Appearance: Normal appearance. She is well-developed. She is not ill-appearing.  HENT:     Head: Normocephalic and atraumatic.     Right Ear: Hearing, tympanic membrane, ear canal and external ear normal.     Left Ear: Hearing, tympanic membrane, ear canal and external ear normal.  Eyes:     General: No scleral icterus.    Extraocular Movements: Extraocular movements intact.     Conjunctiva/sclera: Conjunctivae normal.     Pupils: Pupils are equal, round, and reactive to light.  Neck:     Thyroid: No thyroid mass or thyromegaly.     Vascular: No carotid bruit.  Cardiovascular:     Rate and Rhythm: Normal rate and regular rhythm.     Pulses: Normal pulses.          Radial pulses are 2+ on the right side and 2+ on the left side.     Heart sounds: Normal heart sounds. No murmur heard.   Pulmonary:     Effort: Pulmonary effort is normal. No respiratory distress.     Breath sounds: Normal breath sounds. No wheezing, rhonchi or rales.  Abdominal:     General: Bowel sounds  are normal. There is no distension.     Palpations: Abdomen is soft. There is no mass.     Tenderness: There is no abdominal tenderness. There is no guarding or rebound.     Hernia: No hernia is present.  Musculoskeletal:        General: Normal range of motion.     Cervical back: Normal range of motion and neck supple.     Right lower leg: No edema.     Left lower leg: No edema.  Lymphadenopathy:     Cervical: No cervical adenopathy.  Skin:    General: Skin is warm and dry.     Findings: No rash.  Neurological:     General: No focal deficit present.     Mental Status: She is alert and oriented to person, place, and time.     Comments: CN grossly intact, station and gait intact  Psychiatric:        Mood and Affect: Mood normal.        Behavior: Behavior normal.        Thought Content: Thought content normal.        Judgment: Judgment normal.       Results for orders placed or performed in visit on 09/06/20  CBC with Differential/Platelet  Result Value Ref Range   WBC 4.9 4.0 - 10.5 K/uL   RBC 4.02 3.87 - 5.11 Mil/uL   Hemoglobin 12.3 12.0 - 15.0 g/dL   HCT 37.2 36.0 - 46.0 %   MCV 92.4 78.0 - 100.0 fl   MCHC 33.2 30.0 - 36.0 g/dL   RDW 13.6 11.5 - 15.5 %   Platelets 215.0 150.0 - 400.0 K/uL   Neutrophils Relative % 47.9 43.0 - 77.0 %   Lymphocytes Relative 40.2 12.0 - 46.0 %   Monocytes Relative 8.2 3.0 - 12.0 %   Eosinophils Relative 3.2 0.0 - 5.0 %   Basophils Relative 0.5 0.0 - 3.0 %   Neutro Abs 2.3 1.4 - 7.7 K/uL   Lymphs Abs 1.9 0.7 - 4.0 K/uL   Monocytes Absolute 0.4 0.1 - 1.0 K/uL   Eosinophils Absolute 0.2 0.0 - 0.7 K/uL   Basophils Absolute 0.0 0.0 - 0.1 K/uL  VITAMIN D 25 Hydroxy (Vit-D Deficiency, Fractures)  Result Value Ref Range   VITD 30.46 30.00 - 100.00 ng/mL  Comprehensive metabolic panel  Result Value Ref Range   Sodium 142 135 - 145 mEq/L   Potassium 3.8 3.5 - 5.1 mEq/L   Chloride 105 96 - 112 mEq/L   CO2 31 19 - 32 mEq/L   Glucose, Bld 98  70 - 99 mg/dL   BUN 15 6 - 23 mg/dL   Creatinine, Ser 1.10 0.40 - 1.20 mg/dL   Total Bilirubin 0.5 0.2 - 1.2 mg/dL   Alkaline Phosphatase 79 39 - 117 U/L   AST 14 0 - 37 U/L   ALT 16 0 - 35 U/L   Total Protein 7.5 6.0 - 8.3 g/dL   Albumin 4.0 3.5 - 5.2 g/dL   GFR 53.36 (L) >60.00 mL/min   Calcium 9.4 8.4 - 10.5 mg/dL  Lipid panel  Result Value Ref Range   Cholesterol 171 0 - 200 mg/dL   Triglycerides 141.0 0.0 - 149.0 mg/dL   HDL 44.60 >39.00 mg/dL   VLDL 28.2 0.0 - 40.0 mg/dL   LDL Cholesterol 99 0 - 99 mg/dL   Total CHOL/HDL Ratio 4    NonHDL 126.86   Microalbumin / creatinine urine ratio  Result Value Ref Range   Microalb, Ur 2.0 (H) 0.0 - 1.9 mg/dL   Creatinine,U 266.0 mg/dL   Microalb Creat Ratio 0.7 0.0 - 30.0 mg/g   Assessment & Plan:  This visit occurred during the SARS-CoV-2 public health emergency.  Safety protocols were in place, including screening questions prior to the visit, additional usage of staff PPE, and extensive cleaning of exam room while observing appropriate contact time as indicated for disinfecting solutions.   Problem List Items Addressed This Visit    Vitamin D deficiency    Stable period on vit D 1000 IU daily - although levels have dropped this year. Will continue to monitor.  Obesity, Class I, BMI 30-34.9    Encouraged healthy diet and lifestyle choices to affect sustainable weight loss.       Health maintenance examination - Primary    Preventative protocols reviewed and updated unless pt declined. Discussed healthy diet and lifestyle.       Essential hypertension    Chronic, stable. Continue current regimen.       Relevant Medications   amLODipine (NORVASC) 5 MG tablet   atorvastatin (LIPITOR) 20 MG tablet   HLD (hyperlipidemia)    Chronic, improved control on atorvastatin - continue. The 10-year ASCVD risk score [name]., et al., 2013) is: 9.6%   Values used to calculate the score:     Age: 68 years     Sex: Female      Is Non-Hispanic African American: Yes     Diabetic: No     Tobacco smoker: No     Systolic Blood Pressure: [id number] mmHg     Is BP treated: Yes     HDL Cholesterol: 44.6 mg/dL     Total Cholesterol: 171 mg/dL       Relevant Medications   amLODipine (NORVASC) 5 MG tablet   atorvastatin (LIPITOR) 20 MG tablet   Renal insufficiency    Encouraged good water intake, avoiding neprhotoxic agents.      Low back pain radiating to right lower extremity    Ongoing. Pending PT session next month.       Gastroesophageal reflux disease with esophagitis    Continue nexium 40mg  daily + pepcid nightly (started 03/2020).       Trochanteric bursitis of right hip    Continued discomfort.  Pending starting PT session next month.  She has started Instaflex OTC supplement with turmeric and collagen support.          Meds ordered this encounter  Medications  . amLODipine (NORVASC) 5 MG tablet    Sig: Take 1 tablet (5 mg total) by mouth daily.    Dispense:  90 tablet    Refill:  3  . atorvastatin (LIPITOR) 20 MG tablet    Sig: Take 1 tablet (20 mg total) by mouth daily.    Dispense:  90 tablet    Refill:  3  . esomeprazole (NEXIUM) 40 MG capsule    Sig: Take 1 capsule (40 mg total) by mouth daily.    Dispense:  90 capsule    Refill:  3  . famotidine (PEPCID) 20 MG tablet    Sig: Take 1 tablet (20 mg total) by mouth at bedtime.    Dispense:  90 tablet    Refill:  3  . gabapentin (NEURONTIN) 300 MG capsule    Sig: Take 1 capsule (300 mg total) by mouth at bedtime.    Dispense:  90 capsule    Refill:  3   No orders of the defined types were placed in this encounter.   Patient instructions: Call to reschedule mammogram.  Send [place] date of COVID shot.  Consider shingrix vaccine, let us know if interested.  Good to see you today Return as needed or in 1 year for next physical.   Follow up plan: Return in about 1 year (around 09/10/2021) for annual exam, prior fasting for blood  work.  Ria Bush, MD

## 2020-09-10 NOTE — Assessment & Plan Note (Addendum)
Continued discomfort.  Pending starting PT session next month.  She has started Instaflex OTC supplement with turmeric and collagen support.

## 2020-09-10 NOTE — Assessment & Plan Note (Signed)
Chronic, improved control on atorvastatin - continue. The 10-year ASCVD risk score [name]., et al., 2013) is: 9.6%   Values used to calculate the score:     Age: 65 years     Sex: Female     Is Non-Hispanic African American: Yes     Diabetic: No     Tobacco smoker: No     Systolic Blood Pressure: 147 mmHg     Is BP treated: Yes     HDL Cholesterol: 44.6 mg/dL     Total Cholesterol: 171 mg/dL

## 2020-10-22 ENCOUNTER — Other Ambulatory Visit: Payer: Self-pay | Admitting: Family Medicine

## 2020-10-24 NOTE — Telephone Encounter (Signed)
Rx sent 09/10/20, #90/3.

## 2020-10-30 ENCOUNTER — Other Ambulatory Visit: Payer: Self-pay

## 2020-10-30 ENCOUNTER — Encounter: Payer: Self-pay | Admitting: Family Medicine

## 2020-10-30 ENCOUNTER — Ambulatory Visit (INDEPENDENT_AMBULATORY_CARE_PROVIDER_SITE_OTHER)
Admission: RE | Admit: 2020-10-30 | Discharge: 2020-10-30 | Disposition: A | Discharge status: Home / Self Care | Payer: BC Managed Care – PPO | Source: Ambulatory Visit | Attending: Family Medicine | Admitting: Family Medicine

## 2020-10-30 ENCOUNTER — Ambulatory Visit: Payer: BC Managed Care – PPO | Admitting: Family Medicine

## 2020-10-30 VITALS — BP 134/76 | HR 77 | Temp 97.9°F | Ht 64.5 in | Wt 206.5 lb

## 2020-10-30 DIAGNOSIS — M533 Sacrococcygeal disorders, not elsewhere classified: Secondary | ICD-10-CM

## 2020-10-30 DIAGNOSIS — G8929 Other chronic pain: Secondary | ICD-10-CM | POA: Diagnosis not present

## 2020-10-30 DIAGNOSIS — M7061 Trochanteric bursitis, right hip: Secondary | ICD-10-CM | POA: Diagnosis not present

## 2020-10-30 NOTE — Progress Notes (Signed)
Patient ID: Martha Wade, female    DOB: Sep 04, 1955, 65 y.o.   MRN: [id number]  This visit was conducted in person.  BP 134/76   Pulse 77   Temp 97.9 F (36.6 C) (Temporal)   Ht 5' 4.5" (1.638 m)   Wt 206 lb 8 oz (93.7 kg)   LMP 11/20/2015   SpO2 98%   BMI 34.90 kg/m    CC: ongoing L hip pain  Subjective:   HPI: Martha Wade is a 65 y.o. female presenting on 10/30/2020 for Hip Pain (C/o continued L hip pain.  Wants to discuss other tx options. )   See prior notes for details.  Ongoing R lateral hip pain. Has seen sports medicine. Has tried and failed steroid injections x2 (02/2020, 03/2020), diclofenac 66m bid, gabapentin 3067mqhs, topical OTC creams, PT course ([facility]), even tried Instaflex advanced turmeric/collagen supplement (which has actually helped knee pains). She is currently using salon pas patch to lower back. Continues applying heat and uses massager to back. Has continued stretching and doing exercises.  Denies inciting trauma/injury or fall.   Symptoms most noticeable when she first gets up out of bed. Feels stiff first 20 min of the day, then back loosens up. Points to R lower back pain near sacroiliac joint as localizing site of pain. Feels heaviness to the right thigh. Does have some numbness radiate down right leg. Can feel relief when she's able to pop her lower back.   Found some benefit with PT however increased pain after therapy sessions. PT intake form reviewed.   R handed.  Xray of hip 2021 without significant arthritis.      Relevant past medical, surgical, family and social history reviewed and updated as indicated. Interim medical history since our last visit reviewed. Allergies and medications reviewed and updated. Outpatient Medications Prior to Visit  Medication Sig Dispense Refill   acetaminophen (TYLENOL) 500 MG tablet Take 1,000 mg by mouth daily.     amLODipine (NORVASC) 5 MG tablet Take 1 tablet (5 mg total) by  mouth daily. 90 tablet 3   atorvastatin (LIPITOR) 20 MG tablet Take 1 tablet (20 mg total) by mouth daily. 90 tablet 3   brimonidine (ALPHAGAN) 0.2 % ophthalmic solution      Cholecalciferol (VITAMIN D-3) 1000 UNITS CAPS Take 1,000 Units by mouth daily.     Coenzyme Q10 (COQ-10) 100 MG CAPS Take 100 mg by mouth daily.     cyanocobalamin 500 MCG tablet Take 500 mcg by mouth daily.      diclofenac Sodium (VOLTAREN) 1 % GEL Apply 2 g topically 3 (three) times daily. 100 g 1   dorzolamide (TRUSOPT) 2 % ophthalmic solution PLACE 1 DROP INTO BOTH EYES TWICE A DAY  5   esomeprazole (NEXIUM) 40 MG capsule Take 1 capsule (40 mg total) by mouth daily. 90 capsule 3   famotidine (PEPCID) 20 MG tablet Take 1 tablet (20 mg total) by mouth at bedtime. 90 tablet 3   gabapentin (NEURONTIN) 300 MG capsule Take 1 capsule (300 mg total) by mouth at bedtime. 90 capsule 3   latanoprost (XALATAN) 0.005 % ophthalmic solution   4   methocarbamol (ROBAXIN) 500 MG tablet TAKE 1 TABLET(500 MG) BY MOUTH TWICE DAILY AS NEEDED FOR MUSCLE SPASMS 30 tablet 3   MISC NATURAL PRODUCTS PO Take by mouth daily. Instaflex Advanced joint supplement     Omega-3 Fatty Acids (FISH OIL) 1000 MG CAPS Take 1 capsule by mouth daily.  ondansetron (ZOFRAN) 4 MG tablet Take 1 tablet (4 mg total) by mouth every 8 (eight) hours as needed for nausea or vomiting. 20 tablet 0   timolol (TIMOPTIC) 0.5 % ophthalmic solution   4   No facility-administered medications prior to visit.     Per HPI unless specifically indicated in ROS section below Review of Systems Objective:  BP 134/76   Pulse 77   Temp 97.9 F (36.6 C) (Temporal)   Ht 5' 4.5" (1.638 m)   Wt 206 lb 8 oz (93.7 kg)   LMP 11/20/2015   SpO2 98%   BMI 34.90 kg/m   Wt Readings from Last 3 Encounters:  10/30/20 206 lb 8 oz (93.7 kg)  09/10/20 203 lb 2 oz (92.1 kg)  08/21/20 205 lb (93 kg)      Physical Exam Vitals and nursing note reviewed.  Constitutional:       Appearance: Normal appearance. She is not ill-appearing.  Musculoskeletal:        General: Normal range of motion.     Comments:  No pain midline spine No paraspinous mm tenderness Discomfort with SLR on right. No significant pain with int/ext rotation at hip. + FABER on right. No pain at sciatic notch bilaterally.  Discomfort to right SIJ > GTB   Skin:    General: Skin is warm and dry.     Findings: No rash.  Neurological:     Mental Status: She is alert.  Psychiatric:        Mood and Affect: Mood normal.        Behavior: Behavior normal.      Lab Results  Component Value Date   CREATININE 1.10 09/06/2020   BUN 15 09/06/2020   NA 142 09/06/2020   K 3.8 09/06/2020   CL 105 09/06/2020   CO2 31 09/06/2020    DG Hip Unilat W OR W/O Pelvis 1V Right CLINICAL DATA:  Intermittent hip pain  EXAM: DG HIP (WITH OR WITHOUT PELVIS) 1V RIGHT  COMPARISON:  None.  FINDINGS: SI joints are patent. Pubic symphysis is intact. No fracture or malalignment. Minimal left greater than right degenerative change.  IMPRESSION: Minimal degenerative change. No acute osseous abnormality.  Electronically Signed   By: Donavan Foil M.D.   On: 04/04/2020 21:59   Assessment & Plan:  This visit occurred during the SARS-CoV-2 public health emergency.  Safety protocols were in place, including screening questions prior to the visit, additional usage of staff PPE, and extensive cleaning of exam room while observing appropriate contact time as indicated for disinfecting solutions.   Problem List Items Addressed This Visit     Trochanteric bursitis of right hip    Persistent but overall better.  May continue PT.  May continue Instaflex OTC supplement       Chronic right sacroiliac pain - Primary    Today's description of pain more consistent with sacroiliitis. She also endorses inflammatory arthritis symptoms of morning stiffness that improves as day progresses. Check labs to eval for AS and  check films of SI joint for baseline.  Further management recommendations pending results.        Relevant Orders   Sedimentation rate   ANA   HLA-B27 antigen   C-reactive protein   DG Si Joints     No orders of the defined types were placed in this encounter.  Orders Placed This Encounter  Procedures   DG Si Joints    Standing Status:   Future  Number of Occurrences:   1    Standing Expiration Date:   10/30/2021    Order Specific Question:   Reason for Exam (SYMPTOM  OR DIAGNOSIS REQUIRED)    Answer:   R SIJ pain for months to a year    Order Specific Question:   Preferred imaging location?    Answer:   [facility]   Sedimentation rate   ANA   HLA-B27 antigen   C-reactive protein     Patient instructions: Labs and xray today.  I am suspicious for sacroiliitis - check labs and xray to further evaluate for this. Pending results we will discuss treatment options from here. If SI joint related pain, you may benefit from a steroid injection into this joint.   Follow up plan: Return if symptoms worsen or fail to improve.  Ria Bush, MD

## 2020-10-30 NOTE — Assessment & Plan Note (Addendum)
Ongoing R lower back/hip pain since 01/2020. Today's description of pain more consistent with sacroiliitis. She also endorses inflammatory arthritis symptoms of morning stiffness that improves as day progresses. Check labs to eval for AS (HLA B27) and check films of SI joint for baseline.  Further management recommendations pending results.

## 2020-10-30 NOTE — Patient Instructions (Signed)
Labs and xray today.  I am suspicious for sacroiliitis - check labs and xray to further evaluate for this. Pending results we will discuss treatment options from here. If SI joint related pain, you may benefit from a steroid injection into this joint.   Sacroiliac Joint Dysfunction  Sacroiliac joint dysfunction is a condition that causes inflammation on one or both sides of the sacroiliac (SI) joint. The SI joint is the joint between two bones of the pelvis called the sacrum and the ilium. The sacrum is the bone at the base of the spine. The ilium is the large bone that forms the hip. This condition causes deep aching or burning pain in the low back. In some cases,the pain may also spread into one or both buttocks, hips, or thighs. What are the causes? This condition may be caused by: Pregnancy. During pregnancy, extra stress is put on the SI joints because the pelvis widens. Injury, such as: Injuries from car crashes. Sports-related injuries. Work-related injuries. Having one leg that is shorter than the other. Conditions that affect the joints, such as: Rheumatoid arthritis. Gout. Psoriatic arthritis. Joint infection (septic arthritis). Sometimes, the cause of SI joint dysfunction is not known. What are the signs or symptoms? Symptoms of this condition include: Aching or burning pain in the lower back. The pain may also spread to other areas, such as: Buttocks. Groin. Thighs. Muscle spasms in or around the painful areas. Increased pain when standing, walking, running, stair climbing, bending, or lifting. How is this diagnosed? This condition is diagnosed with a physical exam and your medical history. During the exam, the health care provider may move one or both of your legs to different positions to check for pain. Various tests may be done to confirm the diagnosis, including: Imaging tests to look for other causes of pain. These may include: MRI. CT scan. Bone scan. Diagnostic  injection. A numbing medicine is injected into the SI joint using a needle. If your pain is temporarily improved or stopped after the injection, this can indicate that SI joint dysfunction is the problem. How is this treated? Treatment depends on the cause and severity of your condition. Treatment options can be noninvasive and may include: Ice or heat applied to the lower back area after an injury. This may help reduce pain and muscle spasms. Medicines to relieve pain or inflammation or to relax the muscles. Wearing a back brace (sacroiliac brace) to help support the joint while your back is healing. Physical therapy to increase muscle strength around the joint and flexibility at the joint. This may also involve learning proper body positions and ways of moving to relieve stress on the joint. Direct manipulation of the SI joint. Use of a device that provides electrical stimulation to help reduce pain at the joint. Other treatments may include: Injections of steroid medicine into the joint to reduce pain and swelling. Radiofrequency ablation. This treatment uses heat to burn away nerves that are carrying pain messages from the joint. Surgery to put in screws and plates that limit or prevent joint motion. This is rare. Follow these instructions at home: Medicines Take over-the-counter and prescription medicines only as told by your health care provider. Ask your health care provider if the medicine prescribed to you: Requires you to avoid driving or using machinery. Can cause constipation. You may need to take these actions to prevent or treat constipation: Drink enough fluid to keep your urine pale yellow. Take over-the-counter or prescription medicines. Eat foods that  are high in fiber, such as beans, whole grains, and fresh fruits and vegetables. Limit foods that are high in fat and processed sugars, such as fried or sweet foods. If you have a brace: Wear the brace as told by your health  care provider. Remove it only as told by your health care provider. Keep the brace clean. If the brace is not waterproof: Do not let it get wet. Cover it with a watertight covering when you take a bath or a shower. Managing pain, stiffness, and swelling     Icing can help with pain and swelling. Heat may help with muscle tension or spasms. Ask your health care provider if you should use ice or heat. If directed, put ice on the affected area: If you have a removable brace, remove it as told by your health care provider. Put ice in a plastic bag. Place a towel between your skin and the bag. Leave the ice on for 20 minutes, 2-3 times a day. Remove the ice if your skin turns bright red. This is very important. If you cannot feel pain, heat, or cold, you have a greater risk of damage to the area. If directed, apply heat to the affected area as often as told by your health care provider. Use the heat source that your health care provider recommends, such as a moist heat pack or a heating pad. Place a towel between your skin and the heat source. Leave the heat on for 20-30 minutes. Remove the heat if your skin turns bright red. This is especially important if you are unable to feel pain, heat, or cold. You may have a greater risk of getting burned. General instructions Rest as needed. Return to your normal activities as told by your health care provider. Ask your health care provider what activities are safe for you. Do exercises as told by your health care provider or physical therapist. Keep all follow-up visits. This is important. Contact a health care provider if: Your pain is not controlled with medicine. You have a fever. Your pain is getting worse. Get help right away if: You have weakness, numbness, or tingling in your legs or feet. You lose control of your bladder or bowels. Summary Sacroiliac (SI) joint dysfunction is a condition that causes inflammation on one or both sides of the  SI joint. This condition causes deep aching or burning pain in the low back. In some cases, the pain may also spread into one or both buttocks, hips, or thighs. Treatment depends on the cause and severity of your condition. It may include medicines to reduce pain and swelling or to relax muscles. This information is not intended to replace advice given to you by your health care provider. Make sure you discuss any questions you have with your healthcare provider. Document Revised: 09/08/2019 Document Reviewed: 09/08/2019 Elsevier Patient Education  [employer].

## 2020-10-30 NOTE — Assessment & Plan Note (Addendum)
Persistent but overall better.  May continue PT.  May continue Instaflex OTC supplement

## 2020-10-31 LAB — C-REACTIVE PROTEIN: CRP: <1 mg/dL (ref 0.5–20.0)

## 2020-10-31 LAB — SEDIMENTATION RATE: Sed Rate: 38 mm/hr — ABNORMAL HIGH (ref 0–30)

## 2020-11-02 LAB — HLA-B27 ANTIGEN: HLA-B27 Antigen: NEGATIVE

## 2020-11-02 LAB — ANA: Anti Nuclear Antibody (ANA): POSITIVE — AB

## 2020-11-02 LAB — ANTI-NUCLEAR AB-TITER (ANA TITER): ANA Titer 1: 1:80 {titer} — ABNORMAL HIGH

## 2020-11-07 ENCOUNTER — Encounter: Payer: Self-pay | Admitting: Family Medicine

## 2020-11-07 DIAGNOSIS — G8929 Other chronic pain: Secondary | ICD-10-CM

## 2020-11-11 NOTE — Telephone Encounter (Signed)
Would like to refer patient to PM&R for consideration of SIJ injection.  [place] preference

## 2020-11-21 ENCOUNTER — Encounter: Payer: Self-pay | Admitting: Physical Medicine & Rehabilitation

## 2020-12-21 ENCOUNTER — Other Ambulatory Visit: Payer: Self-pay

## 2020-12-21 ENCOUNTER — Ambulatory Visit: Payer: BC Managed Care – PPO | Admitting: Family Medicine

## 2020-12-21 ENCOUNTER — Encounter: Payer: Self-pay | Admitting: Family Medicine

## 2020-12-21 DIAGNOSIS — M5412 Radiculopathy, cervical region: Secondary | ICD-10-CM | POA: Diagnosis not present
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
## 2021-01-22 ENCOUNTER — Other Ambulatory Visit: Payer: Self-pay | Admitting: Orthopedic Surgery

## 2021-01-22 DIAGNOSIS — M542 Cervicalgia: Secondary | ICD-10-CM

## 2021-01-29 ENCOUNTER — Encounter
Payer: BC Managed Care – PPO | Attending: Physical Medicine & Rehabilitation | Admitting: Physical Medicine & Rehabilitation

## 2021-01-29 ENCOUNTER — Other Ambulatory Visit: Payer: Self-pay

## 2021-01-29 ENCOUNTER — Encounter: Payer: Self-pay | Admitting: Physical Medicine & Rehabilitation

## 2021-01-29 VITALS — BP 124/82 | HR 80 | Temp 99.1°F | Ht 65.0 in | Wt 205.0 lb

## 2021-01-29 DIAGNOSIS — G8929 Other chronic pain: Secondary | ICD-10-CM | POA: Diagnosis not present

## 2021-01-29 DIAGNOSIS — M533 Sacrococcygeal disorders, not elsewhere classified: Secondary | ICD-10-CM | POA: Insufficient documentation

## 2021-01-29 MED ORDER — DIAZEPAM 10 MG PO TABS: 10.0000 mg | ORAL_TABLET | Freq: Once | ORAL | 0 refills | Status: AC
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
## 2021-01-29 NOTE — Patient Instructions (Signed)
Need a driver, take valium at least 55min prior to injection

## 2021-02-04 ENCOUNTER — Ambulatory Visit
Admission: RE | Admit: 2021-02-04 | Discharge: 2021-02-04 | Disposition: A | Discharge status: Home / Self Care | Payer: Self-pay | Source: Ambulatory Visit | Attending: Orthopedic Surgery | Admitting: Orthopedic Surgery

## 2021-02-04 ENCOUNTER — Other Ambulatory Visit: Payer: Self-pay

## 2021-02-04 DIAGNOSIS — M542 Cervicalgia: Secondary | ICD-10-CM

## 2021-02-06 ENCOUNTER — Other Ambulatory Visit: Payer: Self-pay | Admitting: Family Medicine
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
## 2021-03-26 ENCOUNTER — Encounter: Payer: Self-pay | Admitting: Physical Medicine & Rehabilitation

## 2021-03-26 ENCOUNTER — Other Ambulatory Visit: Payer: Self-pay

## 2021-03-26 ENCOUNTER — Encounter
Payer: BC Managed Care – PPO | Attending: Physical Medicine & Rehabilitation | Admitting: Physical Medicine & Rehabilitation

## 2021-03-26 VITALS — BP 131/87 | HR 79 | Temp 98.6°F | Ht 65.0 in | Wt 207.0 lb

## 2021-03-26 DIAGNOSIS — G8929 Other chronic pain: Secondary | ICD-10-CM | POA: Insufficient documentation

## 2021-03-26 DIAGNOSIS — M533 Sacrococcygeal disorders, not elsewhere classified: Secondary | ICD-10-CM | POA: Diagnosis present

## 2021-03-26 NOTE — Progress Notes (Signed)
  PROCEDURE RECORD [facility]   Name: Martha Wade DOB:October 15, 1955 MRN: [id number]  Date:03/26/2021  Physician: Alysia Penna, MD    Nurse/CMA: Jorja Loa MA  Allergies:  Allergies  Allergen Reactions   Aleve [Naproxen Sodium] Hives, Itching and Swelling   Citrus Swelling    Consent Signed: Yes.    Is patient diabetic? No.  CBG today? N/A  Pregnant: No. LMP: Patient's last menstrual period was 11/20/2015. (age 65-55)  Anticoagulants: no Anti-inflammatory: no Antibiotics: no  Procedure: Right Sacroiliac Steroid Injection  Position: Prone Start Time: 2:58 PM  End Time: 3:02 PM  Fluoro Time: 19  RN/CMA Evoleht Hovatter MA Eugean Arnott MA    Time 2:34 pm 3:06 pm    BP 131/87 147/85    Pulse 79 76    Respirations 16 16    O2 Sat 99 98    S/S 6 6    Pain Level 4/10 0/10     D/C home with Stanton Kidney (Sister), patient A & O X 3, D/C instructions reviewed, and sits independently.

## 2021-03-26 NOTE — Patient Instructions (Signed)
Sacroiliac injection was performed today. A combination of numbing medicine (lidocaine) plus a cortisone medicine (betamethasone) was injected. The injection was done under x-ray guidance. This procedure has been performed to help reduce low back and buttocks pain as well as potentially hip pain. The duration of this injection is variable lasting from hours to  Months. It may repeated if needed. 

## 2021-03-26 NOTE — Progress Notes (Signed)

## 2021-05-28 ENCOUNTER — Encounter: Payer: BC Managed Care – PPO | Admitting: Physical Medicine & Rehabilitation

## 2021-07-04 ENCOUNTER — Other Ambulatory Visit: Payer: Self-pay

## 2021-07-04 ENCOUNTER — Ambulatory Visit: Payer: BC Managed Care – PPO | Admitting: Family

## 2021-07-04 VITALS — BP 118/84 | HR 84 | Ht 65.0 in | Wt 210.0 lb

## 2021-07-04 DIAGNOSIS — M533 Sacrococcygeal disorders, not elsewhere classified: Secondary | ICD-10-CM | POA: Diagnosis not present

## 2021-07-04 DIAGNOSIS — M62838 Other muscle spasm: Secondary | ICD-10-CM | POA: Diagnosis not present

## 2021-07-04 DIAGNOSIS — G8929 Other chronic pain: Secondary | ICD-10-CM

## 2021-07-04 DIAGNOSIS — R079 Chest pain, unspecified: Secondary | ICD-10-CM

## 2021-07-04 DIAGNOSIS — M5412 Radiculopathy, cervical region: Secondary | ICD-10-CM

## 2021-07-04 DIAGNOSIS — K2100 Gastro-esophageal reflux disease with esophagitis, without bleeding: Secondary | ICD-10-CM

## 2021-07-04 MED ORDER — METHOCARBAMOL 500 MG PO TABS: ORAL_TABLET | ORAL | 0 refills | Status: DC

## 2021-07-04 MED ORDER — PANTOPRAZOLE SODIUM 20 MG PO TBEC: 20.0000 mg | DELAYED_RELEASE_TABLET | Freq: Every day | ORAL | 2 refills | Status: DC
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
## 2021-07-04 NOTE — Patient Instructions (Addendum)
We are changing your omeprazole to pantoprazole to see if this helps with chest pain that I feel may be associated with heartburn. If no improvement will consider cardiologist referral.   try to decrease and or avoid spicy foods, fried fatty foods, and also caffeine and chocolate as these can increase heartburn symptoms.   For your leg swelling, elevate your legs and wear compression stockings while at work.   It was a pleasure seeing you today! Please do not hesitate to reach out with any questions and or concerns.  Regards,   Eugenia Pancoast FNP-C

## 2021-07-04 NOTE — Assessment & Plan Note (Signed)
Try to decrease and or avoid spicy foods, fried fatty foods, and also caffeine and chocolate as these can increase heartburn symptoms.   

## 2021-07-04 NOTE — Assessment & Plan Note (Signed)
Robaxin refilled

## 2021-07-04 NOTE — Assessment & Plan Note (Addendum)
ekg today was normal sinus rhythm with no concerns. Fleeting chest pain that I feel is more associated with reflux symptoms.  Patient has been on omeprazole for some time so going to switch over to pantoprazole 20 mg once daily.  Did advise patient however if worsening crippling or sudden chest pain that is unrelenting patient to call emergency room and/or call 911.  We will consider cardiology in the future if this is persistent and not relieved with switch in PPI

## 2021-07-04 NOTE — Assessment & Plan Note (Signed)
Refill given for robaxin, heat/ice alternating.

## 2021-07-09 ENCOUNTER — Other Ambulatory Visit: Payer: Self-pay

## 2021-07-09 ENCOUNTER — Encounter
Payer: BC Managed Care – PPO | Attending: Physical Medicine & Rehabilitation | Admitting: Physical Medicine & Rehabilitation

## 2021-07-09 ENCOUNTER — Encounter: Payer: Self-pay | Admitting: Physical Medicine & Rehabilitation

## 2021-07-09 VITALS — BP 128/88 | HR 86 | Ht 65.0 in | Wt 209.0 lb

## 2021-07-09 DIAGNOSIS — M5441 Lumbago with sciatica, right side: Secondary | ICD-10-CM

## 2021-07-09 DIAGNOSIS — M5442 Lumbago with sciatica, left side: Secondary | ICD-10-CM

## 2021-07-09 NOTE — Progress Notes (Signed)
Subjective:    Patient ID: Martha Wade, female    DOB: [birth year], 66 y.o.   MRN: [id number]  HPI 66 year old female with chronic low back pain it has been mainly right-sided although left side of her low back is also bothering her.  She has pain for many years Pt states she was in [place] in 2022 which caused an exacerbation of her pain.  03/26/21 Right SI injection   Midline back pain above area of injection  Pain Inventory Average Pain 6 Pain Right Now 6 My pain is burning and dull  In the last 24 hours, has pain interfered with the following? General activity 10 Relation with others 10 Enjoyment of life 10 What TIME of day is your pain at its worst? evening Sleep (in general) Good  Pain is worse with: standing Pain improves with: pacing activities and injections Relief from Meds: 10  Family History  Problem Relation Age of Onset   Glaucoma Mother    CAD Mother    Diabetes Mother    Stroke Mother    Cancer Father        unsure type   Hypertension Brother        and sister   Social History   Socioeconomic History   Marital status: Married    Spouse name: Not on file   Number of children: Not on file   Years of education: Not on file   Highest education level: Not on file  Occupational History   Not on file  Tobacco Use   Smoking status: Never   Smokeless tobacco: Never  Vaping Use   Vaping Use: Never used  Substance and Sexual Activity   Alcohol use: No    Alcohol/week: 0.0 standard drinks   Drug use: No   Sexual activity: Not Currently  Other Topics Concern   Not on file  Social History Narrative   Lives with [name]   G0   Married   Occupation: dispatcher   Edu: college   Activity: no regular exercise, does have treadmill and weight lifting bench   Diet: increasing water, fruits/vegetables daily   Social Determinants of Health   Financial Resource Strain: Not on file  Food Insecurity: Not on file  Transportation Needs: Not on file   Physical Activity: Not on file  Stress: Not on file  Social Connections: Not on file   Past Surgical History:  Procedure Laterality Date   ANTERIOR CERVICAL DECOMP/DISCECTOMY FUSION N/A 06/08/2013   ANTERIOR CERVICAL DECOMPRESSION/DISCECTOMY FUSION 3 LEVELS;  Surgeon: Sinclair Ship, MD;  Anterior cervical decompression fusion with instrumentation and allograft (C4-7)   CATARACT EXTRACTION, BILATERAL  05/2017   CHOLECYSTECTOMY N/A 02/28/2016   Procedure: LAPAROSCOPIC CHOLECYSTECTOMY WITH INTRAOPERATIVE CHOLANGIOGRAM;  Surgeon: Robert Bellow, MD;  Location: [facility];  Service: General;  Laterality: N/A;   COLONOSCOPY  03/2013   int hem, 3 polyps no bx result available Vira Agar)   COLONOSCOPY WITH ESOPHAGOGASTRODUODENOSCOPY (EGD)  07/2018   colon - int hem, rpt 5 yrs, EGD - reflux esophagitis Vira Agar)   DEXA  11/2012   WNL   GLAUCOMA SURGERY Left    laser (Sun)   UMBILICAL HERNIA REPAIR N/A 02/28/2016   Procedure: PRIMARY REPAIR VENTRAL/ UMBILICAL ADULT;  Surgeon: Robert Bellow, MD;  Location: [facility];  Service: General;  Laterality: N/A;   Past Surgical History:  Procedure Laterality Date   ANTERIOR CERVICAL DECOMP/DISCECTOMY FUSION N/A 06/08/2013   ANTERIOR CERVICAL DECOMPRESSION/DISCECTOMY FUSION 3 LEVELS;  Surgeon: Elta Guadeloupe  Suzan Slick, MD;  Anterior cervical decompression fusion with instrumentation and allograft (C4-7)   CATARACT EXTRACTION, BILATERAL  05/2017   CHOLECYSTECTOMY N/A 02/28/2016   Procedure: LAPAROSCOPIC CHOLECYSTECTOMY WITH INTRAOPERATIVE CHOLANGIOGRAM;  Surgeon: Robert Bellow, MD;  Location: [facility];  Service: General;  Laterality: N/A;   COLONOSCOPY  03/2013   int hem, 3 polyps no bx result available Vira Agar)   COLONOSCOPY WITH ESOPHAGOGASTRODUODENOSCOPY (EGD)  07/2018   colon - int hem, rpt 5 yrs, EGD - reflux esophagitis Vira Agar)   DEXA  11/2012   WNL   GLAUCOMA SURGERY Left    laser (Sun)   UMBILICAL HERNIA REPAIR N/A 02/28/2016    Procedure: PRIMARY REPAIR VENTRAL/ UMBILICAL ADULT;  Surgeon: Robert Bellow, MD;  Location: [facility];  Service: General;  Laterality: N/A;   Past Medical History:  Diagnosis Date   Cervical radiculopathy    [place]   Chronic kidney disease    RENAL INSUFF   CTS (carpal tunnel syndrome)    bilateral   Glaucoma    early stages (Dr. Wallace Going and Dr. Gloriann Loan)   History of measles    History of mumps    Hypertension    Obesity (BMI 41.7-40.8)    Umbilical hernia 1448   s/p repair   BP 128/88    Pulse 86    Ht 5\' 5"  (1.651 m)    Wt 209 lb (94.8 kg)    LMP 11/20/2015    SpO2 98%    BMI 34.78 kg/m   Opioid Risk Score:   Fall Risk Score:  `1  Depression screen PHQ 2/9  Depression screen [facility] 2/9 07/09/2021 03/26/2021 01/29/2021 12/21/2020 09/10/2020 07/29/2019 10/16/2017  Decreased Interest 0 0 1 0 0 0 0  Down, Depressed, Hopeless 0 0 0 0 0 0 0  PHQ - 2 Score 0 0 1 0 0 0 0  Altered sleeping - - 1 - - - -  Tired, decreased energy - - 1 - - - -  Change in appetite - - 0 - - - -  Feeling bad or failure about yourself  - - 0 - - - -  Trouble concentrating - - 0 - - - -  Moving slowly or fidgety/restless - - 0 - - - -  Suicidal thoughts - - 0 - - - -  PHQ-9 Score - - 3 - - - -  Difficult doing work/chores - - Somewhat difficult - - - -     Review of Systems  Musculoskeletal:  Positive for back pain.  All other systems reviewed and are negative.     Objective:   Physical Exam Vitals and nursing note reviewed.  Constitutional:      Appearance: She is normal weight.  HENT:     Head: Normocephalic and atraumatic.  Eyes:     Extraocular Movements: Extraocular movements intact.     Conjunctiva/sclera: Conjunctivae normal.     Pupils: Pupils are equal, round, and reactive to light.  Skin:    General: Skin is warm and dry.  Neurological:     Mental Status: She is alert and oriented to person, place, and time.     Comments: Motor strength is 5/5 bilateral hip flexor knee extensor  ankle dorsiflexor Ambulates without assist device no evidence of toe drag or knee instability Normal lower extremity sensation  Psychiatric:        Mood and Affect: Mood normal.        Behavior: Behavior normal.   Patient has  pain with lumbar extension with limited range of motion lumbar flexion is without pain or limitations      Assessment & Plan:   1. Chronic low back pain this is separate than her prior sacroiliac pain which has improved.  This is more in the lumbar area.  We will check x-rays of the lumbar spine, will give her some back exercises to do.  We will see her back in a month to reeval

## 2021-07-09 NOTE — Patient Instructions (Signed)
Back Exercises These exercises help to make your trunk and back strong. They also help to keep the lower back flexible. Doing these exercises can help to prevent or lessen pain in your lower back. If you have back pain, try to do these exercises 2-3 times each day or as told by your doctor. As you get better, do the exercises once each day. Repeat the exercises more often as told by your doctor. To stop back pain from coming back, do the exercises once each day, or as told by your doctor. Do exercises exactly as told by your doctor. Stop right away if you feel sudden pain or your pain gets worse. Exercises Single knee to chest Do these steps 3-5 times in a row for each leg: Lie on your back on a firm bed or the floor with your legs stretched out. Bring one knee to your chest. Grab your knee or thigh with both hands and hold it in place. Pull on your knee until you feel a gentle stretch in your lower back or butt. Keep doing the stretch for 10-30 seconds. Slowly let go of your leg and straighten it. Pelvic tilt Do these steps 5-10 times in a row: Lie on your back on a firm bed or the floor with your legs stretched out. Bend your knees so they point up to the ceiling. Your feet should be flat on the floor. Tighten your lower belly (abdomen) muscles to press your lower back against the floor. This will make your tailbone point up to the ceiling instead of pointing down to your feet or the floor. Stay in this position for 5-10 seconds while you gently tighten your muscles and breathe evenly. Cat-cow Do these steps until your lower back bends more easily: Get on your hands and knees on a firm bed or the floor. Keep your hands under your shoulders, and keep your knees under your hips. You may put padding under your knees. Let your head hang down toward your chest. Tighten (contract) the muscles in your belly. Point your tailbone toward the floor so your lower back becomes rounded like the back of a  cat. Stay in this position for 5 seconds. Slowly lift your head. Let the muscles of your belly relax. Point your tailbone up toward the ceiling so your back forms a sagging arch like the back of a cow. Stay in this position for 5 seconds.  Press-ups Do these steps 5-10 times in a row: Lie on your belly (face-down) on a firm bed or the floor. Place your hands near your head, about shoulder-width apart. While you keep your back relaxed and keep your hips on the floor, slowly straighten your arms to raise the top half of your body and lift your shoulders. Do not use your back muscles. You may change where you place your hands to make yourself more comfortable. Stay in this position for 5 seconds. Keep your back relaxed. Slowly return to lying flat on the floor.  Bridges Do these steps 10 times in a row: Lie on your back on a firm bed or the floor. Bend your knees so they point up to the ceiling. Your feet should be flat on the floor. Your arms should be flat at your sides, next to your body. Tighten your butt muscles and lift your butt off the floor until your waist is almost as high as your knees. If you do not feel the muscles working in your butt and the back of  your thighs, slide your feet 1-2 inches (2.5-5 cm) farther away from your butt. Stay in this position for 3-5 seconds. Slowly lower your butt to the floor, and let your butt muscles relax. If this exercise is too easy, try doing it with your arms crossed over your chest. Belly crunches Do these steps 5-10 times in a row: Lie on your back on a firm bed or the floor with your legs stretched out. Bend your knees so they point up to the ceiling. Your feet should be flat on the floor. Cross your arms over your chest. Tip your chin a little bit toward your chest, but do not bend your neck. Tighten your belly muscles and slowly raise your chest just enough to lift your shoulder blades a tiny bit off the floor. Avoid raising your body  higher than that because it can put too much stress on your lower back. Slowly lower your chest and your head to the floor. Back lifts Do these steps 5-10 times in a row: Lie on your belly (face-down) with your arms at your sides, and rest your forehead on the floor. Tighten the muscles in your legs and your butt. Slowly lift your chest off the floor while you keep your hips on the floor. Keep the back of your head in line with the curve in your back. Look at the floor while you do this. Stay in this position for 3-5 seconds. Slowly lower your chest and your face to the floor. Contact a doctor if: Your back pain gets a lot worse when you do an exercise. Your back pain does not get better within 2 hours after you exercise. If you have any of these problems, stop doing the exercises. Do not do them again unless your doctor says it is okay. Get help right away if: You have sudden, very bad back pain. If this happens, stop doing the exercises. Do not do them again unless your doctor says it is okay. This information is not intended to replace advice given to you by your health care provider. Make sure you discuss any questions you have with your health care provider. Document Revised: 07/11/2020 Document Reviewed: 07/11/2020 Elsevier Patient Education  [place].

## 2021-08-01 ENCOUNTER — Telehealth: Payer: Self-pay

## 2021-08-01 NOTE — Telephone Encounter (Signed)
Spoke with pt informing her that her cpe is due in 09/2021 but he husbands is not due until after 02/12/22.  Offered to schedule them both in Oct but pt decided they will each stay on their current schedules.  Pt schedule her cpe on 09/17/21 at 4:00 and labs prior on 09/10/21 at 7:45.  Pt will call back to schedule husband's cpe/labs. ?

## 2021-08-01 NOTE — Telephone Encounter (Signed)
Pt called to schedule CPE and MCW for her and her husband back to back. I looked out in to May and did not see anything. Will forward to [place] to see if she can get with Dr Darnell Level to accommodate them.  ?

## 2021-08-09 ENCOUNTER — Encounter: Payer: BC Managed Care – PPO | Admitting: Physical Medicine & Rehabilitation

## 2021-08-23 ENCOUNTER — Encounter: Payer: BC Managed Care – PPO | Admitting: Physical Medicine & Rehabilitation

## 2021-08-31 ENCOUNTER — Other Ambulatory Visit: Payer: Self-pay | Admitting: Family Medicine

## 2021-08-31 DIAGNOSIS — E559 Vitamin D deficiency, unspecified: Secondary | ICD-10-CM

## 2021-08-31 DIAGNOSIS — E785 Hyperlipidemia, unspecified: Secondary | ICD-10-CM

## 2021-08-31 DIAGNOSIS — N289 Disorder of kidney and ureter, unspecified: Secondary | ICD-10-CM

## 2021-09-06 ENCOUNTER — Telehealth: Payer: Self-pay

## 2021-09-06 MED ORDER — FAMOTIDINE 20 MG PO TABS: 20.0000 mg | ORAL_TABLET | Freq: Every day | ORAL | 0 refills | Status: DC

## 2021-09-06 NOTE — Telephone Encounter (Signed)
Patient has follow up 09/17/2021 ?Will be out of refills on 09/10/2021. ?Sent in 90 day supply  ?

## 2021-09-10 ENCOUNTER — Other Ambulatory Visit (INDEPENDENT_AMBULATORY_CARE_PROVIDER_SITE_OTHER): Payer: BC Managed Care – PPO

## 2021-09-10 DIAGNOSIS — E559 Vitamin D deficiency, unspecified: Secondary | ICD-10-CM

## 2021-09-10 DIAGNOSIS — N289 Disorder of kidney and ureter, unspecified: Secondary | ICD-10-CM | POA: Diagnosis not present

## 2021-09-10 DIAGNOSIS — E785 Hyperlipidemia, unspecified: Secondary | ICD-10-CM | POA: Diagnosis not present

## 2021-09-10 LAB — COMPREHENSIVE METABOLIC PANEL
ALT: 14 U/L (ref 0–35)
AST: 14 U/L (ref 0–37)
Albumin: 4.2 g/dL (ref 3.5–5.2)
Alkaline Phosphatase: 78 U/L (ref 39–117)
BUN: 10 mg/dL (ref 6–23)
CO2: 32 mEq/L (ref 19–32)
Calcium: 9.6 mg/dL (ref 8.4–10.5)
Chloride: 102 mEq/L (ref 96–112)
Creatinine, Ser: 1.21 mg/dL — ABNORMAL HIGH (ref 0.40–1.20)
GFR: 47.26 mL/min — ABNORMAL LOW (ref >60.00)
Glucose, Bld: 100 mg/dL — ABNORMAL HIGH (ref 70–99)
Potassium: 3.5 mEq/L (ref 3.5–5.1)
Sodium: 140 mEq/L (ref 135–145)
Total Bilirubin: 0.5 mg/dL (ref 0.2–1.2)
Total Protein: 7.7 g/dL (ref 6.0–8.3)

## 2021-09-10 LAB — LIPID PANEL
Cholesterol: 196 mg/dL (ref 0–200)
HDL: 47.1 mg/dL (ref >39.00)
LDL Cholesterol: 118 mg/dL — ABNORMAL HIGH (ref 0–99)
NonHDL: 148.52
Total CHOL/HDL Ratio: 4
Triglycerides: 153 mg/dL — ABNORMAL HIGH (ref 0.0–149.0)
VLDL: 30.6 mg/dL (ref 0.0–40.0)

## 2021-09-10 LAB — CBC WITH DIFFERENTIAL/PLATELET
Basophils Absolute: 0 10*3/uL (ref 0.0–0.1)
Basophils Relative: 0.7 % (ref 0.0–3.0)
Eosinophils Absolute: 0.2 10*3/uL (ref 0.0–0.7)
Eosinophils Relative: 3.5 % (ref 0.0–5.0)
HCT: 37.9 % (ref 36.0–46.0)
Hemoglobin: 12.6 g/dL (ref 12.0–15.0)
Lymphocytes Relative: 41.4 % (ref 12.0–46.0)
Lymphs Abs: 2.3 10*3/uL (ref 0.7–4.0)
MCHC: 33.3 g/dL (ref 30.0–36.0)
MCV: 92.2 fl (ref 78.0–100.0)
Monocytes Absolute: 0.5 10*3/uL (ref 0.1–1.0)
Monocytes Relative: 9.3 % (ref 3.0–12.0)
Neutro Abs: 2.5 10*3/uL (ref 1.4–7.7)
Neutrophils Relative %: 45.1 % (ref 43.0–77.0)
Platelets: 205 10*3/uL (ref 150.0–400.0)
RBC: 4.11 Mil/uL (ref 3.87–5.11)
RDW: 13.4 % (ref 11.5–15.5)
WBC: 5.6 10*3/uL (ref 4.0–10.5)

## 2021-09-10 LAB — MICROALBUMIN / CREATININE URINE RATIO
Creatinine,U: 55.1 mg/dL
Microalb Creat Ratio: 1.3 mg/g (ref 0.0–30.0)
Microalb, Ur: <0.7 mg/dL (ref 0.0–1.9)

## 2021-09-10 LAB — VITAMIN D 25 HYDROXY (VIT D DEFICIENCY, FRACTURES): VITD: 38.77 ng/mL (ref 30.00–100.00)

## 2021-09-17 ENCOUNTER — Encounter: Payer: Self-pay | Admitting: Family Medicine

## 2021-09-17 ENCOUNTER — Ambulatory Visit (INDEPENDENT_AMBULATORY_CARE_PROVIDER_SITE_OTHER): Payer: BC Managed Care – PPO | Admitting: Family Medicine

## 2021-09-17 VITALS — BP 130/74 | HR 81 | Temp 97.9°F | Ht 64.25 in | Wt 209.0 lb

## 2021-09-17 DIAGNOSIS — M62838 Other muscle spasm: Secondary | ICD-10-CM

## 2021-09-17 DIAGNOSIS — E559 Vitamin D deficiency, unspecified: Secondary | ICD-10-CM | POA: Diagnosis not present

## 2021-09-17 DIAGNOSIS — Z0000 Encounter for general adult medical examination without abnormal findings: Secondary | ICD-10-CM

## 2021-09-17 DIAGNOSIS — E785 Hyperlipidemia, unspecified: Secondary | ICD-10-CM

## 2021-09-17 DIAGNOSIS — E669 Obesity, unspecified: Secondary | ICD-10-CM

## 2021-09-17 DIAGNOSIS — I10 Essential (primary) hypertension: Secondary | ICD-10-CM

## 2021-09-17 DIAGNOSIS — N289 Disorder of kidney and ureter, unspecified: Secondary | ICD-10-CM

## 2021-09-17 DIAGNOSIS — K2100 Gastro-esophageal reflux disease with esophagitis, without bleeding: Secondary | ICD-10-CM

## 2021-09-17 MED ORDER — GABAPENTIN 300 MG PO CAPS: 300.0000 mg | ORAL_CAPSULE | Freq: Every day | ORAL | 3 refills | Status: DC

## 2021-09-17 MED ORDER — ATORVASTATIN CALCIUM 20 MG PO TABS: 20.0000 mg | ORAL_TABLET | Freq: Every day | ORAL | 3 refills | Status: DC

## 2021-09-17 MED ORDER — AMLODIPINE BESYLATE 5 MG PO TABS: 5.0000 mg | ORAL_TABLET | Freq: Every day | ORAL | 3 refills | Status: DC

## 2021-09-17 MED ORDER — FAMOTIDINE 20 MG PO TABS: 20.0000 mg | ORAL_TABLET | Freq: Every day | ORAL | 3 refills | Status: DC

## 2021-09-17 MED ORDER — METHOCARBAMOL 500 MG PO TABS: 500.0000 mg | ORAL_TABLET | Freq: Two times a day (BID) | ORAL | 3 refills | Status: DC | PRN

## 2021-09-17 NOTE — Assessment & Plan Note (Signed)
Continue vitamin D 1000 units daily. 

## 2021-09-17 NOTE — Patient Instructions (Addendum)
Call GYN to schedule well woman exam as you're due [phone] ?Call to schedule mammogram at your convenience: [facility] [phone] ?Consider shingrix vaccine.  ?Kidney function returned a bit impaired - schedule lab visit in 4-6 months to recheck kidney function.  ?Return as needed or in 1 year for next physical.  ? ?Health Maintenance for Postmenopausal Women ?Menopause is a normal process in which your ability to get pregnant comes to an end. This process happens slowly over many months or years, usually between the ages of 51 and 59. Menopause is complete when you have missed your menstrual period for 12 months. ?It is important to talk with your health care provider about some of the most common conditions that affect women after menopause (postmenopausal women). These include heart disease, cancer, and bone loss (osteoporosis). Adopting a healthy lifestyle and getting preventive care can help to promote your health and wellness. The actions you take can also lower your chances of developing some of these common conditions. ?What are the signs and symptoms of menopause? ?During menopause, you may have the following symptoms: ?Hot flashes. These can be moderate or severe. ?Night sweats. ?Decrease in sex drive. ?Mood swings. ?Headaches. ?Tiredness (fatigue). ?Irritability. ?Memory problems. ?Problems falling asleep or staying asleep. ?Talk with your health care provider about treatment options for your symptoms. ?Do I need hormone replacement therapy? ?Hormone replacement therapy is effective in treating symptoms that are caused by menopause, such as hot flashes and night sweats. ?Hormone replacement carries certain risks, especially as you become older. If you are thinking about using estrogen or estrogen with progestin, discuss the benefits and risks with your health care provider. ?How can I reduce my risk for heart disease and stroke? ?The risk of heart disease, heart attack, and  stroke increases as you age. One of the causes may be a change in the body's hormones during menopause. This can affect how your body uses dietary fats, triglycerides, and cholesterol. Heart attack and stroke are medical emergencies. There are many things that you can do to help prevent heart disease and stroke. ?Watch your blood pressure ?High blood pressure causes heart disease and increases the risk of stroke. This is more likely to develop in people who have high blood pressure readings or are overweight. ?Have your blood pressure checked: ?Every 3-5 years if you are 56-48 years of age. ?Every year if you are 24 years old or older. ?Eat a healthy diet ? ?Eat a diet that includes plenty of vegetables, fruits, low-fat dairy products, and lean protein. ?Do not eat a lot of foods that are high in solid fats, added sugars, or sodium. ?Get regular exercise ?Get regular exercise. This is one of the most important things you can do for your health. Most adults should: ?Try to exercise for at least 150 minutes each week. The exercise should increase your heart rate and make you sweat (moderate-intensity exercise). ?Try to do strengthening exercises at least twice each week. Do these in addition to the moderate-intensity exercise. ?Spend less time sitting. Even light physical activity can be beneficial. ?Other tips ?Work with your health care provider to achieve or maintain a healthy weight. ?Do not use any products that contain nicotine or tobacco. These products include cigarettes, chewing tobacco, and vaping devices, such as e-cigarettes. If you need help quitting, ask your health care provider. ?Know your numbers. Ask your health care provider to check your cholesterol and your blood sugar (glucose). Continue to have your  blood tested as directed by your health care provider. ?Do I need screening for cancer? ?Depending on your health history and family history, you may need to have cancer screenings at different  stages of your life. This may include screening for: ?Breast cancer. ?Cervical cancer. ?Lung cancer. ?Colorectal cancer. ?What is my risk for osteoporosis? ?After menopause, you may be at increased risk for osteoporosis. Osteoporosis is a condition in which bone destruction happens more quickly than new bone creation. To help prevent osteoporosis or the bone fractures that can happen because of osteoporosis, you may take the following actions: ?If you are 32-33 years old, get at least 1,000 mg of calcium and at least 600 international units (IU) of vitamin D per day. ?If you are older than age 17 but younger than age 45, get at least 1,200 mg of calcium and at least 600 international units (IU) of vitamin D per day. ?If you are older than age 3, get at least 1,200 mg of calcium and at least 800 international units (IU) of vitamin D per day. ?Smoking and drinking excessive alcohol increase the risk of osteoporosis. Eat foods that are rich in calcium and vitamin D, and do weight-bearing exercises several times each week as directed by your health care provider. ?How does menopause affect my mental health? ?Depression may occur at any age, but it is more common as you become older. Common symptoms of depression include: ?Feeling depressed. ?Changes in sleep patterns. ?Changes in appetite or eating patterns. ?Feeling an overall lack of motivation or enjoyment of activities that you previously enjoyed. ?Frequent crying spells. ?Talk with your health care provider if you think that you are experiencing any of these symptoms. ?General instructions ?See your health care provider for regular wellness exams and vaccines. This may include: ?Scheduling regular health, dental, and eye exams. ?Getting and maintaining your vaccines. These include: ?Influenza vaccine. Get this vaccine each year before the flu season begins. ?Pneumonia vaccine. ?Shingles vaccine. ?Tetanus, diphtheria, and pertussis (Tdap) booster vaccine. ?Your  health care provider may also recommend other immunizations. ?Tell your health care provider if you have ever been abused or do not feel safe at home. ?Summary ?Menopause is a normal process in which your ability to get pregnant comes to an end. ?This condition causes hot flashes, night sweats, decreased interest in sex, mood swings, headaches, or lack of sleep. ?Treatment for this condition may include hormone replacement therapy. ?Take actions to keep yourself healthy, including exercising regularly, eating a healthy diet, watching your weight, and checking your blood pressure and blood sugar levels. ?Get screened for cancer and depression. Make sure that you are up to date with all your vaccines. ?This information is not intended to replace advice given to you by your health care provider. Make sure you discuss any questions you have with your health care provider. ?Document Revised: 09/17/2020 Document Reviewed: 09/17/2020 ?Elsevier Patient Education ? [place]. ? ?

## 2021-09-17 NOTE — Assessment & Plan Note (Signed)
Chronic, deteriorated.  She has missed cholesterol medication recently but is making an effort to take regularly. ?The 10-year ASCVD risk score (Arnett DK, et al., 2019) is: 9.2% ?  Values used to calculate the score: ?    Age: 66 years ?    Sex: Female ?    Is Non-Hispanic African American: Yes ?    Diabetic: No ?    Tobacco smoker: No ?    Systolic Blood Pressure: 580 mmHg ?    Is BP treated: Yes ?    HDL Cholesterol: 47.1 mg/dL ?    Total Cholesterol: 196 mg/dL ?

## 2021-09-17 NOTE — Assessment & Plan Note (Signed)
Continues pantoprazole 20 mg daily as well as Pepcid 20 mg at night.  Omeprazole 40 mg previously ineffective.  Monitor renal function on regular PPI use. ?

## 2021-09-17 NOTE — Assessment & Plan Note (Signed)
Preventative protocols reviewed and updated unless pt declined. Discussed healthy diet and lifestyle.  

## 2021-09-17 NOTE — Progress Notes (Signed)
? ? Patient ID: Martha Wade, female    DOB: 1955/05/30, 66 y.o.   MRN: [id number] ? ?This visit was conducted in person. ? ?BP 130/74   Pulse 81   Temp 97.9 ?F (36.6 ?C) (Temporal)   Ht 5' 4.25" (1.632 m)   Wt 209 lb (94.8 kg)   LMP 11/20/2015   SpO2 96%   BMI 35.60 kg/m?   ? ?CC: CPE ?Subjective:  ? ?HPI: ?Linette Gunderson is a 66 y.o. female presenting on 09/17/2021 for Annual Exam ? ? ?Continues caring for siblings - brother with Alzheimer's on weekends, busy with work on week days. Other brother currently hospitalized with possible stroke.  ? ?Had missed some cholesterol meds recently - she's since started taking daily. ?  ?Continues seeing Dr Letta Pate for lower back pain.  ? ?May be interested in bariatric medication.  ?She's reached out to [facility].  ? ?Preventative: ?COLONOSCOPY WITH ESOPHAGOGASTRODUODENOSCOPY (EGD) 07/2018 - colon - int hem, rpt 5 yrs, EGD - reflux esophagitis Vira Agar) ?Well woman - last pap 09/2015 - WNL except endocervical zone absent. She sees [facility], last seen 2019 for PMB, due for f/u.  ?Mammogram - 07/2019 Birads1 @ [facility]  ?DEXA 11/2012 - WNL  ?Lung cancer screening - not eligible  ?Flu shot - declines  ?[place] 07/2019, 08/2019, booster 02/2020 ?Tdap 12/2012  ?Shingrix - discussed  ?Seat belt use discussed ?Sunscreen use discussed, no changing moles on skin.  ?Sleep - averaging 6 hours/night ?Non smoker - husband smokes  ?Alcohol - none  ?Dentist - Dr Nicola Girt yearly ?Eye exam - Dr Gloriann Loan - glaucoma s/p surgery ? 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   ? ?Relevant past medical, surgical, family and social history reviewed and updated as indicated. Interim medical history since our last visit reviewed. ?Allergies and medications  reviewed and updated. ?Outpatient Medications Prior to Visit  ?Medication Sig Dispense Refill  ? acetaminophen (TYLENOL) 500 MG tablet Take 1,000 mg by mouth daily.    ? brimonidine (ALPHAGAN) 0.2 % ophthalmic solution     ? Cholecalciferol (VITAMIN D-3) 1000 UNITS CAPS Take 1,000 Units by mouth daily.    ? Coenzyme Q10 (COQ-10) 100 MG CAPS Take 100 mg by mouth daily.    ? cyanocobalamin 500 MCG tablet Take 500 mcg by mouth daily.     ? dorzolamide (TRUSOPT) 2 % ophthalmic solution PLACE 1 DROP INTO BOTH EYES TWICE A DAY  5  ? latanoprost (XALATAN) 0.005 % ophthalmic solution   4  ? Omega-3 Fatty Acids (FISH OIL) 1000 MG CAPS Take 1 capsule by mouth daily.    ? pantoprazole (PROTONIX) 20 MG tablet Take 1 tablet (20 mg total) by mouth daily. 30 tablet 2  ? RHOPRESSA 0.02 % SOLN Apply 1 drop to eye daily.    ? timolol (TIMOPTIC) 0.5 % ophthalmic solution   4  ? amLODipine (NORVASC) 5 MG tablet Take 1 tablet (5 mg total) by mouth daily. 90 tablet 3  ? atorvastatin (LIPITOR) 20 MG tablet Take 1 tablet (20 mg total) by mouth daily. 90 tablet 3  ? famotidine (PEPCID) 20 MG tablet Take 1 tablet (20 mg total) by mouth at bedtime. 90 tablet 0  ? gabapentin (NEURONTIN) 300 MG capsule TAKE 1 CAPSULE(300 MG) BY MOUTH AT BEDTIME 90 capsule 3  ?  methocarbamol (ROBAXIN) 500 MG tablet TAKE 1 TABLET(500 MG) BY MOUTH TWICE DAILY AS NEEDED FOR MUSCLE SPASMS ?Strength: 500 mg 30 tablet 0  ? MISC NATURAL PRODUCTS PO Take by mouth daily. Instaflex Advanced joint supplement    ? ?No facility-administered medications prior to visit.  ?  ? ?Per HPI unless specifically indicated in ROS section below ?Review of Systems  ?Constitutional:  Negative for activity change, appetite change, chills, fatigue, fever and unexpected weight change.  ?HENT:  Negative for hearing loss.   ?Eyes:  Negative for visual disturbance.  ?Respiratory:  Negative for cough, chest tightness, shortness of breath and wheezing.   ?Cardiovascular:  Negative for chest pain,  palpitations and leg swelling.  ?Gastrointestinal:  Negative for abdominal distention, abdominal pain, blood in stool, constipation, diarrhea, nausea and vomiting.  ?Genitourinary:  Negative for difficulty urinating and hematuria.  ?Musculoskeletal:  Negative for arthralgias, myalgias and neck pain.  ?Skin:  Negative for rash.  ?Neurological:  Negative for dizziness, seizures, syncope and headaches.  ?Hematological:  Negative for adenopathy. Does not bruise/bleed easily.  ?Psychiatric/Behavioral:  Negative for dysphoric mood. The patient is not nervous/anxious.   ? ?Objective:  ?BP 130/74   Pulse 81   Temp 97.9 ?F (36.6 ?C) (Temporal)   Ht 5' 4.25" (1.632 m)   Wt 209 lb (94.8 kg)   LMP 11/20/2015   SpO2 96%   BMI 35.60 kg/m?   ?Wt Readings from Last 3 Encounters:  ?09/17/21 209 lb (94.8 kg)  ?07/09/21 209 lb (94.8 kg)  ?07/04/21 210 lb (95.3 kg)  ?  ?  ?Physical Exam ?Vitals and nursing note reviewed.  ?Constitutional:   ?   Appearance: Normal appearance. She is not ill-appearing.  ?HENT:  ?   Head: Normocephalic and atraumatic.  ?   Right Ear: Tympanic membrane, ear canal and external ear normal. There is no impacted cerumen.  ?   Left Ear: Tympanic membrane, ear canal and external ear normal. There is no impacted cerumen.  ?Eyes:  ?   General:     ?   Right eye: No discharge.     ?   Left eye: No discharge.  ?   Extraocular Movements: Extraocular movements intact.  ?   Conjunctiva/sclera: Conjunctivae normal.  ?   Pupils: Pupils are equal, round, and reactive to light.  ?Neck:  ?   Thyroid: No thyroid mass or thyromegaly.  ?Cardiovascular:  ?   Rate and Rhythm: Normal rate and regular rhythm.  ?   Pulses: Normal pulses.  ?   Heart sounds: Normal heart sounds. No murmur heard. ?Pulmonary:  ?   Effort: Pulmonary effort is normal. No respiratory distress.  ?   Breath sounds: Normal breath sounds. No wheezing, rhonchi or rales.  ?Abdominal:  ?   General: Bowel sounds are normal. There is no distension.  ?    Palpations: Abdomen is soft. There is no mass.  ?   Tenderness: There is no abdominal tenderness. There is no guarding or rebound.  ?   Hernia: No hernia is present.  ?Musculoskeletal:  ?   Cervical back: Normal range of motion and neck supple. No rigidity.  ?   Right lower leg: No edema.  ?   Left lower leg: No edema.  ?Lymphadenopathy:  ?   Cervical: No cervical adenopathy.  ?Skin: ?   General: Skin is warm and dry.  ?   Findings: No rash.  ?Neurological:  ?   General: No focal deficit present.  ?  Mental Status: She is alert. Mental status is at baseline.  ?Psychiatric:     ?   Mood and Affect: Mood normal.     ?   Behavior: Behavior normal.  ? ?   ?Results for orders placed or performed in visit on 09/10/21  ?Microalbumin / creatinine urine ratio  ?Result Value Ref Range  ? Microalb, Ur <0.7 0.0 - 1.9 mg/dL  ? Creatinine,U 55.1 mg/dL  ? Microalb Creat Ratio 1.3 0.0 - 30.0 mg/g  ?VITAMIN D 25 Hydroxy (Vit-D Deficiency, Fractures)  ?Result Value Ref Range  ? VITD 38.77 30.00 - 100.00 ng/mL  ?CBC with Differential/Platelet  ?Result Value Ref Range  ? WBC 5.6 4.0 - 10.5 K/uL  ? RBC 4.11 3.87 - 5.11 Mil/uL  ? Hemoglobin 12.6 12.0 - 15.0 g/dL  ? HCT 37.9 36.0 - 46.0 %  ? MCV 92.2 78.0 - 100.0 fl  ? MCHC 33.3 30.0 - 36.0 g/dL  ? RDW 13.4 11.5 - 15.5 %  ? Platelets 205.0 150.0 - 400.0 K/uL  ? Neutrophils Relative % 45.1 43.0 - 77.0 %  ? Lymphocytes Relative 41.4 12.0 - 46.0 %  ? Monocytes Relative 9.3 3.0 - 12.0 %  ? Eosinophils Relative 3.5 0.0 - 5.0 %  ? Basophils Relative 0.7 0.0 - 3.0 %  ? Neutro Abs 2.5 1.4 - 7.7 K/uL  ? Lymphs Abs 2.3 0.7 - 4.0 K/uL  ? Monocytes Absolute 0.5 0.1 - 1.0 K/uL  ? Eosinophils Absolute 0.2 0.0 - 0.7 K/uL  ? Basophils Absolute 0.0 0.0 - 0.1 K/uL  ?Comprehensive metabolic panel  ?Result Value Ref Range  ? Sodium 140 135 - 145 mEq/L  ? Potassium 3.5 3.5 - 5.1 mEq/L  ? Chloride 102 96 - 112 mEq/L  ? CO2 32 19 - 32 mEq/L  ? Glucose, Bld 100 (H) 70 - 99 mg/dL  ? BUN 10 6 - 23 mg/dL  ?  Creatinine, Ser 1.21 (H) 0.40 - 1.20 mg/dL  ? Total Bilirubin 0.5 0.2 - 1.2 mg/dL  ? Alkaline Phosphatase 78 39 - 117 U/L  ? AST 14 0 - 37 U/L  ? ALT 14 0 - 35 U/L  ? Total Protein 7.7 6.0 - 8.3 g/dL  ? Albumin 4.2 3.5 - 5.2

## 2021-09-17 NOTE — Assessment & Plan Note (Signed)
Chronic, stable on current medical regimen.  Continue amlodipine 5 mg daily- monitoring ankle edema. ?

## 2021-09-17 NOTE — Assessment & Plan Note (Signed)
Encouraged healthy diet and lifestyle options to affect stainable weight loss.  She may be interested in bariatric medication and has reached out to bariatric clinic ([facility]). ?Discussed if interested may return to discuss bariatric medication with me, however understands we are often limited by insurance coverage and affordability of these medications. ?

## 2021-09-17 NOTE — Assessment & Plan Note (Addendum)
GFR down to 47 today.  Discussed importance of good hydration status and limiting NSAID use which she already does.  Did ask her to return in 4 to 6 weeks for lab visit only to recheck kidney function.  If persistent deterioration noted, low threshold to check renal ultrasound.  Microalbumin normal today. ?

## 2021-10-02 ENCOUNTER — Other Ambulatory Visit: Payer: Self-pay

## 2021-10-02 DIAGNOSIS — K2100 Gastro-esophageal reflux disease with esophagitis, without bleeding: Secondary | ICD-10-CM

## 2021-10-04 MED ORDER — PANTOPRAZOLE SODIUM 20 MG PO TBEC: 20.0000 mg | DELAYED_RELEASE_TABLET | Freq: Every day | ORAL | 2 refills | Status: DC

## 2021-11-08 ENCOUNTER — Ambulatory Visit: Payer: BC Managed Care – PPO | Admitting: Family Medicine

## 2021-11-08 ENCOUNTER — Encounter: Payer: Self-pay | Admitting: Family Medicine

## 2021-11-08 VITALS — BP 116/72 | HR 62 | Ht 64.25 in | Wt 211.0 lb

## 2021-11-08 DIAGNOSIS — M25511 Pain in right shoulder: Secondary | ICD-10-CM | POA: Diagnosis not present

## 2021-11-08 DIAGNOSIS — N289 Disorder of kidney and ureter, unspecified: Secondary | ICD-10-CM

## 2021-11-08 LAB — POCT URINALYSIS DIP (CLINITEK)
Bilirubin, UA: NEGATIVE
Blood, UA: NEGATIVE
Glucose, UA: NEGATIVE mg/dL
Ketones, POC UA: NEGATIVE mg/dL
Leukocytes, UA: NEGATIVE
Nitrite, UA: NEGATIVE
POC PROTEIN,UA: NEGATIVE
Spec Grav, UA: 1.015 (ref 1.010–1.025)
Urobilinogen, UA: 0.2 E.U./dL
pH, UA: 6.5 (ref 5.0–8.0)

## 2021-11-08 LAB — RENAL FUNCTION PANEL
Albumin: 4.1 g/dL (ref 3.5–5.2)
BUN: 14 mg/dL (ref 6–23)
CO2: 32 mEq/L (ref 19–32)
Calcium: 9.5 mg/dL (ref 8.4–10.5)
Chloride: 103 mEq/L (ref 96–112)
Creatinine, Ser: 1.18 mg/dL (ref 0.40–1.20)
GFR: 48.65 mL/min — ABNORMAL LOW (ref >60.00)
Glucose, Bld: 102 mg/dL — ABNORMAL HIGH (ref 70–99)
Phosphorus: 3.7 mg/dL (ref 2.3–4.6)
Potassium: 4 mEq/L (ref 3.5–5.1)
Sodium: 139 mEq/L (ref 135–145)

## 2021-11-08 LAB — URINALYSIS, ROUTINE W REFLEX MICROSCOPIC
Bilirubin Urine: NEGATIVE
Hgb urine dipstick: NEGATIVE
Ketones, ur: NEGATIVE
Leukocytes,Ua: NEGATIVE
Nitrite: NEGATIVE
Specific Gravity, Urine: 1.01 (ref 1.000–1.030)
Total Protein, Urine: NEGATIVE
Urine Glucose: NEGATIVE
Urobilinogen, UA: 1 (ref 0.0–1.0)
pH: 6.5 (ref 5.0–8.0)

## 2021-11-08 MED ORDER — PREDNISONE 20 MG PO TABS: ORAL_TABLET | ORAL | 0 refills | Status: DC

## 2021-11-08 NOTE — Assessment & Plan Note (Addendum)
Update labs today (UA, renal panel, PTH).

## 2021-11-08 NOTE — Patient Instructions (Addendum)
I think you may have bursitis again vs referred pain from neck. Treat with prednisone course sent to pharmacy as well as exercises provided today.  If not better, return to see Dr Lorelei Pont sports medicine.  Labs today to recheck kidney function. Continue good water intake.

## 2021-11-08 NOTE — Assessment & Plan Note (Signed)
Exam most consistent with shoulder bursitis. Discussed with patient. Rx prednisone taper given NSAID allergy. Provided with exercises from [facility] pt advisor - she has resistance band at home. Consider cervical contribution. Update if not improving, discussed possibly coming in to see sports med Dr Lorelei Pont.

## 2021-11-08 NOTE — Progress Notes (Signed)
Patient ID: Martha Wade, female    DOB: 04/11/1956, 66 y.o.   MRN: [id number]  This visit was conducted in person.  BP 116/72   Pulse 62   Ht 5' 4.25" (1.632 m)   Wt 211 lb (95.7 kg)   LMP 11/20/2015   SpO2 99%   BMI 35.94 kg/m    CC: R shoulder pain  Subjective:   HPI: Martha Wade is a 66 y.o. female presenting on 11/08/2021 for Shoulder Pain (Right shoulder , taking medication that given and otc , helps some but  still have pain ,can bare weight )   2-3 mo h/o pain that starts in R shoulder with radiation to posterior shoulder, posterior upper arm, down to elbow and lateral forearm just distal to elbow. Exacerbated when carrying something heavy. No numbness, weakness of arm. Occasional tingling to fingers of R hand. Arm aches at night time, improved when she elevates arm with a pillow in bed.   Denies inciting trauma/injury or falls.  Denies neck pain, shooting pain from neck.  So far has tried OTC remedies like methocarbamol, gabapentin (300-'600mg'$  qhs) as well as tylenol. No ice/heat. Has tried salon pas cream with lidocaine.   R handed - she's been using left hand more.   Also notes some L hand tingling.   H/o ACDF (C4-7) 2015 (Dr Martha Wade).      Relevant past medical, surgical, family and social history reviewed and updated as indicated. Interim medical history since our last visit reviewed. Allergies and medications reviewed and updated. Outpatient Medications Prior to Visit  Medication Sig Dispense Refill   acetaminophen (TYLENOL) 500 MG tablet Take 1,000 mg by mouth daily.     amLODipine (NORVASC) 5 MG tablet Take 1 tablet (5 mg total) by mouth daily. 90 tablet 3   atorvastatin (LIPITOR) 20 MG tablet Take 1 tablet (20 mg total) by mouth daily. 90 tablet 3   brimonidine (ALPHAGAN) 0.2 % ophthalmic solution      Cholecalciferol (VITAMIN D-3) 1000 UNITS CAPS Take 1,000 Units by mouth daily.     Coenzyme Q10 (COQ-10) 100 MG CAPS Take 100 mg by mouth daily.      cyanocobalamin 500 MCG tablet Take 500 mcg by mouth daily.      dorzolamide (TRUSOPT) 2 % ophthalmic solution PLACE 1 DROP INTO BOTH EYES TWICE A DAY  5   famotidine (PEPCID) 20 MG tablet Take 1 tablet (20 mg total) by mouth at bedtime. 90 tablet 3   gabapentin (NEURONTIN) 300 MG capsule Take 1 capsule (300 mg total) by mouth at bedtime. 90 capsule 3   latanoprost (XALATAN) 0.005 % ophthalmic solution   4   methocarbamol (ROBAXIN) 500 MG tablet Take 1 tablet (500 mg total) by mouth 2 (two) times daily as needed for muscle spasms. 30 tablet 3   Omega-3 Fatty Acids (FISH OIL) 1000 MG CAPS Take 1 capsule by mouth daily.     pantoprazole (PROTONIX) 20 MG tablet Take 1 tablet (20 mg total) by mouth daily. 30 tablet 2   RHOPRESSA 0.02 % SOLN Apply 1 drop to eye daily.     timolol (TIMOPTIC) 0.5 % ophthalmic solution   4   No facility-administered medications prior to visit.     Per HPI unless specifically indicated in ROS section below Review of Systems  Objective:  BP 116/72   Pulse 62   Ht 5' 4.25" (1.632 m)   Wt 211 lb (95.7 kg)   LMP 11/20/2015   SpO2  99%   BMI 35.94 kg/m   Wt Readings from Last 3 Encounters:  11/08/21 211 lb (95.7 kg)  09/17/21 209 lb (94.8 kg)  07/09/21 209 lb (94.8 kg)      Physical Exam Vitals and nursing note reviewed.  Constitutional:      Appearance: Normal appearance. She is not ill-appearing.  Neck:     Comments: Limited ROM predominantly lateral neck flexion bilaterally due to prior cervical fusion No midline cervical or paracervical mm tenderness to palpation  Neg spurling Musculoskeletal:     Cervical back: Normal range of motion and neck supple. No rigidity.     Right lower leg: No edema.     Left lower leg: No edema.     Comments:  L shoulder WNL R shoulder exam: No deformity of shoulders on inspection. Discomfort to palpation at R posterior shoulder at bursa as well as pain to palpation of posterior upper arm  FROM in abduction and  forward flexion. No pain or weakness with testing SITS in ext/int rotation. No pain with empty can sign. Neg Speed test. No impingement. No pain with rotation of humeral head in [facility] joint.   Skin:    General: Skin is warm and dry.     Findings: No rash.  Neurological:     Mental Status: She is alert.     Sensory: Sensation is intact.     Motor: Motor function is intact.     Coordination: Coordination is intact.     Gait: Gait is intact.     Comments:  5/5 strength intact BUE Sensation intact to light touch  Psychiatric:        Mood and Affect: Mood normal.        Behavior: Behavior normal.       Lab Results  Component Value Date   CREATININE 1.21 (H) 09/10/2021   BUN 10 09/10/2021   NA 140 09/10/2021   K 3.5 09/10/2021   CL 102 09/10/2021   CO2 32 09/10/2021     Assessment & Plan:   Problem List Items Addressed This Visit     Renal insufficiency    Update labs today (UA, renal panel, PTH).       Relevant Orders   POCT URINALYSIS DIP (CLINITEK)   Right shoulder pain - Primary    Exam most consistent with shoulder bursitis. Discussed with patient. Rx prednisone taper given NSAID allergy. Provided with exercises from [facility] pt advisor - she has resistance band at home. Consider cervical contribution. Update if not improving, discussed possibly coming in to see sports med Dr Lorelei Pont.         Meds ordered this encounter  Medications   predniSONE (DELTASONE) 20 MG tablet    Sig: Take two tablets daily for 3 days followed by one tablet daily for 4 days    Dispense:  10 tablet    Refill:  0   Orders Placed This Encounter  Procedures   POCT URINALYSIS DIP (CLINITEK)     Patient Instructions  I think you may have bursitis again vs referred pain from neck. Treat with prednisone course sent to pharmacy as well as exercises provided today.  If not better, return to see Dr Lorelei Pont sports medicine.  Labs today to recheck kidney function. Continue good water intake.    Follow up plan: Return if symptoms worsen or fail to improve.  Martha Bush, MD

## 2021-11-09 LAB — PARATHYROID HORMONE, INTACT (NO CA): PTH: 67 pg/mL (ref 16–77)

## 2021-11-18 ENCOUNTER — Other Ambulatory Visit: Payer: Self-pay | Admitting: Family Medicine

## 2021-11-19 ENCOUNTER — Other Ambulatory Visit: Payer: Self-pay | Admitting: Family Medicine

## 2021-11-19 DIAGNOSIS — Z1231 Encounter for screening mammogram for malignant neoplasm of breast: Secondary | ICD-10-CM

## 2021-12-12 ENCOUNTER — Other Ambulatory Visit: Payer: Self-pay | Admitting: Family Medicine

## 2021-12-12 NOTE — Telephone Encounter (Signed)
Too soon.  Rx sent on 09/17/21, #90/3 to [facility] [place].

## 2021-12-20 ENCOUNTER — Encounter: Payer: Self-pay | Admitting: Family Medicine

## 2021-12-23 ENCOUNTER — Ambulatory Visit
Admission: RE | Admit: 2021-12-23 | Discharge: 2021-12-23 | Disposition: A | Discharge status: Home / Self Care | Payer: BC Managed Care – PPO | Source: Ambulatory Visit | Attending: Family Medicine | Admitting: Family Medicine

## 2021-12-23 DIAGNOSIS — Z1231 Encounter for screening mammogram for malignant neoplasm of breast: Secondary | ICD-10-CM | POA: Diagnosis present

## 2022-01-23 ENCOUNTER — Other Ambulatory Visit: Payer: Self-pay

## 2022-01-23 DIAGNOSIS — M62838 Other muscle spasm: Secondary | ICD-10-CM

## 2022-01-23 NOTE — Telephone Encounter (Signed)
Robaxin Last rx:  09/17/21, #30 Last OV: 11/08/21, CPE Next OV:  NONE

## 2022-01-24 MED ORDER — METHOCARBAMOL 500 MG PO TABS: 500.0000 mg | ORAL_TABLET | Freq: Two times a day (BID) | ORAL | 3 refills | Status: DC | PRN

## 2022-03-06 ENCOUNTER — Encounter: Payer: Self-pay | Admitting: Family Medicine

## 2022-03-07 MED ORDER — FAMOTIDINE 20 MG PO TABS
20.0000 mg | ORAL_TABLET | Freq: Two times a day (BID) | ORAL | 3 refills | Status: DC
Start: 2022-03-07 — End: 2022-12-03

## 2022-03-07 NOTE — Telephone Encounter (Signed)
ERx 

## 2022-05-20 ENCOUNTER — Encounter: Payer: Self-pay | Admitting: Family Medicine

## 2022-05-20 ENCOUNTER — Ambulatory Visit: Payer: BC Managed Care – PPO | Admitting: Family Medicine

## 2022-05-20 VITALS — BP 130/80 | HR 88 | Temp 97.7°F | Ht 64.25 in | Wt 210.0 lb

## 2022-05-20 DIAGNOSIS — M65311 Trigger thumb, right thumb: Secondary | ICD-10-CM | POA: Diagnosis not present

## 2022-05-20 NOTE — Progress Notes (Signed)
Patient ID: Martha Wade, female    DOB: 1955/07/18, 67 y.o.   MRN: [id number]  This visit was conducted in person.  BP 130/80   Pulse 88   Temp 97.7 F (36.5 C) (Temporal)   Ht 5' 4.25" (1.632 m)   Wt 210 lb (95.3 kg)   LMP 11/20/2015   SpO2 96%   BMI 35.77 kg/m    CC: check right thumb Subjective:   HPI: Martha Wade is a 67 y.o. female presenting on 05/20/2022 for Hand Pain (C/o R thumb pain/swelling. Thinks she may have injured it 2-3 wks ago. )   2 wk h/o R thumb pain at MCP base into finger.  May have started after she was using manual can opener because her electric can opener died.  Pain, swelling, feels clicking with flexion of IP.  No redness, fever, warmth.  No known h/o gout.  She is right handed - more difficulty due to this recently.  She's used home made splint with tape.      Relevant past medical, surgical, family and social history reviewed and updated as indicated. Interim medical history since our last visit reviewed. Allergies and medications reviewed and updated. Outpatient Medications Prior to Visit  Medication Sig Dispense Refill   acetaminophen (TYLENOL) 500 MG tablet Take 1,000 mg by mouth daily.     amLODipine (NORVASC) 5 MG tablet Take 1 tablet (5 mg total) by mouth daily. 90 tablet 3   atorvastatin (LIPITOR) 20 MG tablet Take 1 tablet (20 mg total) by mouth daily. 90 tablet 3   brimonidine (ALPHAGAN) 0.2 % ophthalmic solution      Cholecalciferol (VITAMIN D-3) 1000 UNITS CAPS Take 1,000 Units by mouth daily.     Coenzyme Q10 (COQ-10) 100 MG CAPS Take 100 mg by mouth daily.     cyanocobalamin 500 MCG tablet Take 500 mcg by mouth daily.      dorzolamide (TRUSOPT) 2 % ophthalmic solution PLACE 1 DROP INTO BOTH EYES TWICE A DAY  5   famotidine (PEPCID) 20 MG tablet Take 1 tablet (20 mg total) by mouth 2 (two) times daily. 180 tablet 3   gabapentin (NEURONTIN) 300 MG capsule Take 1 capsule (300 mg total) by mouth at bedtime. 90 capsule 3    latanoprost (XALATAN) 0.005 % ophthalmic solution   4   methocarbamol (ROBAXIN) 500 MG tablet Take 1 tablet (500 mg total) by mouth 2 (two) times daily as needed for muscle spasms. 30 tablet 3   Omega-3 Fatty Acids (FISH OIL) 1000 MG CAPS Take 1 capsule by mouth daily.     pantoprazole (PROTONIX) 20 MG tablet Take 1 tablet (20 mg total) by mouth daily. 30 tablet 2   predniSONE (DELTASONE) 20 MG tablet Take two tablets daily for 3 days followed by one tablet daily for 4 days 10 tablet 0   RHOPRESSA 0.02 % SOLN Apply 1 drop to eye daily.     timolol (TIMOPTIC) 0.5 % ophthalmic solution   4   No facility-administered medications prior to visit.     Per HPI unless specifically indicated in ROS section below Review of Systems  Objective:  BP 130/80   Pulse 88   Temp 97.7 F (36.5 C) (Temporal)   Ht 5' 4.25" (1.632 m)   Wt 210 lb (95.3 kg)   LMP 11/20/2015   SpO2 96%   BMI 35.77 kg/m   Wt Readings from Last 3 Encounters:  05/20/22 210 lb (95.3 kg)  11/08/21 211 lb (  95.7 kg)  09/17/21 209 lb (94.8 kg)      Physical Exam Vitals and nursing note reviewed.  Constitutional:      Appearance: Normal appearance. She is not ill-appearing.  Musculoskeletal:        General: Tenderness present. Normal range of motion.       Hands:     Comments:  L hand WNL R thumb - tenderness to palpation predominantly at ventral MCP joint with palpable tender nodule. No significant pain at anatomical snuffbox or at [facility] joint or at IP joint. No pain with axial loading. Palpable click with flexion of thumb at nodule  Skin:    General: Skin is warm and dry.     Findings: No rash.  Neurological:     Mental Status: She is alert.       Lab Results  Component Value Date   CREATININE 1.18 11/08/2021   BUN 14 11/08/2021   NA 139 11/08/2021   K 4.0 11/08/2021   CL 103 11/08/2021   CO2 32 11/08/2021    Assessment & Plan:   Trigger thumb of right hand Assessment & Plan: Reviewed diagnosis with  patient. Avoid oral NSAIDs in CKD.  Rec tylenol and topical voltaren.  Placed thumb in splint.  F/u with sports medicine [place] if not improved to consider steroid injection. Pt agrees with plan.     Patient Instructions  I think you have trigger thumb - from swollen tendon nodule cause symptoms. Try thumb splint.  Take tylenol for discomfort, voltaren topically anti inflammatory, and schedule appointment with Dr Lorelei Pont to consider steroid injection if not improving.    Follow up plan: Return if symptoms worsen or fail to improve.  Martha Bush, MD

## 2022-05-20 NOTE — Assessment & Plan Note (Addendum)
Reviewed diagnosis with patient. Avoid oral NSAIDs in CKD.  Rec tylenol and topical voltaren.  Placed thumb in splint.  F/u with sports medicine [facility] if not improved to consider steroid injection. Pt agrees with plan.

## 2022-05-20 NOTE — Patient Instructions (Signed)
I think you have trigger thumb - from swollen tendon nodule cause symptoms. Try thumb splint.  Take tylenol for discomfort, voltaren topically anti inflammatory, and schedule appointment with Dr Lorelei Pont to consider steroid injection if not improving.

## 2022-06-01 NOTE — Progress Notes (Signed)
Dayten Juba T. Reagen Goates, MD, [place] at [facility] [place], [address]  Phone: [phone]  FAX: [phone]  Martha Wade - 67 y.o. female  MRN [id number]  Date of Birth: Feb 11, 1956  Date: 06/02/2022  PCP: Ria Bush, MD  Referral: Ria Bush, MD  Chief Complaint  Patient presents with   Hand Pain    Right Thumb    Subjective:   Martha Wade is a 67 y.o. very pleasant female patient with Body mass index is 36.93 kg/m. who presents with the following:  She is a pleasant patient, she presents with right-sided thumb pain.  This is with a presumed trigger thumb.  She is seen in consultation for Dr. Danise Mina.  Inj R thumb  Classic trigger finger with a fairly large nodule at the base of the first digit in the anterior aspect of the MCP joint.  Tendon Sheath Injection Procedure Note Fiora Weill 10-21-1955 Date of procedure: 06/02/2022  Procedure: Tendon Sheath Injection for Trigger Finger, R first digit. Indications: Pain  Procedure Details Verbal consent was obtained. Risks (including potential risk for skin lightening and potential atrophy), benefits and alternatives were discussed. Prepped with Chloraprep and Ethyl Chloride used for anesthesia. Under sterile conditions, patient injected at palmar crease aiming distally with 45 degree angle towards nodule; injected directly into tendon sheath. Medication flowed freely without resistance.  Needle size: 22 gauge 1 1/2 inch Injection: 1/2 cc of Lidocaine 1% and Kenalog 20 mg Medication: 1/2 cc of Kenalog 40 mg (equaling Kenalog 20 mg)     ICD-10-CM   1. Trigger thumb of right hand  M65.311 triamcinolone acetonide (KENALOG-40) injection 20 mg      Medication Management during today's office visit: Meds ordered this encounter  Medications   triamcinolone acetonide (KENALOG-40) injection 20 mg   Signed,  Zaire Vanbuskirk T. Nataly Pacifico,  MD   Outpatient Encounter Medications as of 06/02/2022  Medication Sig   acetaminophen (TYLENOL) 500 MG tablet Take 1,000 mg by mouth daily.   amLODipine (NORVASC) 5 MG tablet Take 1 tablet (5 mg total) by mouth daily.   atorvastatin (LIPITOR) 20 MG tablet Take 1 tablet (20 mg total) by mouth daily.   brimonidine (ALPHAGAN) 0.2 % ophthalmic solution    Cholecalciferol (VITAMIN D-3) 1000 UNITS CAPS Take 1,000 Units by mouth daily.   Coenzyme Q10 (COQ-10) 100 MG CAPS Take 100 mg by mouth daily.   cyanocobalamin 500 MCG tablet Take 500 mcg by mouth daily.    dorzolamide (TRUSOPT) 2 % ophthalmic solution PLACE 1 DROP INTO BOTH EYES TWICE A DAY   famotidine (PEPCID) 20 MG tablet Take 1 tablet (20 mg total) by mouth 2 (two) times daily.   gabapentin (NEURONTIN) 300 MG capsule Take 1 capsule (300 mg total) by mouth at bedtime.   latanoprost (XALATAN) 0.005 % ophthalmic solution    methocarbamol (ROBAXIN) 500 MG tablet Take 1 tablet (500 mg total) by mouth 2 (two) times daily as needed for muscle spasms.   Omega-3 Fatty Acids (FISH OIL) 1000 MG CAPS Take 1 capsule by mouth daily.   RHOPRESSA 0.02 % SOLN Apply 1 drop to eye daily.   timolol (TIMOPTIC) 0.5 % ophthalmic solution    [DISCONTINUED] pantoprazole (PROTONIX) 20 MG tablet Take 1 tablet (20 mg total) by mouth daily.   [DISCONTINUED] predniSONE (DELTASONE) 20 MG tablet Take two tablets daily for 3 days followed by one tablet daily for 4 days   [EXPIRED] triamcinolone acetonide (KENALOG-40) injection  20 mg    No facility-administered encounter medications on file as of 06/02/2022.

## 2022-06-02 ENCOUNTER — Ambulatory Visit (INDEPENDENT_AMBULATORY_CARE_PROVIDER_SITE_OTHER): Payer: BC Managed Care – PPO | Admitting: Family Medicine

## 2022-06-02 ENCOUNTER — Encounter: Payer: Self-pay | Admitting: Family Medicine

## 2022-06-02 VITALS — BP 120/70 | HR 71 | Temp 98.7°F | Ht 63.0 in | Wt 208.5 lb

## 2022-06-02 DIAGNOSIS — M65311 Trigger thumb, right thumb: Secondary | ICD-10-CM | POA: Diagnosis not present

## 2022-06-02 MED ORDER — TRIAMCINOLONE ACETONIDE 40 MG/ML IJ SUSP
20.0000 mg | Freq: Once | INTRAMUSCULAR | Status: AC
  Administered 2022-06-02: 20 mg via INTRA_ARTICULAR

## 2022-06-03 ENCOUNTER — Encounter: Payer: Self-pay | Admitting: Family Medicine

## 2022-06-16 ENCOUNTER — Encounter: Payer: Self-pay | Admitting: Nurse Practitioner

## 2022-06-16 ENCOUNTER — Ambulatory Visit: Payer: BC Managed Care – PPO | Admitting: Nurse Practitioner

## 2022-06-16 VITALS — BP 130/80 | HR 65 | Temp 98.0°F | Resp 16 | Ht 63.0 in | Wt 209.4 lb

## 2022-06-16 DIAGNOSIS — L0291 Cutaneous abscess, unspecified: Secondary | ICD-10-CM | POA: Insufficient documentation

## 2022-06-16 MED ORDER — DOXYCYCLINE HYCLATE 100 MG PO TABS: 100.0000 mg | ORAL_TABLET | Freq: Two times a day (BID) | ORAL | 0 refills | Status: AC

## 2022-06-16 NOTE — Progress Notes (Signed)
   Acute Office Visit  Subjective:     Patient ID: Martha Wade, female    DOB: February 26, 1956, 67 y.o.   MRN: [id number]  Chief Complaint  Patient presents with   Mass    On top of knee on the left side X 1 week    HPI Patient is in today for knee problem with a history of HTN, GERD, renal insufficiency, HLD  Symptoms started approx 1-1.5 weeks ago. States that when it came up it hurt to the touch. States that the next day the lesion was filled with puss. States that she started using a topical abx cream and a "draw save". States that she used a band aid. States that on Saturday she did express    Review of Systems  Constitutional:  Negative for chills and fever.  Musculoskeletal:  Negative for joint pain.  Skin:        "+" lesion with discharge        Objective:    BP 130/80   Pulse 65   Temp 98 F (36.7 C)   Resp 16   Ht '5\' 3"'$  (1.6 m)   Wt 209 lb 6 oz (95 kg)   LMP 11/20/2015   SpO2 97%   BMI 37.09 kg/m    Physical Exam Vitals and nursing note reviewed.  Constitutional:      Appearance: Normal appearance.  Cardiovascular:     Rate and Rhythm: Normal rate and regular rhythm.     Heart sounds: Normal heart sounds.  Pulmonary:     Effort: Pulmonary effort is normal.     Breath sounds: Normal breath sounds.  Skin:    Findings: Erythema and lesion present.       Neurological:     Mental Status: She is alert.     No results found for any visits on 06/16/22.      Assessment & Plan:   Problem List Items Addressed This Visit       Other   Abscess - Primary    Nonfluctuant.  No discharge from wound culture will treat with doxycycline 100 mg twice daily.  Follow-up if no improvement she can clean it with soap and water keep it covered today if she would like.  Also warm compresses to help with some discomfort      Relevant Medications   doxycycline (VIBRA-TABS) 100 MG tablet    Meds ordered this encounter  Medications   doxycycline (VIBRA-TABS)  100 MG tablet    Sig: Take 1 tablet (100 mg total) by mouth 2 (two) times daily for 7 days.    Dispense:  14 tablet    Refill:  0    Order Specific Question:   Supervising Provider    Answer:   TOWER, MARNE A [[id number]    Return if symptoms worsen or fail to improve.  Romilda Garret, NP

## 2022-06-16 NOTE — Patient Instructions (Signed)
Nice to see you today If you do not improve with the antibiotics let me know

## 2022-06-16 NOTE — Assessment & Plan Note (Signed)
Nonfluctuant.  No discharge from wound culture will treat with doxycycline 100 mg twice daily.  Follow-up if no improvement she can clean it with soap and water keep it covered today if she would like.  Also warm compresses to help with some discomfort

## 2022-06-17 ENCOUNTER — Other Ambulatory Visit: Payer: Self-pay | Admitting: Family Medicine

## 2022-06-17 DIAGNOSIS — M62838 Other muscle spasm: Secondary | ICD-10-CM

## 2022-06-17 NOTE — Telephone Encounter (Signed)
Refill request Robaxin Last refill 01/23/22 #30/3 Last office visit 06/16/22 acute

## 2022-07-22 ENCOUNTER — Ambulatory Visit: Payer: BC Managed Care – PPO | Admitting: Family Medicine

## 2022-07-22 ENCOUNTER — Encounter: Payer: Self-pay | Admitting: Family Medicine

## 2022-07-22 VITALS — BP 128/80 | HR 71 | Temp 97.2°F | Ht 63.0 in | Wt 200.5 lb

## 2022-07-22 DIAGNOSIS — M25562 Pain in left knee: Secondary | ICD-10-CM

## 2022-07-22 MED ORDER — DICLOFENAC SODIUM 1 % EX GEL: 1.0000 | Freq: Three times a day (TID) | CUTANEOUS | 1 refills | Status: AC

## 2022-07-22 NOTE — Progress Notes (Signed)
Patient ID: Martha Wade, female    DOB: 11-20-1955, 67 y.o.   MRN: [id number]  This visit was conducted in person.  BP 128/80   Pulse 71   Temp (!) 97.2 F (36.2 C) (Temporal)   Ht '5\' 3"'$  (1.6 m)   Wt 200 lb 8 oz (90.9 kg)   LMP 11/20/2015   SpO2 98%   BMI 35.52 kg/m    CC: L knee pain/swelling Subjective:   HPI: Martha Wade is a 67 y.o. female presenting on 07/22/2022 for Knee Pain (C/o L knee pain and swelling. Occasional tenderness in back of knee. [place] last week after knee buckled. )   DOI: last week  While getting ready for work, L knee buckled and she fell to the floor. First fall in years.  Even prior to fall she had noted pain and swelling to left posterior knee for 2 weeks.  Pain with ambulation but no locking of knee or instability.  No calf pain with ambulation.   She's been treating with heating pad, advil (caused nausea) without benefit.  No h/o knee surgery.      Relevant past medical, surgical, family and social history reviewed and updated as indicated. Interim medical history since our last visit reviewed. Allergies and medications reviewed and updated. Outpatient Medications Prior to Visit  Medication Sig Dispense Refill   acetaminophen (TYLENOL) 500 MG tablet Take 1,000 mg by mouth daily.     amLODipine (NORVASC) 5 MG tablet Take 1 tablet (5 mg total) by mouth daily. 90 tablet 3   atorvastatin (LIPITOR) 20 MG tablet Take 1 tablet (20 mg total) by mouth daily. 90 tablet 3   brimonidine (ALPHAGAN) 0.2 % ophthalmic solution      Cholecalciferol (VITAMIN D-3) 1000 UNITS CAPS Take 1,000 Units by mouth daily.     Coenzyme Q10 (COQ-10) 100 MG CAPS Take 100 mg by mouth daily.     cyanocobalamin 500 MCG tablet Take 500 mcg by mouth daily.      dorzolamide (TRUSOPT) 2 % ophthalmic solution PLACE 1 DROP INTO BOTH EYES TWICE A DAY  5   famotidine (PEPCID) 20 MG tablet Take 1 tablet (20 mg total) by mouth 2 (two) times daily. 180 tablet 3   gabapentin  (NEURONTIN) 300 MG capsule Take 1 capsule (300 mg total) by mouth at bedtime. 90 capsule 3   latanoprost (XALATAN) 0.005 % ophthalmic solution   4   methocarbamol (ROBAXIN) 500 MG tablet TAKE 1 TABLET(500 MG) BY MOUTH TWICE DAILY AS NEEDED FOR MUSCLE SPASMS 30 tablet 3   Omega-3 Fatty Acids (FISH OIL) 1000 MG CAPS Take 1 capsule by mouth daily.     timolol (TIMOPTIC) 0.5 % ophthalmic solution   4   RHOPRESSA 0.02 % SOLN Apply 1 drop to eye daily.     No facility-administered medications prior to visit.     Per HPI unless specifically indicated in ROS section below Review of Systems  Objective:  BP 128/80   Pulse 71   Temp (!) 97.2 F (36.2 C) (Temporal)   Ht '5\' 3"'$  (1.6 m)   Wt 200 lb 8 oz (90.9 kg)   LMP 11/20/2015   SpO2 98%   BMI 35.52 kg/m   Wt Readings from Last 3 Encounters:  07/22/22 200 lb 8 oz (90.9 kg)  06/16/22 209 lb 6 oz (95 kg)  06/02/22 208 lb 8 oz (94.6 kg)      Physical Exam Vitals and nursing note reviewed.  Constitutional:  Appearance: Normal appearance. She is not ill-appearing.  Musculoskeletal:        General: Tenderness present. Normal range of motion.     Right lower leg: No edema.     Left lower leg: No edema.     Comments:  L knee - crepitus with flexion/extension otherwise normal  R knee exam: No deformity on inspection. No effusion/swelling noted. Discomfort with palpation of knee esp posteriorly with popliteal fullness. FROM in flex/extension with marked crepitus. Neg drawer test. Popping but no pain with mcmurray test. No pain with valgus/varus stress. ++ PFgrind. No abnormal patellar mobility.   Skin:    General: Skin is warm and dry.     Findings: No rash.  Neurological:     Mental Status: She is alert.  Psychiatric:        Mood and Affect: Mood normal.        Behavior: Behavior normal.        Assessment & Plan:   Problem List Items Addressed This Visit     Left knee pain - Primary    Anticipate patellofemoral pain  syndrome in conjunction with possible arthritis flare with baker's cyst.  Supportive measures reviewed - tylenol, topical voltaren (watching for rash or other allergy/side effects), knee brace, and provided with exercises from SM pt advisor.  If persistent despite above, let us know for formal PT referral.  Advised if any worsening to schedule f/u with sports medicine. Pt agrees with plan.         Meds ordered this encounter  Medications   diclofenac Sodium (VOLTAREN) 1 % GEL    Sig: Apply 1 Application topically 3 (three) times daily.    Dispense:  100 g    Refill:  1    No orders of the defined types were placed in this encounter.   Patient Instructions  I think you have patellofemoral pain syndrome as well as possible arthritis contribution.  Treat with voltaren gel sent to pharmacy. May use tylenol as needed as well - 2 tablets 2-3 times daily.  Buy knee sleeve brace for extra support especially when on your feet for long periods of time.  Do exercises provided. If not improving with this, let us know for appointment with Dr Lorelei Pont or formal physical therapy.   Follow up plan: Return if symptoms worsen or fail to improve.  Ria Bush, MD

## 2022-07-22 NOTE — Patient Instructions (Addendum)
I think you have patellofemoral pain syndrome as well as possible arthritis contribution.  Treat with voltaren gel sent to pharmacy. May use tylenol as needed as well - 2 tablets 2-3 times daily.  Buy knee sleeve brace for extra support especially when on your feet for long periods of time.  Do exercises provided. If not improving with this, let us know for appointment with Dr Lorelei Pont or formal physical therapy.

## 2022-07-22 NOTE — Assessment & Plan Note (Signed)
Anticipate patellofemoral pain syndrome in conjunction with possible arthritis flare with baker's cyst.  Supportive measures reviewed - tylenol, topical voltaren (watching for rash or other allergy/side effects), knee brace, and provided with exercises from SM pt advisor.  If persistent despite above, let us know for formal PT referral.  Advised if any worsening to schedule f/u with sports medicine. Pt agrees with plan.

## 2022-09-09 ENCOUNTER — Other Ambulatory Visit (HOSPITAL_COMMUNITY): Payer: Self-pay

## 2022-09-21 NOTE — Progress Notes (Deleted)
    Bassheva Flury T. Selam Pietsch, MD, CAQ Sports Medicine [facility] at [facility] [address] [place], [address]  Phone: [phone]  FAX: [phone]  Shenoa Bachtell - 67 y.o. female  MRN [id number]  Date of Birth: 1955/11/20  Date: 09/22/2022  PCP: Eustaquio Boyden, MD  Referral: Eustaquio Boyden, MD  No chief complaint on file.  Subjective:   Alysiah Varas is a 67 y.o. very pleasant female patient with There is no height or weight on file to calculate BMI. who presents with the following:  Patient presents with ongoing right-sided thumb pain.  She did have a trigger thumb that I saw in January of this year.    Review of Systems is noted in the HPI, as appropriate  Objective:   LMP 11/20/2015   GEN: No acute distress; alert,appropriate. PULM: Breathing comfortably in no respiratory distress PSYCH: Normally interactive.   Laboratory and Imaging Data:  Assessment and Plan:   ***

## 2022-09-22 ENCOUNTER — Ambulatory Visit: Payer: BC Managed Care – PPO | Admitting: Family Medicine

## 2022-09-22 DIAGNOSIS — M65311 Trigger thumb, right thumb: Secondary | ICD-10-CM

## 2022-09-23 NOTE — Progress Notes (Signed)
    Cobie Leidner T. Saman Umstead, MD, CAQ [facility] [address] [place], [address]  Phone: [phone]  FAX: [phone]  Martha Wade - 67 y.o. female  MRN [id number]  Date of Birth: [birth year]  Date: 09/24/2022  PCP: Eustaquio Boyden, MD  Referral: Eustaquio Boyden, MD  No chief complaint on file.  Subjective:   Martha Wade is a 67 y.o. very pleasant female patient with There is no height or weight on file to calculate BMI. who presents with the following:  Patient presents with ongoing thumb pain.  I saw her in January 2024 for trigger thumb of the right hand.    Review of Systems is noted in the HPI, as appropriate  Objective:   LMP 11/20/2015   GEN: No acute distress; alert,appropriate. PULM: Breathing comfortably in no respiratory distress PSYCH: Normally interactive.   Laboratory and Imaging Data:  Assessment and Plan:   ***

## 2022-09-24 ENCOUNTER — Ambulatory Visit: Payer: BC Managed Care – PPO | Admitting: Family Medicine

## 2022-09-24 ENCOUNTER — Encounter: Payer: Self-pay | Admitting: Family Medicine

## 2022-09-24 VITALS — BP 110/60 | HR 81 | Temp 97.4°F | Ht 63.0 in | Wt 211.4 lb

## 2022-09-24 DIAGNOSIS — M65311 Trigger thumb, right thumb: Secondary | ICD-10-CM

## 2022-09-24 MED ORDER — TRIAMCINOLONE ACETONIDE 40 MG/ML IJ SUSP
20.0000 mg | Freq: Once | INTRAMUSCULAR | Status: AC
  Administered 2022-09-24: 20 mg via INTRA_ARTICULAR

## 2022-09-24 NOTE — Addendum Note (Signed)
Addended by: Damita Lack on: 09/24/2022 08:56 AM   Modules accepted: Orders

## 2022-11-01 ENCOUNTER — Other Ambulatory Visit: Payer: Self-pay | Admitting: Family Medicine

## 2022-11-01 DIAGNOSIS — M62838 Other muscle spasm: Secondary | ICD-10-CM

## 2022-11-03 NOTE — Telephone Encounter (Signed)
Pt has OV tomorrow.

## 2022-11-04 ENCOUNTER — Ambulatory Visit: Payer: BC Managed Care – PPO | Admitting: Family Medicine

## 2022-11-04 ENCOUNTER — Encounter: Payer: Self-pay | Admitting: Family Medicine

## 2022-11-04 VITALS — BP 144/88 | HR 70 | Temp 97.2°F | Ht 63.0 in | Wt 211.2 lb

## 2022-11-04 DIAGNOSIS — M62838 Other muscle spasm: Secondary | ICD-10-CM | POA: Diagnosis not present

## 2022-11-04 DIAGNOSIS — L989 Disorder of the skin and subcutaneous tissue, unspecified: Secondary | ICD-10-CM

## 2022-11-04 DIAGNOSIS — I10 Essential (primary) hypertension: Secondary | ICD-10-CM

## 2022-11-04 DIAGNOSIS — Z6835 Body mass index (BMI) 35.0-35.9, adult: Secondary | ICD-10-CM | POA: Diagnosis not present

## 2022-11-04 MED ORDER — CONTRAVE 8-90 MG PO TB12: ORAL_TABLET | ORAL | 0 refills | Status: DC

## 2022-11-04 MED ORDER — GABAPENTIN 300 MG PO CAPS: 300.0000 mg | ORAL_CAPSULE | Freq: Every day | ORAL | 0 refills | Status: DC

## 2022-11-04 MED ORDER — METHOCARBAMOL 500 MG PO TABS: ORAL_TABLET | ORAL | 3 refills | Status: DC
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
## 2022-11-04 NOTE — Patient Instructions (Addendum)
We will refer you to dermatologist to check right cheek lesion.  Robaxin muscle relaxant refilled.  I will send Contrave to price out at pharmacy - follow titration schedule. Let me know if unaffordable. If weight loss medicine started, return monthly for weight check visits. Check with insurance on [place] weekly shot coverage.  Return in 4-6 weeks for physical, prior fasting for labs.

## 2022-11-05 DIAGNOSIS — L989 Disorder of the skin and subcutaneous tissue, unspecified: Secondary | ICD-10-CM | POA: Insufficient documentation

## 2022-11-05 NOTE — Assessment & Plan Note (Addendum)
Skin growth of uncertain etiology to R cheek - pt desires removal.  ?skin tag vs developing cutaneous horn Will refer to dermatologist in [place].

## 2022-11-05 NOTE — Assessment & Plan Note (Addendum)
Encouraged weight loss to help joint and back pain.  Discussed bariatric medication options. She has [employer] state health plan - Reginal Lutes is not covered.  Will trial Contrave, reviewed mechanism of action of individual components, as well as side effects to monitor for. Reviewed titration schedule.  RTC 4-6 wks f/u visit after starting.

## 2022-11-05 NOTE — Assessment & Plan Note (Addendum)
BP mildly elevated today - in setting of missed dose this morning.  No change indicated.

## 2022-11-12 IMAGING — DX DG SI JOINTS 3+V
3 series · 3 of 3 positions shown · non-contrast
Comparison: None.

CLINICAL DATA: Chronic right sacral iliac joint pain.

EXAM:
BILATERAL SACROILIAC JOINTS - 3+ VIEW

[si joint (1 of 2)]
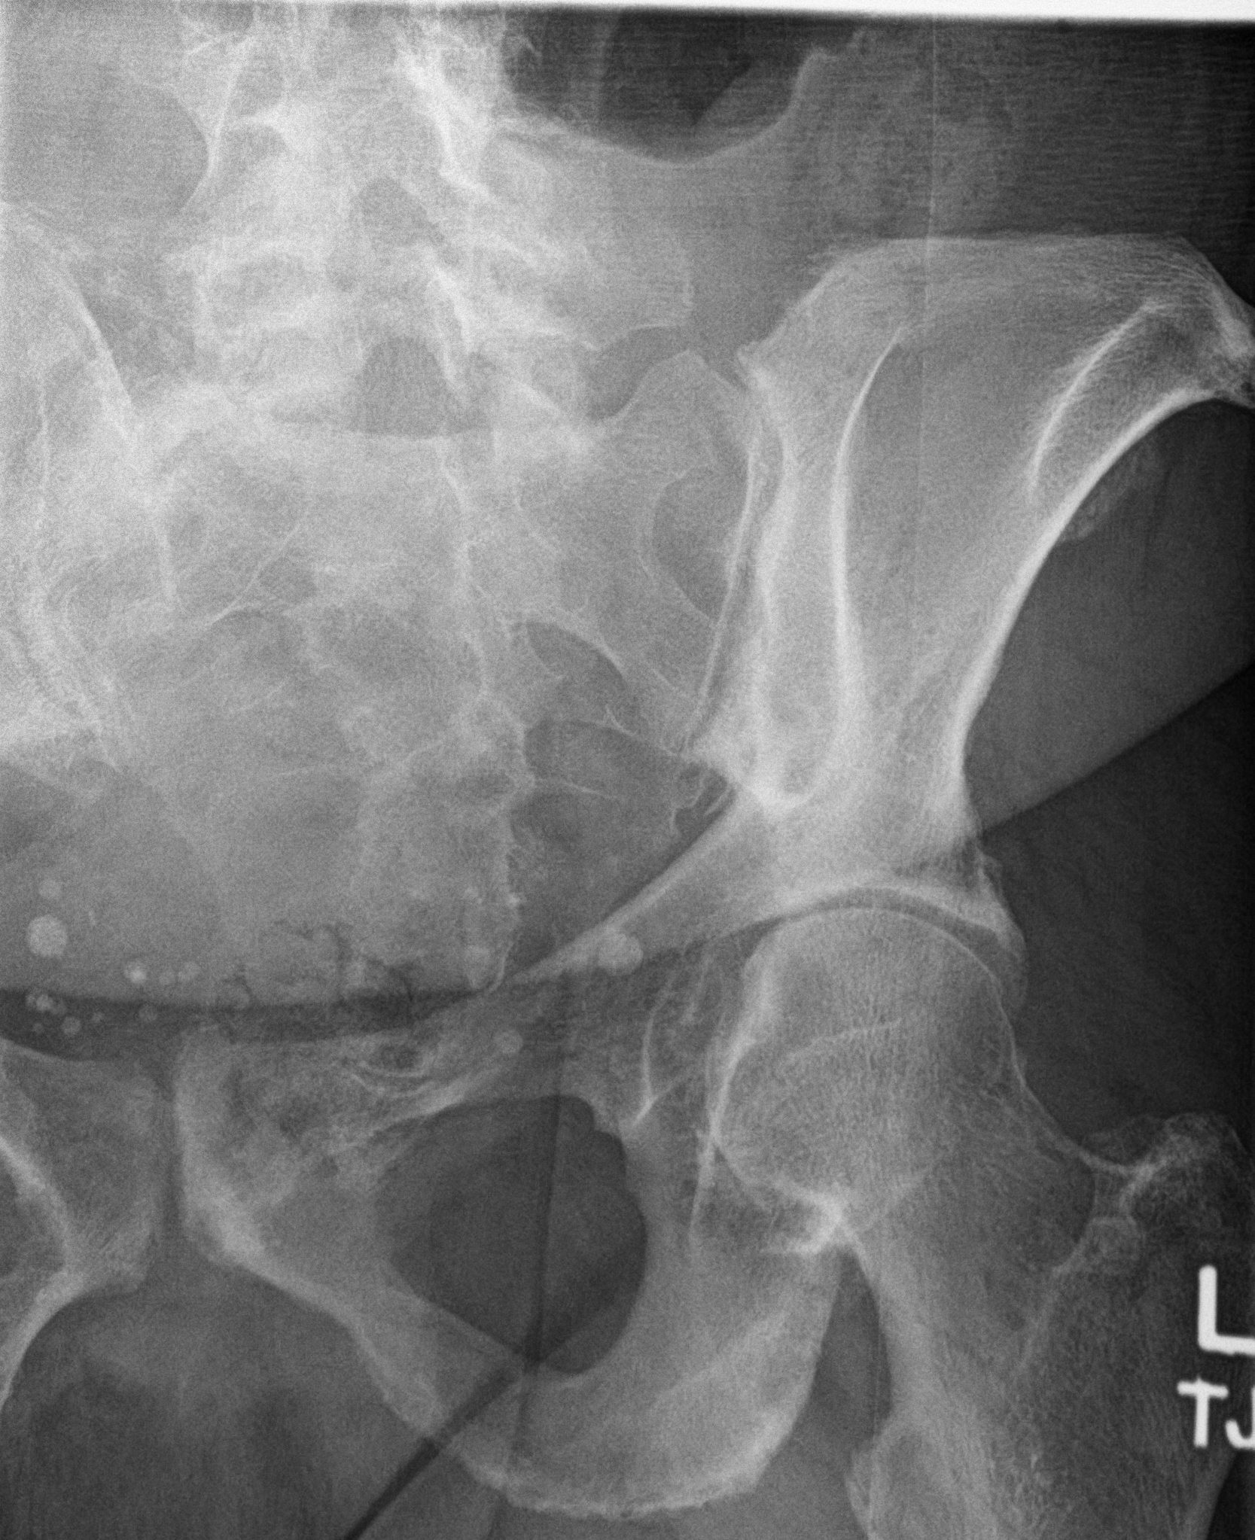

[si joint (2 of 2)]
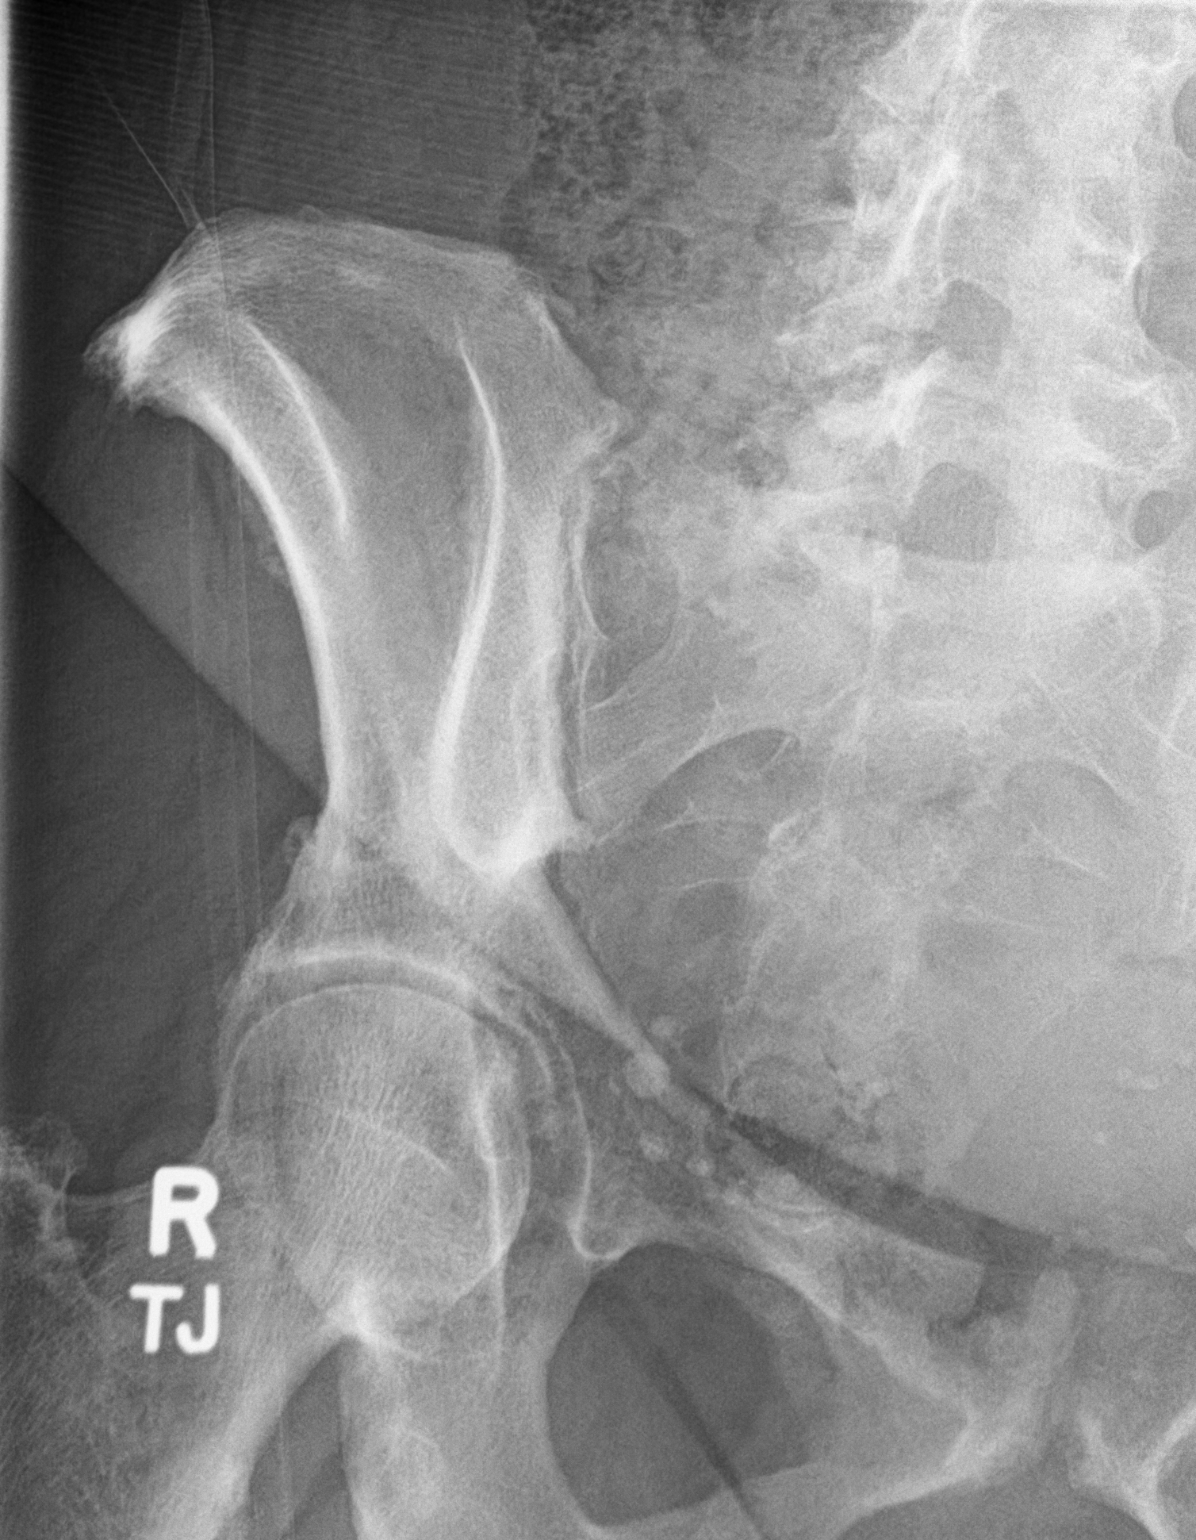

[pelvis ap]
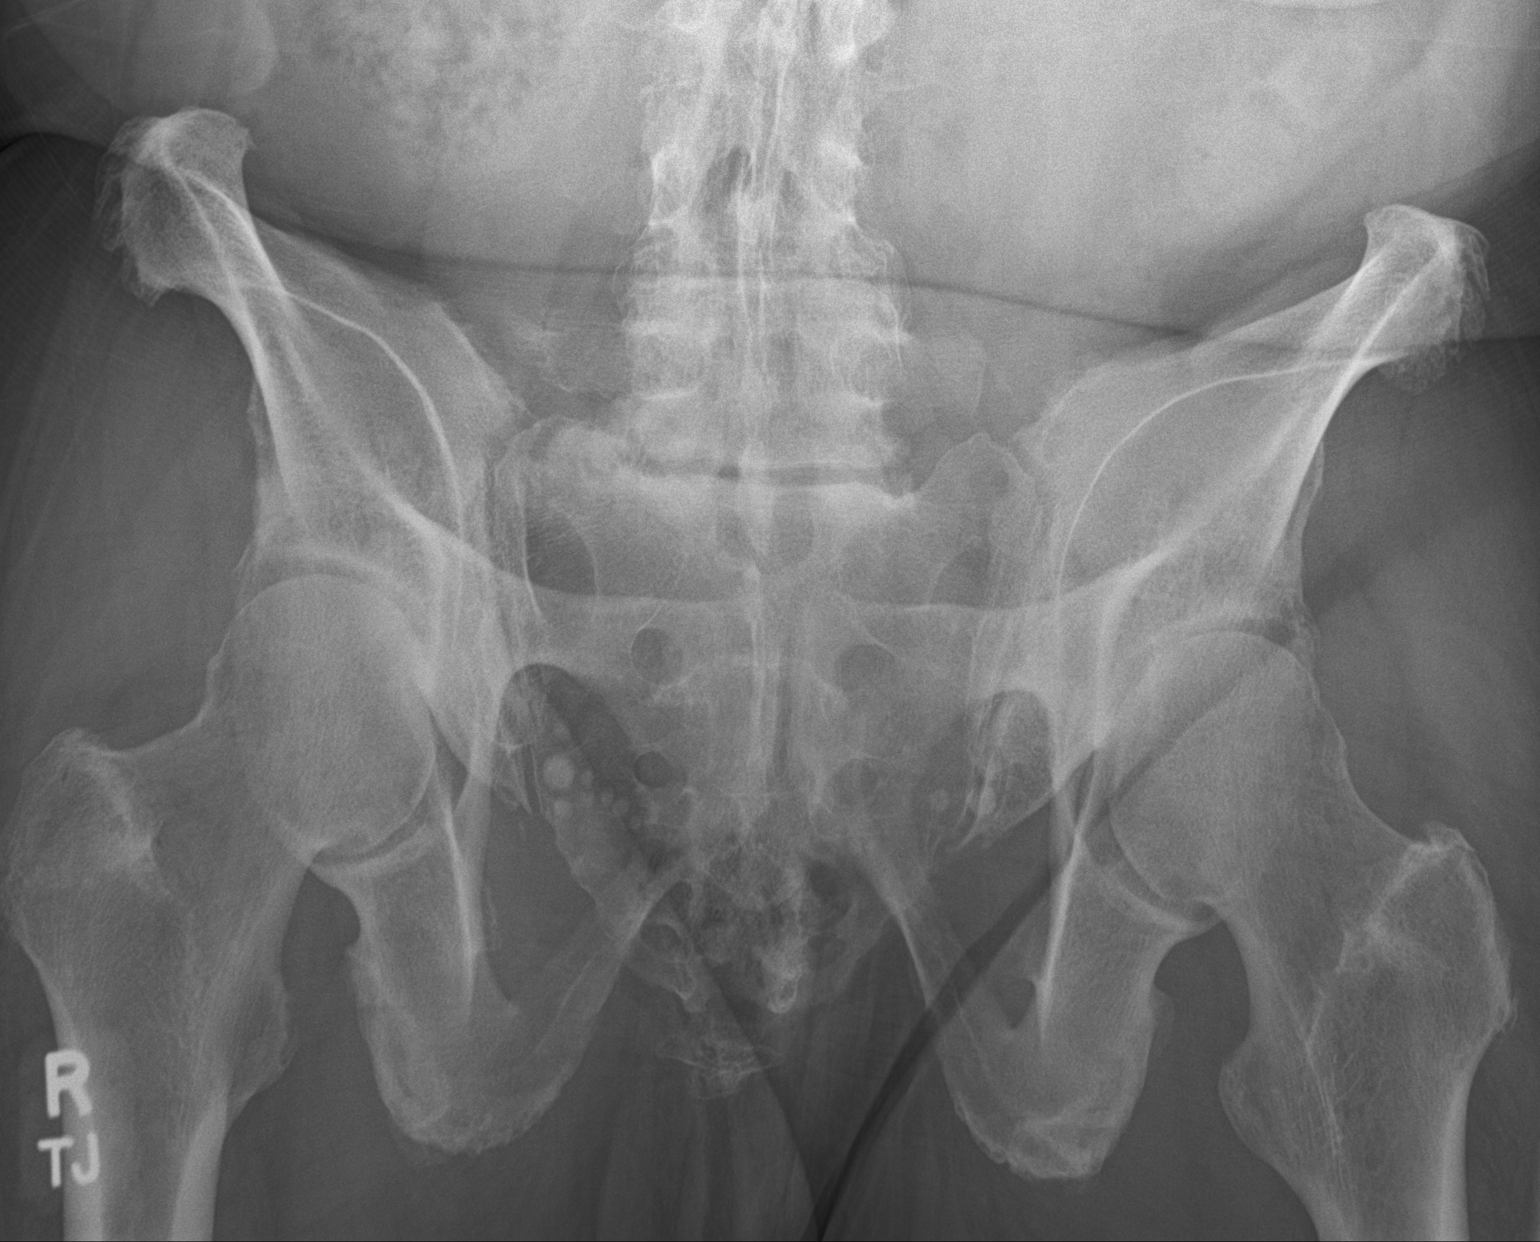

[3 of 3 positions shown; findings below may reference images not displayed]

FINDINGS: The sacroiliac joint spaces are maintained. Moderate severity
degenerative changes are seen involving both sacroiliac joints, in
the form of joint space narrowing and associated sclerosis. No acute
osseous abnormalities are identified.
IMPRESSION: Moderate severity degenerative changes, without an acute osseous
abnormality.

## 2022-12-03 ENCOUNTER — Other Ambulatory Visit: Payer: Self-pay | Admitting: Family Medicine

## 2022-12-03 DIAGNOSIS — K2100 Gastro-esophageal reflux disease with esophagitis, without bleeding: Secondary | ICD-10-CM

## 2022-12-03 NOTE — Telephone Encounter (Signed)
E-scribed refill  Pt scheduled for CPE lab visit on 12/22/22. Plz schedule CPE at least 1 wk after this date.

## 2022-12-03 NOTE — Telephone Encounter (Signed)
Lvmtcb, sent mychart message  

## 2022-12-10 ENCOUNTER — Encounter (INDEPENDENT_AMBULATORY_CARE_PROVIDER_SITE_OTHER): Payer: Self-pay

## 2022-12-20 ENCOUNTER — Other Ambulatory Visit: Payer: Self-pay | Admitting: Family Medicine

## 2022-12-20 DIAGNOSIS — N183 Chronic kidney disease, stage 3 unspecified: Secondary | ICD-10-CM

## 2022-12-20 DIAGNOSIS — E785 Hyperlipidemia, unspecified: Secondary | ICD-10-CM

## 2022-12-20 DIAGNOSIS — E559 Vitamin D deficiency, unspecified: Secondary | ICD-10-CM

## 2022-12-22 ENCOUNTER — Other Ambulatory Visit (INDEPENDENT_AMBULATORY_CARE_PROVIDER_SITE_OTHER): Payer: BC Managed Care – PPO

## 2022-12-22 DIAGNOSIS — E785 Hyperlipidemia, unspecified: Secondary | ICD-10-CM | POA: Diagnosis not present

## 2022-12-22 DIAGNOSIS — E559 Vitamin D deficiency, unspecified: Secondary | ICD-10-CM

## 2022-12-22 DIAGNOSIS — N183 Chronic kidney disease, stage 3 unspecified: Secondary | ICD-10-CM

## 2022-12-22 LAB — COMPREHENSIVE METABOLIC PANEL
ALT: 15 U/L (ref 0–35)
AST: 14 U/L (ref 0–37)
Albumin: 4.1 g/dL (ref 3.5–5.2)
Alkaline Phosphatase: 69 U/L (ref 39–117)
BUN: 13 mg/dL (ref 6–23)
CO2: 29 mEq/L (ref 19–32)
Calcium: 9.8 mg/dL (ref 8.4–10.5)
Chloride: 101 mEq/L (ref 96–112)
Creatinine, Ser: 1.2 mg/dL (ref 0.40–1.20)
GFR: 47.3 mL/min — ABNORMAL LOW (ref >60.00)
Glucose, Bld: 110 mg/dL — ABNORMAL HIGH (ref 70–99)
Potassium: 3.9 mEq/L (ref 3.5–5.1)
Sodium: 137 mEq/L (ref 135–145)
Total Bilirubin: 0.4 mg/dL (ref 0.2–1.2)
Total Protein: 7.5 g/dL (ref 6.0–8.3)

## 2022-12-22 LAB — CBC WITH DIFFERENTIAL/PLATELET
Basophils Absolute: 0 10*3/uL (ref 0.0–0.1)
Basophils Relative: 0.5 % (ref 0.0–3.0)
Eosinophils Absolute: 0.2 10*3/uL (ref 0.0–0.7)
Eosinophils Relative: 3.1 % (ref 0.0–5.0)
HCT: 39.6 % (ref 36.0–46.0)
Hemoglobin: 12.8 g/dL (ref 12.0–15.0)
Lymphocytes Relative: 40 % (ref 12.0–46.0)
Lymphs Abs: 2.2 10*3/uL (ref 0.7–4.0)
MCHC: 32.2 g/dL (ref 30.0–36.0)
MCV: 95.4 fl (ref 78.0–100.0)
Monocytes Absolute: 0.5 10*3/uL (ref 0.1–1.0)
Monocytes Relative: 9 % (ref 3.0–12.0)
Neutro Abs: 2.6 10*3/uL (ref 1.4–7.7)
Neutrophils Relative %: 47.4 % (ref 43.0–77.0)
Platelets: 227 10*3/uL (ref 150.0–400.0)
RBC: 4.15 Mil/uL (ref 3.87–5.11)
RDW: 13.5 % (ref 11.5–15.5)
WBC: 5.4 10*3/uL (ref 4.0–10.5)

## 2022-12-22 LAB — MICROALBUMIN / CREATININE URINE RATIO
Creatinine,U: 48 mg/dL
Microalb Creat Ratio: 1.5 mg/g (ref 0.0–30.0)
Microalb, Ur: <0.7 mg/dL (ref 0.0–1.9)

## 2022-12-22 LAB — LIPID PANEL
Cholesterol: 163 mg/dL (ref 0–200)
HDL: 43.1 mg/dL (ref >39.00)
LDL Cholesterol: 100 mg/dL — ABNORMAL HIGH (ref 0–99)
NonHDL: 120.06
Total CHOL/HDL Ratio: 4
Triglycerides: 101 mg/dL (ref 0.0–149.0)
VLDL: 20.2 mg/dL (ref 0.0–40.0)

## 2022-12-22 LAB — EXTRA SPECIMEN

## 2022-12-22 LAB — VITAMIN D 25 HYDROXY (VIT D DEFICIENCY, FRACTURES): VITD: 35.95 ng/mL (ref 30.00–100.00)

## 2022-12-25 NOTE — Addendum Note (Signed)
Addended by: Alvina Chou on: 12/25/2022 10:58 AM   Modules accepted: Orders

## 2022-12-29 ENCOUNTER — Ambulatory Visit: Payer: BC Managed Care – PPO | Admitting: Family Medicine

## 2022-12-29 ENCOUNTER — Encounter: Payer: Self-pay | Admitting: Family Medicine

## 2022-12-29 VITALS — BP 122/70 | HR 76 | Temp 97.6°F | Ht 65.0 in | Wt 208.8 lb

## 2022-12-29 DIAGNOSIS — N183 Chronic kidney disease, stage 3 unspecified: Secondary | ICD-10-CM | POA: Diagnosis not present

## 2022-12-29 DIAGNOSIS — E785 Hyperlipidemia, unspecified: Secondary | ICD-10-CM | POA: Diagnosis not present

## 2022-12-29 DIAGNOSIS — K2100 Gastro-esophageal reflux disease with esophagitis, without bleeding: Secondary | ICD-10-CM

## 2022-12-29 DIAGNOSIS — I10 Essential (primary) hypertension: Secondary | ICD-10-CM

## 2022-12-29 DIAGNOSIS — E559 Vitamin D deficiency, unspecified: Secondary | ICD-10-CM

## 2022-12-29 DIAGNOSIS — Z0000 Encounter for general adult medical examination without abnormal findings: Secondary | ICD-10-CM

## 2022-12-29 DIAGNOSIS — E669 Obesity, unspecified: Secondary | ICD-10-CM

## 2022-12-29 MED ORDER — FAMOTIDINE 20 MG PO TABS
20.0000 mg | ORAL_TABLET | Freq: Two times a day (BID) | ORAL | 4 refills | Status: DC
Start: 2022-12-29 — End: 2024-02-23

## 2022-12-29 MED ORDER — GABAPENTIN 300 MG PO CAPS: 300.0000 mg | ORAL_CAPSULE | Freq: Every day | ORAL | 4 refills | Status: DC

## 2022-12-29 MED ORDER — ATORVASTATIN CALCIUM 20 MG PO TABS: 20.0000 mg | ORAL_TABLET | Freq: Every day | ORAL | 4 refills | Status: DC

## 2022-12-29 MED ORDER — AMLODIPINE BESYLATE 5 MG PO TABS: 5.0000 mg | ORAL_TABLET | Freq: Every day | ORAL | 4 refills | Status: DC

## 2022-12-29 NOTE — Assessment & Plan Note (Signed)
Chronic, stable on vit D 1000 international units  daily.

## 2022-12-29 NOTE — Assessment & Plan Note (Signed)
Continue to encourage healthy diet and lifestyle choices.  Contrave was expensive, wegovy not covered.

## 2022-12-29 NOTE — Patient Instructions (Addendum)
Double check on latest tetanus.  If interested, check with pharmacy about new 2 shot shingles series (Shingrix) and RSV.  You do have chronic kidney disease over the past few years. Work on good blood pressure, sugar control, drink plenty of water, limit anti inflammatories like ibuprofen, aleve, advil.  Repeat labs today  Return as needed or in 1 year for next physical.

## 2022-12-29 NOTE — Progress Notes (Signed)
Ph: [phone] Fax: [phone]   Patient ID: Martha Wade, female    DOB: Sep 06, 1955, 67 y.o.   MRN: [id number]  This visit was conducted in person.  BP 122/70 (BP Location: Right Arm, Cuff Size: Large)   Pulse 76   Temp 97.6 F (36.4 C) (Temporal)   Ht 5\' 5"  (1.651 m)   Wt 208 lb 12.8 oz (94.7 kg)   LMP 11/20/2015   SpO2 95%   BMI 34.75 kg/m   BP Readings from Last 3 Encounters:  12/29/22 122/70  11/04/22 (!) 144/88  09/24/22 110/60   CC: CPE Subjective:   HPI: Martha Wade is a 67 y.o. female presenting on 12/29/2022 for Annual Exam   Does not have medicare - works for [employer]. Considering retirine next year.   Continues caring for siblings - one brother with Alzheimer's, other brother had stroke last year.   Skin lesion removed last week - was wart.   Obesity - Contrave started 10/2022 - this was unaffordable.    Preventative: COLONOSCOPY WITH ESOPHAGOGASTRODUODENOSCOPY (EGD) 07/2018 - colon - int hem, rpt 5 yrs, EGD - reflux esophagitis Mechele Collin) Well woman - last pap 09/2015 - WNL except endocervical zone absent. She sees [facility], last seen 2019 for PMB, due for f/u.   Mammogram - 12/2021 Bir[facility] @ [facility] - will call to scedule DEXA 11/2012 - WNL  Lung cancer screening - not eligible  Flu shot - declines  COVID vaccine Pfizer 07/2019, 08/2019, booster 02/2020 Tdap 12/2012  Shingrix - discussed  RSV - discussed Seat belt use discussed Sunscreen use discussed, no changing moles on skin.  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 Interim medical history since our last visit reviewed. Allergies and medications reviewed and updated. Outpatient Medications Prior to Visit  Medication Sig Dispense Refill   acetaminophen (TYLENOL) 500 MG tablet Take 1,000 mg by mouth daily.     brimonidine (ALPHAGAN) 0.2 % ophthalmic solution      Cholecalciferol (VITAMIN D-3) 1000 UNITS CAPS Take 1,000 Units by mouth daily.     Coenzyme Q10 (COQ-10) 100 MG CAPS Take 100 mg by mouth daily.     cyanocobalamin 500 MCG tablet Take 500 mcg by mouth daily.      diclofenac Sodium (VOLTAREN) 1 % GEL Apply 1 Application topically 3 (three) times daily. 100 g 1   dorzolamide (TRUSOPT) 2 % ophthalmic solution PLACE 1 DROP INTO BOTH EYES TWICE A DAY  5   latanoprost (XALATAN) 0.005 % ophthalmic solution   4   methocarbamol (ROBAXIN) 500 MG tablet TAKE 1 TABLET(500 MG) BY MOUTH TWICE DAILY AS NEEDED FOR MUSCLE SPASMS 30 tablet 3   Omega-3 Fatty Acids (FISH OIL) 1000 MG CAPS Take 1 capsule by mouth daily.     timolol (TIMOPTIC) 0.5 % ophthalmic solution   4   amLODipine (NORVASC) 5 MG tablet Take 1 tablet (5 mg total) by mouth daily. 90 tablet 3   atorvastatin (LIPITOR) 20 MG tablet Take 1 tablet (  20 mg total) by mouth daily. 90 tablet 3   famotidine (PEPCID) 20 MG tablet TAKE 1 TABLET(20 MG) BY MOUTH TWICE DAILY 180 tablet 0   gabapentin (NEURONTIN) 300 MG capsule Take 1 capsule (300 mg total) by mouth at bedtime. 90 capsule 0   Naltrexone-buPROPion HCl ER (CONTRAVE) 8-90 MG TB12 Start 1 tablet every morning for 7 days, then 1 tablet twice daily for 7 days, then 2 tablets every morning and one every evening (Patient not taking: Reported on 12/29/2022) 120 tablet 0   No facility-administered medications prior to visit.     Per HPI unless specifically indicated in ROS section below Review of Systems  Constitutional:  Negative for activity change, appetite  change, chills, fatigue, fever and unexpected weight change.  HENT:  Negative for hearing loss.   Eyes:  Negative for visual disturbance.  Respiratory:  Negative for cough, chest tightness, shortness of breath and wheezing.   Cardiovascular:  Negative for chest pain, palpitations and leg swelling.  Gastrointestinal:  Negative for abdominal distention, abdominal pain, blood in stool, constipation, diarrhea, nausea and vomiting.  Genitourinary:  Negative for difficulty urinating and hematuria.  Musculoskeletal:  Negative for arthralgias, myalgias and neck pain.  Skin:  Negative for rash.  Neurological:  Negative for dizziness, seizures, syncope and headaches.  Hematological:  Negative for adenopathy. Does not bruise/bleed easily.  Psychiatric/Behavioral:  Negative for dysphoric mood. The patient is not nervous/anxious.     Objective:  BP 122/70 (BP Location: Right Arm, Cuff Size: Large)   Pulse 76   Temp 97.6 F (36.4 C) (Temporal)   Ht 5\' 5"  (1.651 m)   Wt 208 lb 12.8 oz (94.7 kg)   LMP 11/20/2015   SpO2 95%   BMI 34.75 kg/m   Wt Readings from Last 3 Encounters:  12/29/22 208 lb 12.8 oz (94.7 kg)  11/04/22 211 lb 4 oz (95.8 kg)  09/24/22 211 lb 6 oz (95.9 kg)      Physical Exam Vitals and nursing note reviewed.  Constitutional:      Appearance: Normal appearance. She is not ill-appearing.  HENT:     Head: Normocephalic and atraumatic.     Right Ear: Tympanic membrane, ear canal and external ear normal. There is no impacted cerumen.     Left Ear: Tympanic membrane, ear canal and external ear normal. There is no impacted cerumen.  Eyes:     General:        Right eye: No discharge.        Left eye: No discharge.     Extraocular Movements: Extraocular movements intact.     Conjunctiva/sclera: Conjunctivae normal.     Pupils: Pupils are equal, round, and reactive to light.  Neck:     Thyroid: No thyroid mass or thyromegaly.  Cardiovascular:     Rate and Rhythm: Normal rate  and regular rhythm.     Pulses: Normal pulses.     Heart sounds: Normal heart sounds. No murmur heard. Pulmonary:     Effort: Pulmonary effort is normal. No respiratory distress.     Breath sounds: Normal breath sounds. No wheezing, rhonchi or rales.  Abdominal:     General: Bowel sounds are normal. There is no distension.     Palpations: Abdomen is soft. There is no mass.     Tenderness: There is no abdominal tenderness. There is no guarding or rebound.     Hernia: No hernia is present.  Musculoskeletal:     Cervical back: Normal range  of motion and neck supple. No rigidity.     Right lower leg: No edema.     Left lower leg: No edema.  Lymphadenopathy:     Cervical: No cervical adenopathy.  Skin:    General: Skin is warm and dry.     Findings: No rash.  Neurological:     General: No focal deficit present.     Mental Status: She is alert. Mental status is at baseline.  Psychiatric:        Mood and Affect: Mood normal.        Behavior: Behavior normal.       Results for orders placed or performed in visit on 12/22/22  Microalbumin / creatinine urine ratio  Result Value Ref Range   Microalb, Ur <0.7 0.0 - 1.9 mg/dL   Creatinine,U 16.1 mg/dL   Microalb Creat Ratio 1.5 0.0 - 30.0 mg/g  CBC with Differential/Platelet  Result Value Ref Range   WBC 5.4 4.0 - 10.5 K/uL   RBC 4.15 3.87 - 5.11 Mil/uL   Hemoglobin 12.8 12.0 - 15.0 g/dL   HCT 09.6 04.5 - 40.9 %   MCV 95.4 78.0 - 100.0 fl   MCHC 32.2 30.0 - 36.0 g/dL   RDW 81.1 91.4 - 78.2 %   Platelets 227.0 150.0 - 400.0 K/uL   Neutrophils Relative % 47.4 43.0 - 77.0 %   Lymphocytes Relative 40.0 12.0 - 46.0 %   Monocytes Relative 9.0 3.0 - 12.0 %   Eosinophils Relative 3.1 0.0 - 5.0 %   Basophils Relative 0.5 0.0 - 3.0 %   Neutro Abs 2.6 1.4 - 7.7 K/uL   Lymphs Abs 2.2 0.7 - 4.0 K/uL   Monocytes Absolute 0.5 0.1 - 1.0 K/uL   Eosinophils Absolute 0.2 0.0 - 0.7 K/uL   Basophils Absolute 0.0 0.0 - 0.1 K/uL  VITAMIN D 25  Hydroxy (Vit-D Deficiency, Fractures)  Result Value Ref Range   VITD 35.95 30.00 - 100.00 ng/mL  Comprehensive metabolic panel  Result Value Ref Range   Sodium 137 135 - 145 mEq/L   Potassium 3.9 3.5 - 5.1 mEq/L   Chloride 101 96 - 112 mEq/L   CO2 29 19 - 32 mEq/L   Glucose, Bld 110 (H) 70 - 99 mg/dL   BUN 13 6 - 23 mg/dL   Creatinine, Ser 9.56 0.40 - 1.20 mg/dL   Total Bilirubin 0.4 0.2 - 1.2 mg/dL   Alkaline Phosphatase 69 39 - 117 U/L   AST 14 0 - 37 U/L   ALT 15 0 - 35 U/L   Total Protein 7.5 6.0 - 8.3 g/dL   Albumin 4.1 3.5 - 5.2 g/dL   GFR 21.30 (L) >86.57 mL/min   Calcium 9.8 8.4 - 10.5 mg/dL  Lipid panel  Result Value Ref Range   Cholesterol 163 0 - 200 mg/dL   Triglycerides 846.9 0.0 - 149.0 mg/dL   HDL 62.95 >28.41 mg/dL   VLDL 32.4 0.0 - 40.1 mg/dL   LDL Cholesterol 027 (H) 0 - 99 mg/dL   Total CHOL/HDL Ratio 4    NonHDL 120.06   Extra Specimen  Result Value Ref Range   Extra tube recieved     Specimen type recieved Serum Separator (SST)     Assessment & Plan:   Problem List Items Addressed This Visit     Health maintenance examination - Primary (Chronic)    Preventative protocols reviewed and updated unless pt declined. Discussed healthy diet and lifestyle.  Vitamin D deficiency    Chronic, stable on vit D 1000 international units  daily.       Obesity, Class I, BMI 30-34.9    Continue to encourage healthy diet and lifestyle choices.  Contrave was expensive, wegovy not covered.       Essential hypertension    BP overall stable on amlodipine 5mg  daily. Consider ACEI/ARB for renoprotection.       Relevant Medications   amLODipine (NORVASC) 5 MG tablet   atorvastatin (LIPITOR) 20 MG tablet   HLD (hyperlipidemia)    Chronic, stable on atorvastatin 20mg  daily - continue. The 10-year ASCVD risk score (Arnett DK, et al., 2019) is: 7.5%   Values used to calculate the score:     Age: 89 years     Sex: Female     Is Non-Hispanic African  American: Yes     Diabetic: No     Tobacco smoker: No     Systolic Blood Pressure: 122 mmHg     Is BP treated: Yes     HDL Cholesterol: 43.1 mg/dL     Total Cholesterol: 163 mg/dL       Relevant Medications   amLODipine (NORVASC) 5 MG tablet   atorvastatin (LIPITOR) 20 MG tablet   CKD (chronic kidney disease) stage 3, GFR 30-59 ml/min (HCC)    Reviewed with patient.  Encourage tight BP and sugar control, good hydration status, limit NSAIDs and other nephrotoxic agents. Update PTH, SPEP.       Relevant Orders   Serum protein electrophoresis with reflex   Gastroesophageal reflux disease with esophagitis    Continue pepcid 20mg  daily.  Avoid PPI in CKD hx      Relevant Medications   famotidine (PEPCID) 20 MG tablet     Meds ordered this encounter  Medications   amLODipine (NORVASC) 5 MG tablet    Sig: Take 1 tablet (5 mg total) by mouth daily.    Dispense:  90 tablet    Refill:  4   atorvastatin (LIPITOR) 20 MG tablet    Sig: Take 1 tablet (20 mg total) by mouth daily.    Dispense:  90 tablet    Refill:  4   famotidine (PEPCID) 20 MG tablet    Sig: Take 1 tablet (20 mg total) by mouth 2 (two) times daily.    Dispense:  180 tablet    Refill:  4   gabapentin (NEURONTIN) 300 MG capsule    Sig: Take 1 capsule (300 mg total) by mouth at bedtime.    Dispense:  90 capsule    Refill:  4    Orders Placed This Encounter  Procedures   Serum protein electrophoresis with reflex    Patient Instructions  Double check on latest tetanus.  If interested, check with pharmacy about new 2 shot shingles series (Shingrix) and RSV.  You do have chronic kidney disease over the past few years. Work on good blood pressure, sugar control, drink plenty of water, limit anti inflammatories like ibuprofen, aleve, advil.  Repeat labs today  Return as needed or in 1 year for next physical.   Follow up plan: Return in about 1 year (around 12/29/2023), or if symptoms worsen or fail to improve,  for annual exam, prior fasting for blood work.  Eustaquio Boyden, MD

## 2022-12-29 NOTE — Assessment & Plan Note (Signed)
Chronic, stable on atorvastatin 20mg  daily - continue. The 10-year ASCVD risk score (Arnett DK, et al., 2019) is: 7.5%   Values used to calculate the score:     Age: 67 years     Sex: Female     Is Non-Hispanic African American: Yes     Diabetic: No     Tobacco smoker: No     Systolic Blood Pressure: 122 mmHg     Is BP treated: Yes     HDL Cholesterol: 43.1 mg/dL     Total Cholesterol: 163 mg/dL

## 2022-12-29 NOTE — Assessment & Plan Note (Signed)
Preventative protocols reviewed and updated unless pt declined. Discussed healthy diet and lifestyle.  

## 2022-12-29 NOTE — Assessment & Plan Note (Signed)
BP overall stable on amlodipine 5mg  daily. Consider ACEI/ARB for renoprotection.

## 2022-12-29 NOTE — Assessment & Plan Note (Addendum)
Reviewed with patient.  Encourage tight BP and sugar control, good hydration status, limit NSAIDs and other nephrotoxic agents. Update PTH, SPEP.

## 2022-12-29 NOTE — Assessment & Plan Note (Signed)
Continue pepcid 20mg  daily.  Avoid PPI in CKD hx

## 2022-12-30 LAB — PARATHYROID HORMONE, INTACT (NO CA): PTH: 21 pg/mL (ref 16–77)

## 2023-01-08 ENCOUNTER — Encounter: Payer: Self-pay | Admitting: Family Medicine

## 2023-01-08 DIAGNOSIS — D472 Monoclonal gammopathy: Secondary | ICD-10-CM | POA: Insufficient documentation

## 2023-01-08 LAB — PROTEIN ELECTROPHORESIS, SERUM, WITH REFLEX
Abnormal Protein Band1: 0.7 g/dL — ABNORMAL HIGH
Albumin ELP: 3.9 g/dL (ref 3.8–4.8)
Alpha 1: 0.3 g/dL (ref 0.2–0.3)
Alpha 2: 0.6 g/dL (ref 0.5–0.9)
Beta 2: 0.6 g/dL — ABNORMAL HIGH (ref 0.2–0.5)
Beta Globulin: 0.4 g/dL (ref 0.4–0.6)
Gamma Globulin: 1.5 g/dL (ref 0.8–1.7)
Total Protein: 7.4 g/dL (ref 6.1–8.1)

## 2023-01-08 LAB — IFE INTERPRETATION

## 2023-01-09 ENCOUNTER — Other Ambulatory Visit: Payer: Self-pay | Admitting: Family Medicine

## 2023-01-09 ENCOUNTER — Encounter: Payer: Self-pay | Admitting: Family Medicine

## 2023-01-09 DIAGNOSIS — D472 Monoclonal gammopathy: Secondary | ICD-10-CM

## 2023-01-09 DIAGNOSIS — N183 Chronic kidney disease, stage 3 unspecified: Secondary | ICD-10-CM

## 2023-01-16 ENCOUNTER — Inpatient Hospital Stay: Payer: BC Managed Care – PPO

## 2023-01-16 ENCOUNTER — Encounter: Payer: Self-pay | Admitting: Oncology

## 2023-01-16 ENCOUNTER — Inpatient Hospital Stay: Payer: BC Managed Care – PPO | Attending: Oncology | Admitting: Oncology

## 2023-01-16 VITALS — BP 135/88 | HR 87 | Temp 97.3°F | Resp 18 | Ht 65.0 in | Wt 204.3 lb

## 2023-01-16 DIAGNOSIS — D472 Monoclonal gammopathy: Secondary | ICD-10-CM | POA: Insufficient documentation

## 2023-01-16 DIAGNOSIS — N183 Chronic kidney disease, stage 3 unspecified: Secondary | ICD-10-CM | POA: Diagnosis not present

## 2023-01-16 DIAGNOSIS — R778 Other specified abnormalities of plasma proteins: Secondary | ICD-10-CM

## 2023-01-16 NOTE — Progress Notes (Unsigned)
Hematology/Oncology Consult note [facility] [phone] [phone]  Patient Care Team: Eustaquio Boyden, MD as PCP - General (Family Medicine) Lemar Livings Merrily Pew, MD (General Surgery)   Name of the patient: Martha Wade  [id number]  07-09-1955    Reason for referral-abnormal SPEP   Referring physician-Dr. Sharen Hones  Date of visit: 01/16/23   History of presenting illness- Patient is a 67 year old African-American female who underwent blood tests by her primary care doctor Dr. Sharen Hones.  Her CBC in August 2024 showed a normal H&H of 12.8/29.6.  White count and platelet counts were normal.  Serum creatinine at 1.2.  Total calcium normal at 9.8.  She had SPEP which showed possible M spike in the gamma globulin region which was identified as IgG kappa.  SPEP was likely ordered due to mildly elevated creatinine of 1.2  ECOG PS- 1  Pain scale- 0   Review of systems- Review of Systems  Constitutional:  Negative for chills, fever, malaise/fatigue and weight loss.  HENT:  Negative for congestion, ear discharge and nosebleeds.   Eyes:  Negative for blurred vision.  Respiratory:  Negative for cough, hemoptysis, sputum production, shortness of breath and wheezing.   Cardiovascular:  Negative for chest pain, palpitations, orthopnea and claudication.  Gastrointestinal:  Negative for abdominal pain, blood in stool, constipation, diarrhea, heartburn, melena, nausea and vomiting.  Genitourinary:  Negative for dysuria, flank pain, frequency, hematuria and urgency.  Musculoskeletal:  Negative for back pain, joint pain and myalgias.  Skin:  Negative for rash.  Neurological:  Negative for dizziness, tingling, focal weakness, seizures, weakness and headaches.  Endo/Heme/Allergies:  Does not bruise/bleed easily.  Psychiatric/Behavioral:  Negative for depression and suicidal ideas. The patient does not have insomnia.     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   Skin lesion of cheek 11/05/2022   Trigger thumb of right hand 05/20/2022   Right shoulder pain 11/08/2021   Chest pain at rest 07/04/2021   Chronic right sacroiliac pain 10/30/2020   Trochanteric bursitis of right hip 02/10/2020   Gastroesophageal reflux disease with esophagitis 05/21/2018   Post-menopausal bleeding 10/16/2017   Onychomycosis of toenail 10/16/2017   Low back pain radiating to right lower extremity 08/21/2017   CKD (chronic kidney disease) stage 3, GFR 30-59 ml/min (HCC) 10/09/2015   Pain of left thumb 09/28/2015   Health maintenance examination 09/18/2014   Essential hypertension 09/18/2014   HLD (hyperlipidemia) 09/18/2014   Radiculitis of left cervical region 09/18/2014   Obesity, Class I, BMI 30-34.9    Vitamin D deficiency 09/13/2014   Myeloradiculopathy 06/08/2013   Left knee pain 12/24/2012     Past Medical History:  Diagnosis Date   Cervical radiculopathy    [place]   Chronic kidney disease    RENAL INSUFF   CTS (carpal tunnel syndrome)    bilateral   Glaucoma    early stages (Dr. Inez Pilgrim and Dr. Alvester Morin)   History of measles    History of mumps    Hypertension    Obesity (BMI 30.0-34.9)    Umbilical hernia 2017   s/p repair     Past Surgical History:  Procedure Laterality Date   ANTERIOR CERVICAL DECOMP/DISCECTOMY FUSION N/A 06/08/2013   ANTERIOR CERVICAL DECOMPRESSION/DISCECTOMY FUSION 3 LEVELS;  Surgeon: Emilee Hero, MD;  Anterior cervical decompression fusion with  instrumentation and allograft (C4-7)   CATARACT EXTRACTION, BILATERAL  05/2017   CHOLECYSTECTOMY N/A 02/28/2016   Procedure: LAPAROSCOPIC CHOLECYSTECTOMY WITH INTRAOPERATIVE CHOLANGIOGRAM;  Surgeon: Earline Mayotte, MD;  Location: [facility] ORS;  Service: General;  Laterality: N/A;   COLONOSCOPY   03/2013   int hem, 3 polyps no bx result available Mechele Collin)   COLONOSCOPY WITH ESOPHAGOGASTRODUODENOSCOPY (EGD)  07/2018   colon - int hem, rpt 5 yrs, EGD - reflux esophagitis Mechele Collin)   DEXA  11/2012   WNL   GLAUCOMA SURGERY Left    laser (Sun)   UMBILICAL HERNIA REPAIR N/A 02/28/2016   Procedure: PRIMARY REPAIR VENTRAL/ UMBILICAL ADULT;  Surgeon: Earline Mayotte, MD;  Location: [facility] ORS;  Service: General;  Laterality: N/A;    Social History   Socioeconomic History   Marital status: Married    Spouse name: Not on file   Number of children: Not on file   Years of education: Not on file   Highest education level: Not on file  Occupational History   Not on file  Tobacco Use   Smoking status: Never   Smokeless tobacco: Never  Vaping Use   Vaping status: Never Used  Substance and Sexual Activity   Alcohol use: No    Alcohol/week: 0.0 standard drinks of alcohol   Drug use: No   Sexual activity: Not Currently  Other Topics Concern   Not on file  Social History Narrative   Lives with Ginger Carne   G0   Married   Occupation: dispatcher   Edu: college   Activity: no regular exercise, does have treadmill and weight lifting bench   Diet: increasing water, fruits/vegetables daily   Social Determinants of Health   Financial Resource Strain: Not on file  Food Insecurity: Not on file  Transportation Needs: Not on file  Physical Activity: Not on file  Stress: Not on file  Social Connections: Not on file  Intimate Partner Violence: Not on file     Family History  Problem Relation Age of Onset   Glaucoma Mother    CAD Mother    Diabetes Mother    Stroke Mother    Cancer Father        unsure type   Hypertension Brother        and sister     Current Outpatient Medications:    acetaminophen (TYLENOL) 500 MG tablet, Take 1,000 mg by mouth daily., Disp: , Rfl:    amLODipine (NORVASC) 5 MG tablet, Take 1 tablet (5 mg total) by mouth daily., Disp: 90 tablet, Rfl: 4    atorvastatin (LIPITOR) 20 MG tablet, Take 1 tablet (20 mg total) by mouth daily., Disp: 90 tablet, Rfl: 4   Cholecalciferol (VITAMIN D-3) 1000 UNITS CAPS, Take 1,000 Units by mouth daily., Disp: , Rfl:    Coenzyme Q10 (COQ-10) 100 MG CAPS, Take 100 mg by mouth daily., Disp: , Rfl:    cyanocobalamin 500 MCG tablet, Take 500 mcg by mouth daily. , Disp: , Rfl:    diclofenac Sodium (VOLTAREN) 1 % GEL, Apply 1 Application topically 3 (three) times daily., Disp: 100 g, Rfl: 1   famotidine (PEPCID) 20 MG tablet, Take 1 tablet (20 mg total) by mouth 2 (two) times daily., Disp: 180 tablet, Rfl: 4   gabapentin (NEURONTIN) 300 MG capsule, Take 1 capsule (300 mg total) by mouth at bedtime., Disp: 90 capsule, Rfl: 4   latanoprost (XALATAN) 0.005 % ophthalmic solution, , Disp: , Rfl: 4   methocarbamol (ROBAXIN)  500 MG tablet, TAKE 1 TABLET(500 MG) BY MOUTH TWICE DAILY AS NEEDED FOR MUSCLE SPASMS, Disp: 30 tablet, Rfl: 3   Omega-3 Fatty Acids (FISH OIL) 1000 MG CAPS, Take 1 capsule by mouth daily., Disp: , Rfl:    brimonidine (ALPHAGAN) 0.2 % ophthalmic solution, , Disp: , Rfl:    dorzolamide (TRUSOPT) 2 % ophthalmic solution, PLACE 1 DROP INTO BOTH EYES TWICE A DAY (Patient not taking: Reported on 01/16/2023), Disp: , Rfl: 5   Naltrexone-buPROPion HCl ER (CONTRAVE) 8-90 MG TB12, Start 1 tablet every morning for 7 days, then 1 tablet twice daily for 7 days, then 2 tablets every morning and one every evening (Patient not taking: Reported on 12/29/2022), Disp: 120 tablet, Rfl: 0   timolol (TIMOPTIC) 0.5 % ophthalmic solution, , Disp: , Rfl: 4   Physical exam:  Vitals:   01/16/23 1444  BP: 135/88  Pulse: 87  Resp: 18  Temp: (!) 97.3 F (36.3 C)  TempSrc: Tympanic  SpO2: 100%  Weight: 204 lb 4.8 oz (92.7 kg)  Height: 5\' 5"  (1.651 m)   Physical Exam Cardiovascular:     Rate and Rhythm: Normal rate and regular rhythm.     Heart sounds: Normal heart sounds.  Pulmonary:     Effort: Pulmonary effort is  normal.     Breath sounds: Normal breath sounds.  Abdominal:     General: Bowel sounds are normal.     Palpations: Abdomen is soft.  Skin:    General: Skin is warm and dry.  Neurological:     Mental Status: She is alert and oriented to person, place, and time.           Latest Ref Rng & Units 12/29/2022    1:11 PM  CMP  Total Protein 6.1 - 8.1 g/dL 7.4       Latest Ref Rng & Units 12/22/2022    7:52 AM  CBC  WBC 4.0 - 10.5 K/uL 5.4   Hemoglobin 12.0 - 15.0 g/dL 16.1   Hematocrit 09.6 - 46.0 % 39.6   Platelets 150.0 - 400.0 K/uL 227.0     Assessment and plan- Patient is a 67 y.o. female referred for abnormal SPEP  Abnormal SPEP was found as a part of workup for CKD.She was found to have IgG kappa monoclonal protein on immunofixation.  No evidence of hypercalcemia or anemia but she has stage III CKD and her creatinine is around 1.2.  It is unclear if this is related to her plasma cell dyscrasia or not.  I am checking a myeloma panel and serum free light chains as well as 24-hour urine protein electrophoresis at this time.  I will see her back in 2 weeks to discuss the results of lab work  Discussed differences between monoclonal gammopathy of undetermined significance] MGUS versus smoldering multiple myeloma versus overt multiple myeloma.  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   Thank you for this kind referral and the opportunity to participate in the care of this  Patient   Visit Diagnosis 1. Abnormal SPEP     Dr. Owens Shark, MD, MPH [facility] [phone] 01/16/2023

## 2023-01-20 ENCOUNTER — Other Ambulatory Visit: Payer: Self-pay

## 2023-01-20 DIAGNOSIS — D472 Monoclonal gammopathy: Secondary | ICD-10-CM | POA: Diagnosis not present

## 2023-01-20 DIAGNOSIS — R778 Other specified abnormalities of plasma proteins: Secondary | ICD-10-CM

## 2023-01-20 LAB — KAPPA/LAMBDA LIGHT CHAINS
Kappa free light chain: 28.7 mg/L — ABNORMAL HIGH (ref 3.3–19.4)
Kappa, lambda light chain ratio: 2.13 — ABNORMAL HIGH (ref 0.26–1.65)
Lambda free light chains: 13.5 mg/L (ref 5.7–26.3)

## 2023-01-21 LAB — MULTIPLE MYELOMA PANEL, SERUM
Albumin SerPl Elph-Mcnc: 4 g/dL (ref 2.9–4.4)
Albumin/Glob SerPl: 1 (ref 0.7–1.7)
Alpha 1: 0.2 g/dL (ref 0.0–0.4)
Alpha2 Glob SerPl Elph-Mcnc: 0.7 g/dL (ref 0.4–1.0)
B-Globulin SerPl Elph-Mcnc: 1.4 g/dL — ABNORMAL HIGH (ref 0.7–1.3)
Gamma Glob SerPl Elph-Mcnc: 1.9 g/dL — ABNORMAL HIGH (ref 0.4–1.8)
Globulin, Total: 4.2 g/dL — ABNORMAL HIGH (ref 2.2–3.9)
IgA: 584 mg/dL — ABNORMAL HIGH (ref 87–352)
IgG (Immunoglobin G), Serum: 1799 mg/dL — ABNORMAL HIGH (ref 586–1602)
IgM (Immunoglobulin M), Srm: 84 mg/dL (ref 26–217)
M Protein SerPl Elph-Mcnc: 0.9 g/dL — ABNORMAL HIGH
Total Protein ELP: 8.2 g/dL (ref 6.0–8.5)

## 2023-01-22 LAB — UPEP/TP, 24-HR URINE
Albumin, U: 100 %
Alpha 1, Urine: 0 %
Alpha 2, Urine: 0 %
Beta, Urine: 0 %
Gamma Globulin, Urine: 0 %
Total Protein, Urine-Ur/day: 105 mg/(24.h) (ref 30–150)
Total Protein, Urine: 5 mg/dL
Total Volume: 2100

## 2023-01-30 ENCOUNTER — Other Ambulatory Visit: Payer: Self-pay | Admitting: Family Medicine

## 2023-02-02 ENCOUNTER — Inpatient Hospital Stay: Payer: BC Managed Care – PPO | Admitting: Oncology

## 2023-02-02 ENCOUNTER — Encounter: Payer: Self-pay | Admitting: Oncology

## 2023-02-02 VITALS — BP 124/85 | HR 80 | Temp 97.0°F | Resp 18 | Ht 65.0 in | Wt 206.3 lb

## 2023-02-02 DIAGNOSIS — D472 Monoclonal gammopathy: Secondary | ICD-10-CM | POA: Diagnosis not present

## 2023-02-02 NOTE — Progress Notes (Signed)
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 Sharen Hones. Her CBC in August 2024 showed a normal H&H of 12.8/29.6. White count and platelet counts were normal. Serum creatinine at 1.2. Total calcium normal at 9.8. She had SPEP which showed possible M spike in the gamma globulin region which was identified as IgG kappa. SPEP was likely ordered due to mildly elevated creatinine of 1.2   Results of blood work from 01/16/2023 were as follows: 24-hour urine protein electrophoresis did not reveal any evidence of M protein.  Myeloma panel showed elevated IgG level of 1799 with a 0.9 IgG kappa light chain protein.  Serum kappa was mildly elevated at 28.7 with a free light chain ratio of 2.13.  Interval history-no acute changes since last visit.  Overall patient is doing well.  Appetite and weight have remained stable and denies any unintentional weight loss or new aches and pains anywhere  ECOG PS- 1 Pain scale- 0   Review of systems- Review of Systems  Constitutional:  Negative for chills, fever, malaise/fatigue and weight loss.  HENT:  Negative for congestion, ear discharge and nosebleeds.   Eyes:  Negative for blurred vision.  Respiratory:  Negative for cough, hemoptysis, sputum production, shortness of breath and wheezing.   Cardiovascular:  Negative for chest pain, palpitations, orthopnea and claudication.  Gastrointestinal:   Negative for abdominal pain, blood in stool, constipation, diarrhea, heartburn, melena, nausea and vomiting.  Genitourinary:  Negative for dysuria, flank pain, frequency, hematuria and urgency.  Musculoskeletal:  Negative for back pain, joint pain and myalgias.  Skin:  Negative for rash.  Neurological:  Negative for dizziness, tingling, focal weakness, seizures, weakness and headaches.  Endo/Heme/Allergies:  Does not bruise/bleed easily.  Psychiatric/Behavioral:  Negative for depression and suicidal ideas. The patient does not have insomnia.       Allergies  Allergen Reactions   Aleve [Naproxen Sodium] Hives, Itching and Swelling   Citrus Swelling     Past Medical History:  Diagnosis Date   Cervical radiculopathy    [place]   Chronic kidney disease    RENAL INSUFF   CTS (carpal tunnel syndrome)    bilateral   Glaucoma    early stages (Dr. Inez Pilgrim and Dr. Alvester Morin)   History of measles    History of mumps    Hypertension    Obesity (BMI 30.0-34.9)    Umbilical hernia 2017   s/p repair     Past Surgical History:  Procedure Laterality Date   ANTERIOR CERVICAL DECOMP/DISCECTOMY FUSION N/A 06/08/2013   ANTERIOR CERVICAL DECOMPRESSION/DISCECTOMY FUSION 3 LEVELS;  Surgeon: Emilee Hero, MD;  Anterior cervical decompression fusion with instrumentation and allograft (C4-7)   CATARACT EXTRACTION, BILATERAL  05/2017   CHOLECYSTECTOMY N/A 02/28/2016   Procedure: LAPAROSCOPIC CHOLECYSTECTOMY WITH INTRAOPERATIVE CHOLANGIOGRAM;  Surgeon: Earline Mayotte, MD;  Location: [facility] ORS;  Service: General;  Laterality: N/A;   COLONOSCOPY  03/2013   int hem, 3 polyps no bx result available Mechele Collin)   COLONOSCOPY WITH ESOPHAGOGASTRODUODENOSCOPY (  EGD)  07/2018   colon - int hem, rpt 5 yrs, EGD - reflux esophagitis Mechele Collin)   DEXA  11/2012   WNL   GLAUCOMA SURGERY Left    laser (Sun)   UMBILICAL HERNIA REPAIR N/A 02/28/2016   Procedure: PRIMARY REPAIR VENTRAL/ UMBILICAL ADULT;   Surgeon: Earline Mayotte, MD;  Location: [facility] ORS;  Service: General;  Laterality: N/A;    Social History   Socioeconomic History   Marital status: Married    Spouse name: Not on file   Number of children: Not on file   Years of education: Not on file   Highest education level: Not on file  Occupational History   Not on file  Tobacco Use   Smoking status: Never   Smokeless tobacco: Never  Vaping Use   Vaping status: Never Used  Substance and Sexual Activity   Alcohol use: No    Alcohol/week: 0.0 standard drinks of alcohol   Drug use: No   Sexual activity: Not Currently  Other Topics Concern   Not on file  Social History Narrative   Lives with Ginger Carne   G0   Married   Occupation: dispatcher   Edu: college   Activity: no regular exercise, does have treadmill and weight lifting bench   Diet: increasing water, fruits/vegetables daily   Social Determinants of Health   Financial Resource Strain: Not on file  Food Insecurity: Food Insecurity Present (01/16/2023)   Hunger Vital Sign    Worried About Running Out of Food in the Last Year: Never true    Ran Out of Food in the Last Year: Sometimes true  Transportation Needs: No Transportation Needs (01/16/2023)   PRAPARE - Administrator, Civil Service (Medical): No    Lack of Transportation (Non-Medical): No  Physical Activity: Not on file  Stress: Not on file  Social Connections: Not on file  Intimate Partner Violence: Not At Risk (01/16/2023)   Humiliation, Afraid, Rape, and Kick questionnaire    Fear of Current or Ex-Partner: No    Emotionally Abused: No    Physically Abused: No    Sexually Abused: No    Family History  Problem Relation Age of Onset   Glaucoma Mother    CAD Mother    Diabetes Mother    Stroke Mother    Cancer Father        unsure type   Hypertension Brother        and sister     Current Outpatient Medications:    acetaminophen (TYLENOL) 500 MG tablet, Take 1,000 mg by mouth  daily., Disp: , Rfl:    amLODipine (NORVASC) 5 MG tablet, Take 1 tablet (5 mg total) by mouth daily., Disp: 90 tablet, Rfl: 4   atorvastatin (LIPITOR) 20 MG tablet, Take 1 tablet (20 mg total) by mouth daily., Disp: 90 tablet, Rfl: 4   Cholecalciferol (VITAMIN D-3) 1000 UNITS CAPS, Take 1,000 Units by mouth daily., Disp: , Rfl:    Coenzyme Q10 (COQ-10) 100 MG CAPS, Take 100 mg by mouth daily., Disp: , Rfl:    cyanocobalamin 500 MCG tablet, Take 500 mcg by mouth daily. , Disp: , Rfl:    diclofenac Sodium (VOLTAREN) 1 % GEL, Apply 1 Application topically 3 (three) times daily., Disp: 100 g, Rfl: 1   famotidine (PEPCID) 20 MG tablet, Take 1 tablet (20 mg total) by mouth 2 (two) times daily., Disp: 180 tablet, Rfl: 4   gabapentin (NEURONTIN) 300 MG capsule, Take 1  capsule (300 mg total) by mouth at bedtime., Disp: 90 capsule, Rfl: 4   latanoprost (XALATAN) 0.005 % ophthalmic solution, , Disp: , Rfl: 4   methocarbamol (ROBAXIN) 500 MG tablet, TAKE 1 TABLET(500 MG) BY MOUTH TWICE DAILY AS NEEDED FOR MUSCLE SPASMS, Disp: 30 tablet, Rfl: 3   Omega-3 Fatty Acids (FISH OIL) 1000 MG CAPS, Take 1 capsule by mouth daily., Disp: , Rfl:    brimonidine (ALPHAGAN) 0.2 % ophthalmic solution, , Disp: , Rfl:    dorzolamide (TRUSOPT) 2 % ophthalmic solution, PLACE 1 DROP INTO BOTH EYES TWICE A DAY (Patient not taking: Reported on 01/16/2023), Disp: , Rfl: 5   Naltrexone-buPROPion HCl ER (CONTRAVE) 8-90 MG TB12, Start 1 tablet every morning for 7 days, then 1 tablet twice daily for 7 days, then 2 tablets every morning and one every evening (Patient not taking: Reported on 12/29/2022), Disp: 120 tablet, Rfl: 0   timolol (TIMOPTIC) 0.5 % ophthalmic solution, , Disp: , Rfl: 4  Physical exam:  Vitals:   02/02/23 1326  BP: 124/85  Pulse: 80  Resp: 18  Temp: (!) 97 F (36.1 C)  TempSrc: Tympanic  SpO2: 98%  Weight: 206 lb 4.8 oz (93.6 kg)  Height: 5\' 5"  (1.651 m)   Physical Exam Cardiovascular:     Rate and Rhythm:  Normal rate and regular rhythm.     Heart sounds: Normal heart sounds.  Pulmonary:     Effort: Pulmonary effort is normal.     Breath sounds: Normal breath sounds.  Skin:    General: Skin is warm and dry.  Neurological:     Mental Status: She is alert and oriented to person, place, and time.         Latest Ref Rng & Units 12/29/2022    1:11 PM  CMP  Total Protein 6.1 - 8.1 g/dL 7.4       Latest Ref Rng & Units 12/22/2022    7:52 AM  CBC  WBC 4.0 - 10.5 K/uL 5.4   Hemoglobin 12.0 - 15.0 g/dL 44.0   Hematocrit 10.2 - 46.0 % 39.6   Platelets 150.0 - 400.0 K/uL 227.0      Assessment and plan- Patient is a 67 y.o. female referred for MGUS  Discussed the results of blood work with the patient which shows IgG kappa MGUS of 0.9 g.  Serum free light chain ratio is mildly abnormal at 2.13.  She falls under the category of low intermediate risk MGUS and does not require a bone marrow biopsy at this time.  Discussed differences between MGUS and overt multiple myeloma.  She does not have any evidence of anemia or hypercalcemia.  She does not require a bone survey at this time.  If there is progressive worsening of her renal function she will need nephrology referral and consideration for renal biopsy.  It would be unlikely for a small 0.9 IgG kappa MGUS to typically cause multiple myeloma.  CBC CMP myeloma panel and serum free light chains in 6 months in 1 year and I will see her back in 1 year Visit Diagnosis 1. MGUS (monoclonal gammopathy of unknown significance)      Dr. Owens Shark, MD, MPH [facility] at [facility] [phone] 02/02/2023 1:48 PM

## 2023-03-13 ENCOUNTER — Other Ambulatory Visit: Payer: Self-pay | Admitting: Family Medicine

## 2023-03-13 DIAGNOSIS — M62838 Other muscle spasm: Secondary | ICD-10-CM

## 2023-03-16 NOTE — Telephone Encounter (Signed)
LAST APPOINTMENT DATE: 12/29/22   NEXT APPOINTMENT DATE: Visit date not found    LAST REFILL: 11/04/22  QTY: #30 3 rf

## 2023-06-26 ENCOUNTER — Ambulatory Visit: Payer: Self-pay | Admitting: Family Medicine

## 2023-06-26 NOTE — Telephone Encounter (Signed)
Copied from CRM [id number]. Topic: Clinical - Red Word Triage >> Jun 26, 2023  8:49 AM Irine Seal wrote: [facility] that prompted transfer to Nurse Triage: Pain level 7 left shoulder blade pain tingling and radiating down into hand,  symptoms presented a week ago,  Chief Complaint: shoulder pain Symptoms: right shoulder pain 7/10 that radiates down arm into finger tips, tingling sensation only with movement Frequency: x 1 week Pertinent Negatives: Patient denies weakness, numbness Disposition: [] ED /[] Urgent Care (no appt availability in office) / [x] Appointment(In office/virtual)/ []  [place] [facility]/ [] Home Care/ [] Refused Recommended Disposition /[] [place] [facility]/ []  Follow-up with PCP Additional Notes: used alieve PM: has temporary relief then pain comes right back.  Pt stated she was lifting a heavy item and not using proper body mechanics afterwards pain and tingling started.  Reason for Disposition  [1] MODERATE pain (e.g., interferes with normal activities) AND [2] present > 3 days  Answer Assessment - Initial Assessment Questions 1. ONSET: "When did the pain start?"     X1 week ago 2. LOCATION: "Where is the pain located?"    Right shoulder blade pain 7/10 radiates to fingertips. With use of hand comes tingling 3. PAIN: "How bad is the pain?" (Scale 1-10; or mild, moderate, severe)   - MILD (1-3): doesn't interfere with normal activities   - MODERATE (4-7): interferes with normal activities (e.g., work or school) or awakens from sleep   - SEVERE (8-10): excruciating pain, unable to do any normal activities, unable to move arm at all due to pain     7/10 4. WORK OR EXERCISE: "Has there been any recent work or exercise that involved this part of the body?"     N/a 5. CAUSE: "What do you think is causing the shoulder pain?"     Lifted something heavy and maybe no proper technique  6. OTHER SYMPTOMS: "Do you have any other symptoms?" (e.g., neck pain, swelling, rash, fever,  numbness, weakness)     no 7. PREGNANCY: "Is there any chance you are pregnant?" "When was your last menstrual period?"     N/a  Protocols used: Shoulder Pain-A-AH

## 2023-06-26 NOTE — Telephone Encounter (Signed)
I will see her next week

## 2023-06-28 NOTE — Progress Notes (Unsigned)
   Martha Dalgleish T. Corrie Brannen, MD, CAQ Sports Medicine [facility] at [facility] [address] [place], [address]  Phone: [phone]  FAX: [phone]  Martha Wade - 68 y.o. female  MRN [id number]  Date of Birth: June 15, 1955  Date: 06/29/2023  PCP: Eustaquio Boyden, MD  Referral: Eustaquio Boyden, MD  No chief complaint on file.  Subjective:   Martha Wade is a 68 y.o. very pleasant female patient with There is no height or weight on file to calculate BMI. who presents with the following:  Patient presents with some ongoing right-sided shoulder pain.  It looks as if she is predominantly having some left-sided shoulder blade pain and some radicular pains in the left shoulder.    Review of Systems is noted in the HPI, as appropriate  Objective:   LMP 11/20/2015   GEN: No acute distress; alert,appropriate. PULM: Breathing comfortably in no respiratory distress PSYCH: Normally interactive.   Laboratory and Imaging Data:  Assessment and Plan:   ***

## 2023-06-29 ENCOUNTER — Ambulatory Visit
Admission: RE | Admit: 2023-06-29 | Discharge: 2023-06-29 | Disposition: A | Discharge status: Home / Self Care | Payer: 59 | Source: Ambulatory Visit | Attending: Family Medicine | Admitting: Family Medicine

## 2023-06-29 ENCOUNTER — Ambulatory Visit: Payer: Self-pay | Admitting: Family Medicine

## 2023-06-29 ENCOUNTER — Encounter: Payer: Self-pay | Admitting: Family Medicine

## 2023-06-29 VITALS — BP 130/86 | HR 104 | Temp 98.1°F | Ht 65.0 in | Wt 212.0 lb

## 2023-06-29 DIAGNOSIS — M25512 Pain in left shoulder: Secondary | ICD-10-CM | POA: Diagnosis not present

## 2023-06-29 DIAGNOSIS — Z981 Arthrodesis status: Secondary | ICD-10-CM

## 2023-06-29 DIAGNOSIS — M5412 Radiculopathy, cervical region: Secondary | ICD-10-CM

## 2023-06-29 MED ORDER — PREDNISONE 20 MG PO TABS: ORAL_TABLET | ORAL | 0 refills | Status: DC

## 2023-06-29 NOTE — Patient Instructions (Signed)
Try to increase the gabapentin to twice a day.

## 2023-07-14 ENCOUNTER — Encounter: Payer: Self-pay | Admitting: Family Medicine

## 2023-07-14 ENCOUNTER — Ambulatory Visit (INDEPENDENT_AMBULATORY_CARE_PROVIDER_SITE_OTHER)
Admission: RE | Admit: 2023-07-14 | Discharge: 2023-07-14 | Disposition: A | Discharge status: Home / Self Care | Source: Ambulatory Visit | Attending: Family Medicine | Admitting: Family Medicine

## 2023-07-14 ENCOUNTER — Ambulatory Visit: Payer: 59 | Admitting: Family Medicine

## 2023-07-14 VITALS — BP 132/70 | HR 79 | Temp 98.3°F | Ht 65.0 in | Wt 210.4 lb

## 2023-07-14 DIAGNOSIS — M5412 Radiculopathy, cervical region: Secondary | ICD-10-CM | POA: Diagnosis not present

## 2023-07-14 DIAGNOSIS — R202 Paresthesia of skin: Secondary | ICD-10-CM | POA: Insufficient documentation

## 2023-07-14 DIAGNOSIS — D472 Monoclonal gammopathy: Secondary | ICD-10-CM

## 2023-07-14 DIAGNOSIS — M549 Dorsalgia, unspecified: Secondary | ICD-10-CM

## 2023-07-14 NOTE — Progress Notes (Unsigned)
 Ph: [phone] Fax: [phone]   Patient ID: Martha Wade, female    DOB: [birth year], 68 y.o.   MRN: [id number]  This visit was conducted in person.  BP 132/70   Pulse 79   Temp 98.3 F (36.8 C) (Oral)   Ht 5\' 5"  (1.651 m)   Wt 210 lb 6 oz (95.4 kg)   LMP 11/20/2015   SpO2 97%   BMI 35.01 kg/m    CC: leg and knee heaviness, L hand tingling Subjective:   HPI: Martha Wade is a 68 y.o. female presenting on 07/14/2023 for Medical Management of Chronic Issues (C/o heaviness in B legs/knees radiating from back pain. Also, c/o tingling in B hands- worse in L. Told she may have pinched nerve. )   Saw Dr Patsy Lager 2 wks ago weeks of worsening L hand tingling thought to be related to cervical radiculopathy. Treated with prednisone taper (limited benefit) as well as increased gabapentin to 300mg  BID PRN.   Also endorses months of mid to low back pressure sensation "heavy feeling like I'm carrying cement block to mid spine" associated with radiation of heaviness to bilateral legs and knees.   Denies inciting trauma/injury, although she notes she did fall last year and landed on her back.  Ongoing L hand tingling to entire hand worse with prolonged holding of phone. Previous L arm dull pain has improved after prednisone taper.  She notes that when she bends her neck down to the right she feels tingling to L thumb.   S/p ACDF 2015 by Dr Yevette Edwards.  Wants to avoid surgery.  She is interested in weight loss - Contrave was unaffordable.   01/2021 MRI CERVICAL SPINE IMPRESSION:  ACDF C4 through C7 without complication or stenosis   Disc degeneration and spurring at C7-T1. There is right C8 nerve root impingement due to spurring. There is also left foraminal encroachment at C7 T1-2 lesser extent.  Electronically Signed    By: [name] M.D.    On: 02/05/2021 09:15      Relevant past medical, surgical, family and social history reviewed and updated as indicated. Interim medical  history since our last visit reviewed. Allergies and medications reviewed and updated. Outpatient Medications Prior to Visit  Medication Sig Dispense Refill  . acetaminophen (TYLENOL) 500 MG tablet Take 1,000 mg by mouth daily.    Marland Kitchen amLODipine (NORVASC) 5 MG tablet Take 1 tablet (5 mg total) by mouth daily. 90 tablet 4  . atorvastatin (LIPITOR) 20 MG tablet Take 1 tablet (20 mg total) by mouth daily. 90 tablet 4  . brimonidine (ALPHAGAN) 0.2 % ophthalmic solution     . Cholecalciferol (VITAMIN D-3) 1000 UNITS CAPS Take 1,000 Units by mouth daily.    . Coenzyme Q10 (COQ-10) 100 MG CAPS Take 100 mg by mouth daily.    . cyanocobalamin 500 MCG tablet Take 500 mcg by mouth daily.     . diclofenac Sodium (VOLTAREN) 1 % GEL Apply 1 Application topically 3 (three) times daily. 100 g 1  . famotidine (PEPCID) 20 MG tablet Take 1 tablet (20 mg total) by mouth 2 (two) times daily. 180 tablet 4  . gabapentin (NEURONTIN) 300 MG capsule Take 1 capsule (300 mg total) by mouth at bedtime. 90 capsule 4  . latanoprost (XALATAN) 0.005 % ophthalmic solution   4  . methocarbamol (ROBAXIN) 500 MG tablet TAKE 1 TABLET(500 MG) BY MOUTH TWICE DAILY AS NEEDED FOR MUSCLE SPASMS 30 tablet 3  . Omega-3 Fatty Acids (  FISH OIL) 1000 MG CAPS Take 1 capsule by mouth daily.    . timolol (TIMOPTIC) 0.5 % ophthalmic solution   4  . dorzolamide (TRUSOPT) 2 % ophthalmic solution   5  . predniSONE (DELTASONE) 20 MG tablet 2 tabs po for 7 days, then 1 tab po for 7 days 21 tablet 0   No facility-administered medications prior to visit.     Per HPI unless specifically indicated in ROS section below Review of Systems  Objective:  BP 132/70   Pulse 79   Temp 98.3 F (36.8 C) (Oral)   Ht 5\' 5"  (1.651 m)   Wt 210 lb 6 oz (95.4 kg)   LMP 11/20/2015   SpO2 97%   BMI 35.01 kg/m   Wt Readings from Last 3 Encounters:  07/14/23 210 lb 6 oz (95.4 kg)  06/29/23 212 lb (96.2 kg)  02/02/23 206 lb 4.8 oz (93.6 kg)      Physical  Exam Vitals and nursing note reviewed.  Constitutional:      Appearance: Normal appearance. She is not ill-appearing.  Musculoskeletal:        General: No tenderness.     Right lower leg: No edema.     Left lower leg: No edema.     Comments:  No pain midline spine No paraspinous mm tenderness Neg SLR bilaterally. No pain with int/ext rotation at hip. Neg FABER. No pain at SIJ, GTB or sciatic notch bilaterally.  Skin:    General: Skin is warm and dry.     Findings: No rash.  Neurological:     General: No focal deficit present.     Mental Status: She is alert.     Sensory: Sensation is intact.     Motor: Motor function is intact.     Coordination: Coordination is intact.     Gait: Gait is intact.     Deep Tendon Reflexes:     Reflex Scores:      Patellar reflexes are 2+ on the right side and 2+ on the left side.      Achilles reflexes are 2+ on the right side and 2+ on the left side.    Comments:  Grip strength intact Neg tinel and phalen bilateral hands  5/5 strength BLE  Psychiatric:        Mood and Affect: Mood normal.        Behavior: Behavior normal.       Results for orders placed or performed in visit on 01/20/23  Protein Electro, 24-Hour Urine   Collection Time: 01/20/23  9:05 AM  Result Value Ref Range   Total Protein, Urine 5.0 Not Estab. mg/dL   Total Protein, Urine-Ur/day 105 30 - 150 mg/24 hr   Albumin, U 100.0 %   Alpha 1, Urine 0.0 %   Alpha 2, Urine 0.0 %   Beta, Urine 0.0 %   Gamma Globulin, Urine 0.0 %   M-spike, % Not Observed Not Observed %   Please Note: Comment    Total Volume 2,100    No results found for: "VITAMINB12"   Assessment & Plan:   Problem List Items Addressed This Visit     Radiculitis of left cervical region   IgG monoclonal gammopathy   Mid back pain   Describes lower thoracic, upper lumbar spine pressure fullness sensation present for last few months, with radiation down both legs.  She desires to avoid surgery.  H/o  MGUS. Will update lumbar and thoracic spine films -

## 2023-07-14 NOTE — Assessment & Plan Note (Addendum)
 Recent prednisone course has improved L arm pain but L hand numbness persists, reproduced with rightward neck forward flexion. Exam today not consistent with CTS.  Cervical films showing degenerative changes, official report still pending.  Continue gabapentin 300mg  up to TID PRN.  In h/o ACDF 2015, consider updated advanced imaging (cervical MRI) vs return to orthopedic surgeon.

## 2023-07-14 NOTE — Progress Notes (Incomplete)
 Ph: [phone] Fax: [phone]   Patient ID: Martha Wade, female    DOB: [birth year], 68 y.o.   MRN: [id number]  This visit was conducted in person.  BP 132/70   Pulse 79   Temp 98.3 F (36.8 C) (Oral)   Ht 5\' 5"  (1.651 m)   Wt 210 lb 6 oz (95.4 kg)   LMP 11/20/2015   SpO2 97%   BMI 35.01 kg/m    CC: leg and knee heaviness, L hand tingling Subjective:   HPI: Martha Wade is a 68 y.o. female presenting on 07/14/2023 for Medical Management of Chronic Issues (C/o heaviness in B legs/knees radiating from back pain. Also, c/o tingling in B hands- worse in L. Told she may have pinched nerve. )   Saw Dr Patsy Lager 2 wks ago weeks of worsening L hand tingling thought to be related to cervical radiculopathy. Treated with prednisone taper (limited benefit) as well as increased gabapentin to 300mg  BID PRN.   Also endorses months of mid to low back pressure sensation "heavy feeling like I'm carrying cement block to mid spine" associated with radiation of heaviness to bilateral legs and knees.   Denies inciting trauma/injury, although she notes she did fall last year and landed on her back.  Ongoing L hand tingling to entire hand worse with prolonged holding of phone. Previous L arm dull pain has improved after prednisone taper.  She notes that when she bends her neck down to the right she feels tingling to L thumb.   S/p ACDF 2015 by Dr Yevette Edwards.  Wants to avoid surgery.  She is interested in weight loss - Contrave was unaffordable.   01/2021 MRI CERVICAL SPINE IMPRESSION:  ACDF C4 through C7 without complication or stenosis   Disc degeneration and spurring at C7-T1. There is right C8 nerve root impingement due to spurring. There is also left foraminal encroachment at C7 T1-2 lesser extent.  Electronically Signed    By: [name] M.D.    On: 02/05/2021 09:15      Relevant past medical, surgical, family and social history reviewed and updated as indicated. Interim medical  history since our last visit reviewed. Allergies and medications reviewed and updated. Outpatient Medications Prior to Visit  Medication Sig Dispense Refill  . acetaminophen (TYLENOL) 500 MG tablet Take 1,000 mg by mouth daily.    Marland Kitchen amLODipine (NORVASC) 5 MG tablet Take 1 tablet (5 mg total) by mouth daily. 90 tablet 4  . atorvastatin (LIPITOR) 20 MG tablet Take 1 tablet (20 mg total) by mouth daily. 90 tablet 4  . brimonidine (ALPHAGAN) 0.2 % ophthalmic solution     . Cholecalciferol (VITAMIN D-3) 1000 UNITS CAPS Take 1,000 Units by mouth daily.    . Coenzyme Q10 (COQ-10) 100 MG CAPS Take 100 mg by mouth daily.    . cyanocobalamin 500 MCG tablet Take 500 mcg by mouth daily.     . diclofenac Sodium (VOLTAREN) 1 % GEL Apply 1 Application topically 3 (three) times daily. 100 g 1  . famotidine (PEPCID) 20 MG tablet Take 1 tablet (20 mg total) by mouth 2 (two) times daily. 180 tablet 4  . gabapentin (NEURONTIN) 300 MG capsule Take 1 capsule (300 mg total) by mouth at bedtime. 90 capsule 4  . latanoprost (XALATAN) 0.005 % ophthalmic solution   4  . methocarbamol (ROBAXIN) 500 MG tablet TAKE 1 TABLET(500 MG) BY MOUTH TWICE DAILY AS NEEDED FOR MUSCLE SPASMS 30 tablet 3  . Omega-3 Fatty Acids (  FISH OIL) 1000 MG CAPS Take 1 capsule by mouth daily.    . timolol (TIMOPTIC) 0.5 % ophthalmic solution   4  . dorzolamide (TRUSOPT) 2 % ophthalmic solution   5  . predniSONE (DELTASONE) 20 MG tablet 2 tabs po for 7 days, then 1 tab po for 7 days 21 tablet 0   No facility-administered medications prior to visit.     Per HPI unless specifically indicated in ROS section below Review of Systems  Objective:  BP 132/70   Pulse 79   Temp 98.3 F (36.8 C) (Oral)   Ht 5\' 5"  (1.651 m)   Wt 210 lb 6 oz (95.4 kg)   LMP 11/20/2015   SpO2 97%   BMI 35.01 kg/m   Wt Readings from Last 3 Encounters:  07/14/23 210 lb 6 oz (95.4 kg)  06/29/23 212 lb (96.2 kg)  02/02/23 206 lb 4.8 oz (93.6 kg)      Physical  Exam Vitals and nursing note reviewed.  Constitutional:      Appearance: Normal appearance. She is not ill-appearing.  Musculoskeletal:        General: No tenderness.     Right lower leg: No edema.     Left lower leg: No edema.     Comments:  No pain midline spine No paraspinous mm tenderness Neg SLR bilaterally. No pain with int/ext rotation at hip. Neg FABER. No pain at SIJ, GTB or sciatic notch bilaterally.  Skin:    General: Skin is warm and dry.     Findings: No rash.  Neurological:     General: No focal deficit present.     Mental Status: She is alert.     Sensory: Sensation is intact.     Motor: Motor function is intact.     Coordination: Coordination is intact.     Gait: Gait is intact.     Deep Tendon Reflexes:     Reflex Scores:      Patellar reflexes are 2+ on the right side and 2+ on the left side.      Achilles reflexes are 2+ on the right side and 2+ on the left side.    Comments:  Grip strength intact Neg tinel and phalen bilateral hands  5/5 strength BLE  Psychiatric:        Mood and Affect: Mood normal.        Behavior: Behavior normal.       Results for orders placed or performed in visit on 01/20/23  Protein Electro, 24-Hour Urine   Collection Time: 01/20/23  9:05 AM  Result Value Ref Range   Total Protein, Urine 5.0 Not Estab. mg/dL   Total Protein, Urine-Ur/day 105 30 - 150 mg/24 hr   Albumin, U 100.0 %   Alpha 1, Urine 0.0 %   Alpha 2, Urine 0.0 %   Beta, Urine 0.0 %   Gamma Globulin, Urine 0.0 %   M-spike, % Not Observed Not Observed %   Please Note: Comment    Total Volume 2,100    No results found for: "VITAMINB12"   Assessment & Plan:   Problem List Items Addressed This Visit     Radiculitis of left cervical region   IgG monoclonal gammopathy   Mid back pain   Describes lower thoracic, upper lumbar spine pressure fullness sensation present for last few months, with radiation down both legs.  She desires to avoid surgery.  H/o  MGUS. Will update lumbar and thoracic spine films -  scoliosis and DDD noted, await formal radiology report.  May trial increasing gabapentin to 300mg  TID PRN Consider advanced imaging.       Relevant Orders   DG Thoracic Spine W/Swimmers   DG Lumbar Spine Complete   Left hand paresthesia - Primary   Recent prednisone course has improved L arm pain but L hand numbness persists, reproduced with rightward neck forward flexion. Exam today not consistent with CTS.  Cervical films showing degenerative changes, official report still pending.  Continue gabapentin 300mg  up to TID PRN.  In h/o ACDF 2015, consider updated advanced imaging (cervical MRI) vs return to orthopedic surgeon.         No orders of the defined types were placed in this encounter.   Orders Placed This Encounter  Procedures  . DG Thoracic Spine W/Swimmers    Standing Status:   Future    Number of Occurrences:   1    Expiration Date:   07/13/2024    Reason for Exam (SYMPTOM  OR DIAGNOSIS REQUIRED):   mid back pressure for months    Preferred imaging location?:   [facility]  . DG Lumbar Spine Complete    Standing Status:   Future    Number of Occurrences:   1    Expiration Date:   07/13/2024    Reason for Exam (SYMPTOM  OR DIAGNOSIS REQUIRED):   upper lumbar back pressure feeling for months    Preferred imaging location?:   [facility]    Patient Instructions  Back xrays today  Continue gabapentin 300mg  up to 3 times a day as needed (with sedation precautions). Call insurance to ask about Zepbound or Wegovy or Saxenda (daily) injections for weight loss   Follow up plan: No follow-ups on file.  Eustaquio Boyden, MD

## 2023-07-14 NOTE — Assessment & Plan Note (Signed)
 Describes lower thoracic, upper lumbar spine pressure fullness sensation present for last few months, with radiation down both legs.  She desires to avoid surgery.  H/o MGUS. Will update lumbar and thoracic spine films - scoliosis and DDD noted, await formal radiology report.  May trial increasing gabapentin to 300mg  TID PRN Consider advanced imaging.

## 2023-07-14 NOTE — Patient Instructions (Addendum)
 Back xrays today  Continue gabapentin 300mg  up to 3 times a day as needed (with sedation precautions). Call insurance to ask about Zepbound or Wegovy or Saxenda (daily) injections for weight loss

## 2023-07-23 ENCOUNTER — Other Ambulatory Visit: Payer: Self-pay | Admitting: Family Medicine

## 2023-07-23 DIAGNOSIS — Z1231 Encounter for screening mammogram for malignant neoplasm of breast: Secondary | ICD-10-CM

## 2023-08-03 ENCOUNTER — Inpatient Hospital Stay: Payer: BC Managed Care – PPO | Attending: Oncology

## 2023-08-03 DIAGNOSIS — D472 Monoclonal gammopathy: Secondary | ICD-10-CM | POA: Insufficient documentation

## 2023-08-03 LAB — COMPREHENSIVE METABOLIC PANEL
ALT: 18 U/L (ref 0–44)
AST: 19 U/L (ref 15–41)
Albumin: 4 g/dL (ref 3.5–5.0)
Alkaline Phosphatase: 65 U/L (ref 38–126)
Anion gap: 10 (ref 5–15)
BUN: 10 mg/dL (ref 8–23)
CO2: 26 mmol/L (ref 22–32)
Calcium: 9.1 mg/dL (ref 8.9–10.3)
Chloride: 99 mmol/L (ref 98–111)
Creatinine, Ser: 1.04 mg/dL — ABNORMAL HIGH (ref 0.44–1.00)
GFR, Estimated: 59 mL/min — ABNORMAL LOW (ref >60)
Glucose, Bld: 129 mg/dL — ABNORMAL HIGH (ref 70–99)
Potassium: 3.5 mmol/L (ref 3.5–5.1)
Sodium: 135 mmol/L (ref 135–145)
Total Bilirubin: 0.7 mg/dL (ref 0.0–1.2)
Total Protein: 8.1 g/dL (ref 6.5–8.1)

## 2023-08-03 LAB — CBC WITH DIFFERENTIAL/PLATELET
Abs Immature Granulocytes: 0.02 10*3/uL (ref 0.00–0.07)
Basophils Absolute: 0 10*3/uL (ref 0.0–0.1)
Basophils Relative: 1 %
Eosinophils Absolute: 0.1 10*3/uL (ref 0.0–0.5)
Eosinophils Relative: 2 %
HCT: 40.2 % (ref 36.0–46.0)
Hemoglobin: 12.9 g/dL (ref 12.0–15.0)
Immature Granulocytes: 0 %
Lymphocytes Relative: 34 %
Lymphs Abs: 1.8 10*3/uL (ref 0.7–4.0)
MCH: 30.6 pg (ref 26.0–34.0)
MCHC: 32.1 g/dL (ref 30.0–36.0)
MCV: 95.3 fL (ref 80.0–100.0)
Monocytes Absolute: 0.5 10*3/uL (ref 0.1–1.0)
Monocytes Relative: 9 %
Neutro Abs: 2.9 10*3/uL (ref 1.7–7.7)
Neutrophils Relative %: 54 %
Platelets: 250 10*3/uL (ref 150–400)
RBC: 4.22 MIL/uL (ref 3.87–5.11)
RDW: 12.5 % (ref 11.5–15.5)
WBC: 5.3 10*3/uL (ref 4.0–10.5)
nRBC: 0 % (ref 0.0–0.2)

## 2023-08-04 LAB — KAPPA/LAMBDA LIGHT CHAINS
Kappa free light chain: 29.1 mg/L — ABNORMAL HIGH (ref 3.3–19.4)
Kappa, lambda light chain ratio: 1.89 — ABNORMAL HIGH (ref 0.26–1.65)
Lambda free light chains: 15.4 mg/L (ref 5.7–26.3)

## 2023-08-05 LAB — MULTIPLE MYELOMA PANEL, SERUM
Albumin SerPl Elph-Mcnc: 3.8 g/dL (ref 2.9–4.4)
Albumin/Glob SerPl: 1.1 (ref 0.7–1.7)
Alpha 1: 0.2 g/dL (ref 0.0–0.4)
Alpha2 Glob SerPl Elph-Mcnc: 0.7 g/dL (ref 0.4–1.0)
B-Globulin SerPl Elph-Mcnc: 1.3 g/dL (ref 0.7–1.3)
Gamma Glob SerPl Elph-Mcnc: 1.5 g/dL (ref 0.4–1.8)
Globulin, Total: 3.8 g/dL (ref 2.2–3.9)
IgA: 498 mg/dL — ABNORMAL HIGH (ref 87–352)
IgG (Immunoglobin G), Serum: 1587 mg/dL (ref 586–1602)
IgM (Immunoglobulin M), Srm: 72 mg/dL (ref 26–217)
M Protein SerPl Elph-Mcnc: 0.7 g/dL — ABNORMAL HIGH
Total Protein ELP: 7.6 g/dL (ref 6.0–8.5)

## 2023-08-10 ENCOUNTER — Other Ambulatory Visit: Payer: Self-pay | Admitting: Family Medicine

## 2023-08-10 ENCOUNTER — Encounter

## 2023-08-10 DIAGNOSIS — M62838 Other muscle spasm: Secondary | ICD-10-CM

## 2023-08-11 NOTE — Telephone Encounter (Signed)
 LAST APPOINTMENT DATE: 07/14/23     NEXT APPOINTMENT DATE: Visit date not found       LAST REFILL: 03/17/23   QTY: #30 3 rf

## 2023-08-12 ENCOUNTER — Encounter: Payer: Self-pay | Admitting: Oncology

## 2023-08-24 ENCOUNTER — Ambulatory Visit
Admission: RE | Admit: 2023-08-24 | Discharge: 2023-08-24 | Disposition: A | Discharge status: Home / Self Care | Source: Ambulatory Visit | Attending: Family Medicine | Admitting: Family Medicine

## 2023-08-24 DIAGNOSIS — Z1231 Encounter for screening mammogram for malignant neoplasm of breast: Secondary | ICD-10-CM | POA: Insufficient documentation

## 2023-08-26 ENCOUNTER — Encounter: Payer: Self-pay | Admitting: Family Medicine

## 2023-08-26 NOTE — Progress Notes (Unsigned)
 Martha Rom T. Merril Nagy, MD, CAQ Sports Medicine [facility] at [facility] [address] [place], [address]  Phone: [phone]  FAX: [phone]  Martha Wade - 68 y.o. female  MRN [id number]  Date of Birth: [birth year]  Date: 08/27/2023  PCP: Claire Crick, MD  Referral: Claire Crick, MD  Chief Complaint  Patient presents with   Low Pain Pain    About 1 week ago had really bad pain. States she has had some discomfort but not pain. She is taking gabapentin  3 times daily.    Subjective:   Martha Wade is a 68 y.o. very pleasant female patient with Body mass index is 35.61 kg/m. who presents with the following:  Is a pleasant patient seen a number of times over the years.  She presents today with some ongoing low back pain.  I saw her roughly 2 months ago with some cervical radiculopathy.  History is significant for a multilevel cervical decompression and fusion done by Dr. Jackee Marus in 2015 in the cervical spine.  Right now for a little bit more than a week she is having some relatively severe back pain in the lowest part of her back.  She rates this at a 7 out of 10 on pain scale. She is having a lot of muscular spasm as well as pain.  It is feeling a little bit better today than it was over the last few days.  Pain was so severe that she has had to take some Vicodin, which she also felt impacted her clarity.   She does not have any numbness, tingling, radicular pain or focal weakness.  No bowel or bladder incontinence.  Review of Systems is noted in the HPI, as appropriate  Objective:   BP 124/76 (BP Location: Left Arm, Patient Position: Sitting, Cuff Size: Large)   Pulse 83   Temp 98.7 F (37.1 C) (Oral)   Ht 5\' 5"  (1.651 m)   Wt 214 lb (97.1 kg)   LMP 11/20/2015   SpO2 98%   BMI 35.61 kg/m   GEN: No acute distress; alert,appropriate. PULM: Breathing comfortably in no respiratory distress PSYCH: Normally interactive.     Range of motion at  the waist: Flexion, extension, lateral bending and rotation: Mild limitation in forward flexion.  Pain with extension.  Lateral bending and rotational maneuvers are more preserved.  No echymosis or edema Rises to examination table with mild difficulty Gait: minimally antalgic  Inspection/Deformity: N Paraspinus Tenderness: L3-S1 bilaterally  B Ankle Dorsiflexion (L5,4): 5/5 B Great Toe Dorsiflexion (L5,4): 5/5 Heel Walk (L5): WNL Toe Walk (S1): WNL Rise/Squat (L4): WNL, mild pain  SENSORY B Medial Foot (L4): WNL B Dorsum (L5): WNL B Lateral (S1): WNL Light Touch: WNL Pinprick: WNL  B SLR, seated: neg B SLR, supine: neg B FABER: neg B Reverse FABER: neg B Greater Troch: NT B Log Roll: neg B Sciatic Notch: NT   Laboratory and Imaging Data:  Assessment and Plan:     ICD-10-CM   1. Acute bilateral low back pain without sciatica  M54.50      Focal low back pain without neurological compromise.  No red flags.  I am going to give her pulse of some steroids and some tramadol  to use as needed for pain.  She can progress and do some basic stretching and home rehab.  Medication Management during today's office visit: Meds ordered this encounter  Medications   traMADol  (ULTRAM ) 50 MG tablet    Sig:  Take 1 tablet (50 mg total) by mouth every 8 (eight) hours as needed for moderate pain (pain score 4-6).    Dispense:  20 tablet    Refill:  0   predniSONE  (DELTASONE ) 20 MG tablet    Sig: 2 tabs po daily for 5 days, then 1 tab po daily for 5 days    Dispense:  15 tablet    Refill:  0    Disposition: As needed  [employer] One speech-to-text software was used for transcription in this dictation.  Possible transcriptional errors can occur using Animal nutritionist.   Signed,  Ranny Bye. Javonda Suh, MD   Outpatient Encounter Medications as of 08/27/2023  Medication Sig   acetaminophen  (TYLENOL ) 500 MG tablet Take 1,000 mg by mouth daily.    amLODipine  (NORVASC ) 5 MG tablet Take 1 tablet (5 mg total) by mouth daily.   atorvastatin  (LIPITOR) 20 MG tablet Take 1 tablet (20 mg total) by mouth daily.   brimonidine (ALPHAGAN) 0.2 % ophthalmic solution    Cholecalciferol  (VITAMIN D -3) 1000 UNITS CAPS Take 1,000 Units by mouth daily.   Coenzyme Q10 (COQ-10) 100 MG CAPS Take 100 mg by mouth daily.   cyanocobalamin  500 MCG tablet Take 500 mcg by mouth daily.    diclofenac  Sodium (VOLTAREN ) 1 % GEL Apply 1 Application topically 3 (three) times daily.   famotidine  (PEPCID ) 20 MG tablet Take 1 tablet (20 mg total) by mouth 2 (two) times daily.   gabapentin  (NEURONTIN ) 300 MG capsule Take 1 capsule (300 mg total) by mouth at bedtime.   latanoprost  (XALATAN ) 0.005 % ophthalmic solution    methocarbamol  (ROBAXIN ) 500 MG tablet TAKE 1 TABLET(500 MG) BY MOUTH TWICE DAILY AS NEEDED FOR MUSCLE SPASMS   Omega-3 Fatty Acids (FISH OIL) 1000 MG CAPS Take 1 capsule by mouth daily.   predniSONE  (DELTASONE ) 20 MG tablet 2 tabs po daily for 5 days, then 1 tab po daily for 5 days   timolol (TIMOPTIC) 0.5 % ophthalmic solution    traMADol  (ULTRAM ) 50 MG tablet Take 1 tablet (50 mg total) by mouth every 8 (eight) hours as needed for moderate pain (pain score 4-6).   No facility-administered encounter medications on file as of 08/27/2023.

## 2023-08-26 NOTE — Telephone Encounter (Signed)
 Mychart message sent asking pt to schedule an appointment in the office.

## 2023-08-27 ENCOUNTER — Ambulatory Visit: Admitting: Family Medicine

## 2023-08-27 VITALS — BP 124/76 | HR 83 | Temp 98.7°F | Ht 65.0 in | Wt 214.0 lb

## 2023-08-27 DIAGNOSIS — M545 Low back pain, unspecified: Secondary | ICD-10-CM

## 2023-08-27 MED ORDER — PREDNISONE 20 MG PO TABS: ORAL_TABLET | ORAL | 0 refills | Status: DC

## 2023-08-27 MED ORDER — TRAMADOL HCL 50 MG PO TABS: 50.0000 mg | ORAL_TABLET | Freq: Three times a day (TID) | ORAL | 0 refills | Status: AC | PRN

## 2023-08-30 ENCOUNTER — Encounter: Payer: Self-pay | Admitting: Family Medicine

## 2023-09-16 ENCOUNTER — Telehealth: Payer: Self-pay | Admitting: Family Medicine

## 2023-09-16 NOTE — Telephone Encounter (Signed)
 Spoke with patient Saw Dr Geralyn Knee a few weeks ago - for acute on chronic lower back pain.  Rx prednisone , tramadol  however these were not well tolerated - caused sweating and a crawling sensation to back of neck. She stopped both these meds.  She has restarted methocarbamol  and increased gabapentin  with some benefit.  Back is slowly feeling better.

## 2023-12-21 ENCOUNTER — Other Ambulatory Visit: Payer: Self-pay | Admitting: Family Medicine

## 2023-12-21 DIAGNOSIS — M62838 Other muscle spasm: Secondary | ICD-10-CM

## 2023-12-21 NOTE — Telephone Encounter (Signed)
 Name of Medication: methocarbamol   Name of Pharmacy: [facility]  Last Fill or Written Date and Quantity:  08/12/23 #30 3 rf  Last Office Visit and Type: Dr. Watt LBP 08/27/23 Dr. KANDICE lt hand 07/14/23 Next Office Visit and Type: n/a

## 2024-01-12 ENCOUNTER — Ambulatory Visit (INDEPENDENT_AMBULATORY_CARE_PROVIDER_SITE_OTHER): Admitting: Family Medicine

## 2024-01-12 ENCOUNTER — Ambulatory Visit: Payer: Self-pay

## 2024-01-12 ENCOUNTER — Encounter: Payer: Self-pay | Admitting: Family Medicine

## 2024-01-12 VITALS — BP 122/80 | HR 85 | Temp 98.8°F | Ht 65.0 in | Wt 208.2 lb

## 2024-01-12 DIAGNOSIS — G5603 Carpal tunnel syndrome, bilateral upper limbs: Secondary | ICD-10-CM

## 2024-01-12 DIAGNOSIS — R202 Paresthesia of skin: Secondary | ICD-10-CM

## 2024-01-12 DIAGNOSIS — M5412 Radiculopathy, cervical region: Secondary | ICD-10-CM | POA: Diagnosis not present

## 2024-01-12 MED ORDER — PREDNISONE 20 MG PO TABS: ORAL_TABLET | ORAL | 0 refills | Status: DC

## 2024-01-12 MED ORDER — GABAPENTIN 300 MG PO CAPS
300.0000 mg | ORAL_CAPSULE | Freq: Two times a day (BID) | ORAL | 1 refills | Status: AC
Start: 2024-01-12 — End: ?

## 2024-01-12 MED ORDER — B COMPLEX VITAMINS PO CAPS
1.0000 | ORAL_CAPSULE | Freq: Every day | ORAL | Status: DC
Start: 2024-01-12 — End: 2024-01-18

## 2024-01-12 NOTE — Assessment & Plan Note (Signed)
 Chronic for months.  Exam suspicious for component of carpal tunnel syndrome - discussed with patient Anticipate multifactorial predominantly Carpal tunnel, but also including cervical radiculopathy.  Check labs r/o vit b12 deficiency or thyroid  issues.  Discussed NCS as next step in evaluation to determine further management recommendations - will order NCS through [facility] neurology in [place].
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
## 2024-01-12 NOTE — Patient Instructions (Addendum)
 Ok to increase gabapentin  to twice daily - new dose sent to pharmacy.  Labs today  I will order nerve conduction study to be done in [facility] neurology).  Use wrist brace especially when sleeping at night  Pending results may refer you to physical therapy or hand surgeon.  Prednisone  taper sent to pharmacy.

## 2024-01-12 NOTE — Telephone Encounter (Signed)
 Will see today.

## 2024-01-12 NOTE — Telephone Encounter (Signed)
 FYI Only or Action Required?: FYI only for provider.  Patient was last seen in primary care on 08/27/2023 by Watt Mirza, MD.  Called Nurse Triage reporting Neck Pain.  Symptoms began ongoing - .  Interventions attempted: Other: medications.  Symptoms are: gradually improving.  Triage Disposition: See Physician Within 24 Hours  Patient/caregiver understands and will follow disposition?: Yes          Copied from CRM #[id number]. Topic: Clinical - Red Word Triage >> Jan 12, 2024 10:00 AM Jayma L wrote: [facility] that prompted transfer to Nurse Triage: neck pain getting worse , tingling in hands, in left hand mostly but some in right hand .SABRA Tingling when she touches objects Reason for Disposition  Numbness in an arm or hand (i.e., loss of sensation)  Answer Assessment - Initial Assessment Questions Pt has ongoing neck pain with tingling and numbness in hands. Pt saw sports medicine and was advised to increase gabapentin  dose. Pt is now out of this medication. Pt is interested in any other medications that may help such as prednisone .      1. ONSET: When did the pain begin?      Ongoing - has a pinched nerve 2. LOCATION: Where does it hurt?      Neck pain 3. PATTERN Does the pain come and go, or has it been constant since it started?      ongoing 4. SEVERITY: How bad is the pain?  (Scale 0-10; or none or slight stiffness, mild, moderate, severe)     na 5. RADIATION: Does the pain go anywhere else, shoot into your arms?     na 6. CORD SYMPTOMS: Any weakness or numbness of the arms or legs?     yes 7. CAUSE: What do you think is causing the neck pain?     Pinched nerve 8. NECK OVERUSE: Any recent activities that involved turning or twisting the neck?     no 9. OTHER SYMPTOMS: Do you have any other symptoms? (e.g., headache, fever, chest pain, difficulty breathing, neck swelling)     no  Protocols used: Neck Pain or Stiffness-A-AH

## 2024-01-13 DIAGNOSIS — G5603 Carpal tunnel syndrome, bilateral upper limbs: Secondary | ICD-10-CM | POA: Insufficient documentation

## 2024-01-13 LAB — TSH: TSH: 1.76 u[IU]/mL (ref 0.35–5.50)

## 2024-01-13 LAB — VITAMIN B12: Vitamin B-12: >1500 pg/mL — ABNORMAL HIGH (ref 211–911)

## 2024-01-13 NOTE — Assessment & Plan Note (Addendum)
 Denies significant neck pain at this time.  Prior imaging showing cervical spondylosis, latest cervical films 07/2023.  Continue gabapentin  300mg  BID. Updated Rx sent to pharmacy.  Will also prescribe 7d prednisone  taper

## 2024-01-13 NOTE — Assessment & Plan Note (Signed)
 L>R  Check NCS RUE Rec wrist brace at night time.

## 2024-01-18 ENCOUNTER — Ambulatory Visit: Payer: Self-pay | Admitting: Family Medicine

## 2024-01-25 ENCOUNTER — Encounter: Payer: Self-pay | Admitting: Neurology

## 2024-02-02 ENCOUNTER — Inpatient Hospital Stay: Payer: BC Managed Care – PPO | Attending: Oncology

## 2024-02-02 ENCOUNTER — Inpatient Hospital Stay: Payer: BC Managed Care – PPO | Admitting: Oncology

## 2024-02-02 ENCOUNTER — Encounter: Payer: Self-pay | Admitting: Oncology

## 2024-02-02 VITALS — BP 130/84 | HR 75 | Temp 97.5°F | Resp 18 | Ht 65.0 in | Wt 210.1 lb

## 2024-02-02 DIAGNOSIS — D472 Monoclonal gammopathy: Secondary | ICD-10-CM

## 2024-02-02 LAB — COMPREHENSIVE METABOLIC PANEL WITH GFR
ALT: 19 U/L (ref 0–44)
AST: 19 U/L (ref 15–41)
Albumin: 3.8 g/dL (ref 3.5–5.0)
Alkaline Phosphatase: 60 U/L (ref 38–126)
Anion gap: 7 (ref 5–15)
BUN: 15 mg/dL (ref 8–23)
CO2: 27 mmol/L (ref 22–32)
Calcium: 9.4 mg/dL (ref 8.9–10.3)
Chloride: 104 mmol/L (ref 98–111)
Creatinine, Ser: 1.02 mg/dL — ABNORMAL HIGH (ref 0.44–1.00)
GFR, Estimated: >60 mL/min (ref >60)
Glucose, Bld: 112 mg/dL — ABNORMAL HIGH (ref 70–99)
Potassium: 3.7 mmol/L (ref 3.5–5.1)
Sodium: 138 mmol/L (ref 135–145)
Total Bilirubin: 0.7 mg/dL (ref 0.0–1.2)
Total Protein: 7.6 g/dL (ref 6.5–8.1)

## 2024-02-02 LAB — CBC WITH DIFFERENTIAL/PLATELET
Abs Immature Granulocytes: 0 K/uL (ref 0.00–0.07)
Basophils Absolute: 0 K/uL (ref 0.0–0.1)
Basophils Relative: 1 %
Eosinophils Absolute: 0.3 K/uL (ref 0.0–0.5)
Eosinophils Relative: 6 %
HCT: 38.1 % (ref 36.0–46.0)
Hemoglobin: 12.5 g/dL (ref 12.0–15.0)
Immature Granulocytes: 0 %
Lymphocytes Relative: 43 %
Lymphs Abs: 2 K/uL (ref 0.7–4.0)
MCH: 30.9 pg (ref 26.0–34.0)
MCHC: 32.8 g/dL (ref 30.0–36.0)
MCV: 94.3 fL (ref 80.0–100.0)
Monocytes Absolute: 0.4 K/uL (ref 0.1–1.0)
Monocytes Relative: 10 %
Neutro Abs: 1.8 K/uL (ref 1.7–7.7)
Neutrophils Relative %: 40 %
Platelets: 196 K/uL (ref 150–400)
RBC: 4.04 MIL/uL (ref 3.87–5.11)
RDW: 12.7 % (ref 11.5–15.5)
WBC: 4.6 K/uL (ref 4.0–10.5)
nRBC: 0 % (ref 0.0–0.2)

## 2024-02-02 NOTE — Progress Notes (Signed)
 Patient is feeling a little under the weather as she is trying to get over a cold.   She also will be seeing a Neurologist sometime in November for possible neuropathy.

## 2024-02-02 NOTE — Progress Notes (Signed)
 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 Rilla. Her CBC in August 2024 showed a normal H&H of 12.8/29.6. White count and platelet counts were normal. Serum creatinine at 1.2. Total calcium  normal at 9.8. She had SPEP which showed possible M spike in the gamma globulin region which was identified as IgG kappa. SPEP was likely ordered due to mildly elevated creatinine of 1.2    Results of blood work from 01/16/2023 were as follows: 24-hour urine protein electrophoresis did not reveal any evidence of M protein.  Myeloma panel showed elevated IgG level of 1799 with a 0.9 IgG kappa light chain protein.  Serum kappa was mildly elevated at 28.7 with a free light chain ratio of 2.13.    Interval history-patient has been experiencing some tingling in her bilateral hands over the last few months and will be seen neurology to evaluate for neuropathy.  She has ongoing nasal congestion and and cough for the last 1 week and will be seeing her primary care doctor tomorrow.  Denies any changes in her appetite or weight.  Denies any new aches and pains anywhere  ECOG PS- 0 Pain scale- 0   Review of systems- Review of Systems  Constitutional:  Negative for chills, fever, malaise/fatigue and weight loss.  HENT:  Positive for congestion. Negative for ear discharge and nosebleeds.   Eyes:  Negative for blurred vision.  Respiratory:  Positive  for cough. Negative for hemoptysis, sputum production, shortness of breath and wheezing.   Cardiovascular:  Negative for chest pain, palpitations, orthopnea and claudication.  Gastrointestinal:  Negative for abdominal pain, blood in stool, constipation, diarrhea, heartburn, melena, nausea and vomiting.  Genitourinary:  Negative for dysuria, flank pain, frequency, hematuria and urgency.  Musculoskeletal:  Negative for back pain, joint pain and myalgias.  Skin:  Negative for rash.  Neurological:  Positive for sensory change. Negative for dizziness, tingling, focal weakness, seizures, weakness and headaches.  Endo/Heme/Allergies:  Does not bruise/bleed easily.  Psychiatric/Behavioral:  Negative for depression and suicidal ideas. The patient does not have insomnia.       Allergies  Allergen Reactions   Aleve [Naproxen Sodium] Hives, Itching and Swelling   Citrus Swelling     Past Medical History:  Diagnosis Date   Cervical radiculopathy    D[place]ky   Chronic kidney disease    RENAL INSUFF   CTS (carpal tunnel syndrome)    bilateral   Glaucoma    early stages (Dr. Mittie and Dr. Carolee)   History of measles    History of mumps    Hypertension    Obesity (BMI 30.0-34.9)    Umbilical hernia 2017   s/p repair     Past Surgical History:  Procedure Laterality Date   ANTERIOR CERVICAL DECOMP/DISCECTOMY FUSION N/A 06/08/2013   ANTERIOR CERVICAL DECOMPRESSION/DISCECTOMY FUSION 3 LEVELS;  Surgeon: Oneil Rodgers Priestly, MD;  Anterior cervical decompression fusion with instrumentation and allograft (C4-7)   CATARACT EXTRACTION, BILATERAL  05/2017   CHOLECYSTECTOMY N/A 02/28/2016  Procedure: LAPAROSCOPIC CHOLECYSTECTOMY WITH INTRAOPERATIVE CHOLANGIOGRAM;  Surgeon: Reyes LELON Cota, MD;  Location: [facility] ORS;  Service: General;  Laterality: N/A;   COLONOSCOPY  03/2013   int hem, 3 polyps no bx result available Cathe)   COLONOSCOPY WITH ESOPHAGOGASTRODUODENOSCOPY (EGD)  07/2018    colon - int hem, rpt 5 yrs, EGD - reflux esophagitis Cathe)   DEXA  11/2012   WNL   GLAUCOMA SURGERY Left    laser (Sun)   UMBILICAL HERNIA REPAIR N/A 02/28/2016   Procedure: PRIMARY REPAIR VENTRAL/ UMBILICAL ADULT;  Surgeon: Reyes LELON Cota, MD;  Location: [facility] ORS;  Service: General;  Laterality: N/A;    Social History   Socioeconomic History   Marital status: Married    Spouse name: Not on file   Number of children: Not on file   Years of education: Not on file   Highest education level: Not on file  Occupational History   Not on file  Tobacco Use   Smoking status: Never   Smokeless tobacco: Never  Vaping Use   Vaping status: Never Used  Substance and Sexual Activity   Alcohol use: No    Alcohol/week: 0.0 standard drinks of alcohol   Drug use: No   Sexual activity: Not Currently  Other Topics Concern   Not on file  Social History Narrative   Lives with Ubaldo Edis   G0   Married   Occupation: dispatcher   Edu: college   Activity: no regular exercise, does have treadmill and weight lifting bench   Diet: increasing water, fruits/vegetables daily   Social Drivers of Health   Financial Resource Strain: Not on file  Food Insecurity: Food Insecurity Present (01/16/2023)   Hunger Vital Sign    Worried About Running Out of Food in the Last Year: Never true    Ran Out of Food in the Last Year: Sometimes true  Transportation Needs: No Transportation Needs (01/16/2023)   PRAPARE - Administrator, Civil Service (Medical): No    Lack of Transportation (Non-Medical): No  Physical Activity: Not on file  Stress: Not on file  Social Connections: Not on file  Intimate Partner Violence: Not At Risk (01/16/2023)   Humiliation, Afraid, Rape, and Kick questionnaire    Fear of Current or Ex-Partner: No    Emotionally Abused: No    Physically Abused: No    Sexually Abused: No    Family History  Problem Relation Age of Onset   Glaucoma Mother    CAD Mother     Diabetes Mother    Stroke Mother    Cancer Father        unsure type   Hypertension Brother        and sister     Current Outpatient Medications:    acetaminophen  (TYLENOL ) 500 MG tablet, Take 1,000 mg by mouth daily., Disp: , Rfl:    amLODipine  (NORVASC ) 5 MG tablet, Take 1 tablet (5 mg total) by mouth daily., Disp: 90 tablet, Rfl: 4   atorvastatin  (LIPITOR) 20 MG tablet, Take 1 tablet (20 mg total) by mouth daily., Disp: 90 tablet, Rfl: 4   brimonidine (ALPHAGAN) 0.2 % ophthalmic solution, , Disp: , Rfl:    Cholecalciferol  (VITAMIN D -3) 1000 UNITS CAPS, Take 1,000 Units by mouth daily., Disp: , Rfl:    Coenzyme Q10 (COQ-10) 100 MG CAPS, Take 100 mg by mouth daily., Disp: , Rfl:    diclofenac  Sodium (VOLTAREN ) 1 % GEL, Apply 1 Application topically 3 (three) times  daily., Disp: 100 g, Rfl: 1   famotidine  (PEPCID ) 20 MG tablet, Take 1 tablet (20 mg total) by mouth 2 (two) times daily., Disp: 180 tablet, Rfl: 4   gabapentin  (NEURONTIN ) 300 MG capsule, Take 1 capsule (300 mg total) by mouth 2 (two) times daily., Disp: 180 capsule, Rfl: 1   latanoprost  (XALATAN ) 0.005 % ophthalmic solution, , Disp: , Rfl: 4   methocarbamol  (ROBAXIN ) 500 MG tablet, TAKE 1 TABLET(500 MG) BY MOUTH TWICE DAILY AS NEEDED FOR MUSCLE SPASMS, Disp: 30 tablet, Rfl: 3   Omega-3 Fatty Acids (FISH OIL) 1000 MG CAPS, Take 1 capsule by mouth daily., Disp: , Rfl:    traMADol  (ULTRAM ) 50 MG tablet, Take 1 tablet (50 mg total) by mouth every 8 (eight) hours as needed for moderate pain (pain score 4-6)., Disp: 20 tablet, Rfl: 0   predniSONE  (DELTASONE ) 20 MG tablet, Take two tablets daily for 3 days followed by one tablet daily for 4 days (Patient not taking: Reported on 02/02/2024), Disp: 10 tablet, Rfl: 0   timolol (TIMOPTIC) 0.5 % ophthalmic solution, , Disp: , Rfl: 4  Physical exam:  Vitals:   02/02/24 1424  BP: 130/84  Pulse: 75  Resp: 18  Temp: (!) 97.5 F (36.4 C)  TempSrc: Tympanic  SpO2: 100%  Weight: 210 lb  1.6 oz (95.3 kg)  Height: 5' 5 (1.651 m)   Physical Exam Cardiovascular:     Rate and Rhythm: Normal rate and regular rhythm.     Heart sounds: Normal heart sounds.  Pulmonary:     Effort: Pulmonary effort is normal.     Breath sounds: Normal breath sounds.  Abdominal:     General: Bowel sounds are normal.     Palpations: Abdomen is soft.  Skin:    General: Skin is warm and dry.  Neurological:     Mental Status: She is alert and oriented to person, place, and time.      I have personally reviewed labs listed below:    Latest Ref Rng & Units 02/02/2024    1:54 PM  CMP  Glucose 70 - 99 mg/dL 887   BUN 8 - 23 mg/dL 15   Creatinine 9.55 - 1.00 mg/dL 8.97   Sodium 864 - 854 mmol/L 138   Potassium 3.5 - 5.1 mmol/L 3.7   Chloride 98 - 111 mmol/L 104   CO2 22 - 32 mmol/L 27   Calcium  8.9 - 10.3 mg/dL 9.4   Total Protein 6.5 - 8.1 g/dL 7.6   Total Bilirubin 0.0 - 1.2 mg/dL 0.7   Alkaline Phos 38 - 126 U/L 60   AST 15 - 41 U/L 19   ALT 0 - 44 U/L 19       Latest Ref Rng & Units 02/02/2024    1:54 PM  CBC  WBC 4.0 - 10.5 K/uL 4.6   Hemoglobin 12.0 - 15.0 g/dL 87.4   Hematocrit 63.9 - 46.0 % 38.1   Platelets 150 - 400 K/uL 196       Assessment and plan- Patient is a 68 y.o. 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  Myeloma panel and serum free light chains are pending.  Levels from 6 months ago showed an M protein of 0.7 g stable as compared to her values from September 2024.  Serum free light chain ratio remains stable between 1.8-2.5.  I will see her  on a yearly basis at this time with CBC with differential CMP myeloma panel and serum free light chains.  It is unlikely that her neuropathy symptoms are secondary to MGUS   Visit Diagnosis 1. MGUS (monoclonal gammopathy of unknown significance)      Dr. Annah Skene, MD, MPH [facility] [phone] 02/02/2024 2:39 PM

## 2024-02-02 NOTE — Addendum Note (Signed)
 Addended by: Levonte Molina on: 02/02/2024 02:49 PM   Modules accepted: Orders

## 2024-02-03 ENCOUNTER — Ambulatory Visit (INDEPENDENT_AMBULATORY_CARE_PROVIDER_SITE_OTHER): Admitting: Nurse Practitioner

## 2024-02-03 ENCOUNTER — Ambulatory Visit (INDEPENDENT_AMBULATORY_CARE_PROVIDER_SITE_OTHER)
Admission: RE | Admit: 2024-02-03 | Discharge: 2024-02-03 | Disposition: A | Discharge status: Home / Self Care | Source: Ambulatory Visit | Attending: Nurse Practitioner | Admitting: Nurse Practitioner

## 2024-02-03 ENCOUNTER — Encounter: Payer: Self-pay | Admitting: Nurse Practitioner

## 2024-02-03 VITALS — BP 124/78 | HR 74 | Temp 98.1°F | Ht 65.0 in | Wt 209.8 lb

## 2024-02-03 DIAGNOSIS — R0602 Shortness of breath: Secondary | ICD-10-CM | POA: Diagnosis not present

## 2024-02-03 DIAGNOSIS — J40 Bronchitis, not specified as acute or chronic: Secondary | ICD-10-CM | POA: Insufficient documentation

## 2024-02-03 DIAGNOSIS — R062 Wheezing: Secondary | ICD-10-CM

## 2024-02-03 DIAGNOSIS — R051 Acute cough: Secondary | ICD-10-CM

## 2024-02-03 DIAGNOSIS — R059 Cough, unspecified: Secondary | ICD-10-CM | POA: Diagnosis not present

## 2024-02-03 DIAGNOSIS — I771 Stricture of artery: Secondary | ICD-10-CM | POA: Diagnosis not present

## 2024-02-03 DIAGNOSIS — I700 Atherosclerosis of aorta: Secondary | ICD-10-CM | POA: Diagnosis not present

## 2024-02-03 LAB — KAPPA/LAMBDA LIGHT CHAINS
Kappa free light chain: 33.5 mg/L — ABNORMAL HIGH (ref 3.3–19.4)
Kappa, lambda light chain ratio: 1.98 — ABNORMAL HIGH (ref 0.26–1.65)
Lambda free light chains: 16.9 mg/L (ref 5.7–26.3)

## 2024-02-03 MED ORDER — PREDNISONE 20 MG PO TABS: 40.0000 mg | ORAL_TABLET | Freq: Every day | ORAL | 0 refills | Status: AC

## 2024-02-03 MED ORDER — GUAIFENESIN-CODEINE 100-10 MG/5ML PO SOLN: 5.0000 mL | Freq: Three times a day (TID) | ORAL | 0 refills | Status: AC | PRN

## 2024-02-03 NOTE — Assessment & Plan Note (Signed)
 Suspect bronchitis secondary to viral infection.  Wheezing globally on exam pending chest x-ray will do prednisone  and cough syrup for now.  Patient did at home COVID test that was negative

## 2024-02-03 NOTE — Assessment & Plan Note (Signed)
 Prednisone  40 mg daily for 5 days take with food

## 2024-02-03 NOTE — Assessment & Plan Note (Signed)
 Codeine -guaifenesin  cough syrup 3 times daily as needed sedation precautions reviewed pending chest x-ray

## 2024-02-03 NOTE — Patient Instructions (Signed)
 Nice to see you today Take the prednisone  in the morning with food. You can take your first dose today with lunch I have sent in the cough syrup. DO NOT take it with the Nyquil I will be in touch with the xray once I have it Follow up if you do not improve

## 2024-02-03 NOTE — Assessment & Plan Note (Signed)
 Pending chest x-ray prednisone  as prescribed

## 2024-02-03 NOTE — Progress Notes (Signed)
 Acute Office Visit  Subjective:     Patient ID: Martha Wade, female    DOB: 05/15/1955, 68 y.o.   MRN: [id number]  Chief Complaint  Patient presents with   URI    Pt complains of having cold symptoms that started last wed/Thursday. Coughing with greyish phlem, pt states chest feels like something is there (tightness). Slight sinus pressure.       Patient is in today for sick symptoms with a history HTN, GERD, CKD, monoclonal gammopathy.  Patient is a never smoker  COVID-vaccine: Original series and boosters Flu vaccine updated in 2024  Symptoms started about a week ago Does not think she had a sick contact but is unsure.  Staste that she went to some events the previous weekend She did an at home covid test that was neggatvie  She has tired nyquil. States that she feels like it helped some.   Review of Systems  Constitutional:  Positive for malaise/fatigue. Negative for chills and fever.  HENT:  Positive for sore throat (better). Negative for ear discharge and ear pain.   Respiratory:  Positive for cough and sputum production.   Gastrointestinal:  Negative for abdominal pain, diarrhea, nausea and vomiting.  Neurological:  Positive for headaches (in beginning).        Objective:    BP 124/78   Pulse 74   Temp 98.1 F (36.7 C) (Oral)   Ht 5' 5 (1.651 m)   Wt 209 lb 12.8 oz (95.2 kg)   LMP 11/20/2015   SpO2 97%   BMI 34.91 kg/m  BP Readings from Last 3 Encounters:  02/03/24 124/78  02/02/24 130/84  01/12/24 122/80   Wt Readings from Last 3 Encounters:  02/03/24 209 lb 12.8 oz (95.2 kg)  02/02/24 210 lb 1.6 oz (95.3 kg)  01/12/24 208 lb 4 oz (94.5 kg)   SpO2 Readings from Last 3 Encounters:  02/03/24 97%  02/02/24 100%  01/12/24 97%      Physical Exam Vitals and nursing note reviewed.  Constitutional:      Appearance: Normal appearance.  HENT:     Right Ear: Tympanic membrane, ear canal and external ear normal.     Left Ear: Tympanic membrane,  ear canal and external ear normal.     Nose:     Right Sinus: Maxillary sinus tenderness present. No frontal sinus tenderness.     Left Sinus: No maxillary sinus tenderness or frontal sinus tenderness.     Mouth/Throat:     Mouth: Mucous membranes are moist.     Pharynx: Oropharynx is clear.  Cardiovascular:     Rate and Rhythm: Normal rate and regular rhythm.     Heart sounds: Normal heart sounds.  Pulmonary:     Effort: Pulmonary effort is normal.     Breath sounds: Wheezing present.  Lymphadenopathy:     Cervical: Cervical adenopathy present.  Neurological:     Mental Status: She is alert.     No results found for any visits on 02/03/24.      Assessment & Plan:   Problem List Items Addressed This Visit       Respiratory   Bronchitis   Suspect bronchitis secondary to viral infection.  Wheezing globally on exam pending chest x-ray will do prednisone  and cough syrup for now.  Patient did at home COVID test that was negative      Relevant Medications   predniSONE  (DELTASONE ) 20 MG tablet   Other Relevant Orders   DG  Chest 2 View     Other   Wheezing - Primary   Prednisone  40 mg daily for 5 days take with food      Relevant Medications   predniSONE  (DELTASONE ) 20 MG tablet   Other Relevant Orders   DG Chest 2 View   Shortness of breath   Pending chest x-ray prednisone  as prescribed      Relevant Medications   predniSONE  (DELTASONE ) 20 MG tablet   Other Relevant Orders   DG Chest 2 View   Acute cough   Codeine -guaifenesin  cough syrup 3 times daily as needed sedation precautions reviewed pending chest x-ray      Relevant Medications   guaiFENesin -codeine  100-10 MG/5ML syrup   Other Relevant Orders   DG Chest 2 View    Meds ordered this encounter  Medications   predniSONE  (DELTASONE ) 20 MG tablet    Sig: Take 2 tablets (40 mg total) by mouth daily with breakfast for 5 days.    Dispense:  10 tablet    Refill:  0    Supervising Provider:   RANDEEN HARDY  A [[id number]]   guaiFENesin -codeine  100-10 MG/5ML syrup    Sig: Take 5 mLs by mouth 3 (three) times daily as needed.    Dispense:  120 mL    Refill:  0    Supervising Provider:   RANDEEN HARDY A [[id number]]    Return if symptoms worsen or fail to improve.  Adina Crandall, NP

## 2024-02-04 ENCOUNTER — Ambulatory Visit: Payer: Self-pay | Admitting: Nurse Practitioner

## 2024-02-05 ENCOUNTER — Ambulatory Visit: Payer: Self-pay

## 2024-02-05 LAB — MULTIPLE MYELOMA PANEL, SERUM
Albumin SerPl Elph-Mcnc: 3.4 g/dL (ref 2.9–4.4)
Albumin/Glob SerPl: 0.9 (ref 0.7–1.7)
Alpha 1: 0.2 g/dL (ref 0.0–0.4)
Alpha2 Glob SerPl Elph-Mcnc: 0.7 g/dL (ref 0.4–1.0)
B-Globulin SerPl Elph-Mcnc: 1.4 g/dL — ABNORMAL HIGH (ref 0.7–1.3)
Gamma Glob SerPl Elph-Mcnc: 1.5 g/dL (ref 0.4–1.8)
Globulin, Total: 3.8 g/dL (ref 2.2–3.9)
IgA: 486 mg/dL — ABNORMAL HIGH (ref 87–352)
IgG (Immunoglobin G), Serum: 1610 mg/dL — ABNORMAL HIGH (ref 586–1602)
IgM (Immunoglobulin M), Srm: 73 mg/dL (ref 26–217)
M Protein SerPl Elph-Mcnc: 0.8 g/dL — ABNORMAL HIGH
Total Protein ELP: 7.2 g/dL (ref 6.0–8.5)

## 2024-02-05 NOTE — Telephone Encounter (Signed)
 No appointments available in PCP office or surrounding offices in the region within the next 24 hours. Patient advised Urgent Care as an alternative. She is not sure if she will go yet or not but she did agree to go to the Emergency Room if things got worse.   FYI Only or Action Required?: FYI only for provider.  Patient was last seen in primary care on 02/03/2024 by Wendee Lynwood HERO, NP.  Called Nurse Triage reporting Urinary Frequency.  Symptoms began last night.  Interventions attempted: Other: Patient drinking plenty of water.  Symptoms are: unchanged.  Triage Disposition: See Physician Within 24 Hours  Patient/caregiver understands and will follow disposition?: Unsure       Reason for Disposition  Age > 50 years  Answer Assessment - Initial Assessment Questions Patient has been wearing a heavy pad due to having a cough for any urinary leakage for the past week. Patient was seen for the cough/cold symptoms. Burning with urination & frequency Patient is advised that there are no openings in her PCP office or surrounding offices today. She is also advised that Urgent Care is an alternative. Patient states that she may go to Urgent Care--she isnt sure yet Patient is advised that if anything worsens to go to the Emergency Room. Patient verbalized understanding.    1. SEVERITY: How bad is the pain?  (e.g., Scale 1-10; mild, moderate, or severe)     ------- 2. FREQUENCY: How many times have you had painful urination today?      ----- 3. PATTERN: Is pain present every time you urinate or just sometimes?      At the end of urination 4. ONSET: When did the painful urination start?      yesterday 5. FEVER: Do you have a fever? If Yes, ask: What is your temperature, how was it measured, and when did it start?     Pt states she hasn't checked but she doesn't think so 6. PAST UTI: Have you had a urine infection before? If Yes, ask: When was the last time? and  What happened that time?      ------- 7. CAUSE: What do you think is causing the painful urination?  (e.g., UTI, scratch, Herpes sore)     ------- 8. OTHER SYMPTOMS: Do you have any other symptoms? (e.g., blood in urine, flank pain, genital sores, urgency, vaginal discharge)     frequency  Protocols used: Urination Pain - Female-A-AH

## 2024-02-05 NOTE — Telephone Encounter (Signed)
 I have not had functioning wifi so was not able to review message until now.  Agree with UCC eval today or over weekend given dysuria.

## 2024-02-09 NOTE — Telephone Encounter (Signed)
 I dont see where she was evaluated for this. Can we call tomorrow for update on urinary symptoms? If ongoing, rec OV for eval with next available provider.

## 2024-02-10 NOTE — Telephone Encounter (Addendum)
 Spoke with pt asking about sxs. States she is doing much better. Says she increased her water that was suggested and what sxs she was having have resolved. Encouraged pt to call our of office for an appt if sxs return. Pt verbalizes understanding.

## 2024-02-23 ENCOUNTER — Other Ambulatory Visit: Payer: Self-pay | Admitting: Family Medicine

## 2024-02-23 DIAGNOSIS — K2100 Gastro-esophageal reflux disease with esophagitis, without bleeding: Secondary | ICD-10-CM

## 2024-02-23 NOTE — Telephone Encounter (Signed)
 E-scribed refill.  Pls schedule CPE and fasting labs for additional refills.

## 2024-02-24 ENCOUNTER — Other Ambulatory Visit: Payer: Self-pay

## 2024-02-24 DIAGNOSIS — R202 Paresthesia of skin: Secondary | ICD-10-CM

## 2024-03-16 ENCOUNTER — Other Ambulatory Visit: Payer: Self-pay | Admitting: Family Medicine

## 2024-03-18 ENCOUNTER — Ambulatory Visit: Admitting: Neurology

## 2024-03-18 DIAGNOSIS — R202 Paresthesia of skin: Secondary | ICD-10-CM

## 2024-03-18 DIAGNOSIS — G5603 Carpal tunnel syndrome, bilateral upper limbs: Secondary | ICD-10-CM

## 2024-03-18 NOTE — Procedures (Signed)
 [facility]  [address] [place], [address]  [place], [place] [address] Tel: [phone] Fax: [phone] Test Date:  03/18/2024  Patient: Martha Wade DOB: 1956/01/03 Physician: Tonita Blanch, DO  Sex: Female Height: 5' 5 Ref Phys: Anton Blas, MD  ID#: [id number]   Technician:    History: This is a 68 year old female referred for evaluation of bilateral hand paresthesias.  NCV & EMG Findings: Extensive electrodiagnostic testing of the right upper extremity and additional studies of the left shows:  Right median sensory response shows prolonged latency (5.3 ms).  Left median sensory response shows prolonged latency (5.8 ms) and reduced amplitude (9.1 V).  Bilateral ulnar sensory responses are within normal limits. Bilateral median motor responses show prolonged latency (R4.9, L5.5 ms).  Of note, there is a left Martin-Gruber anastomosis, a normal anatomic variant.  Bilateral ulnar motor responses are within normal limits. There is no evidence of active or chronic motor axonal loss changes affecting any of the tested muscles.  Motor unit configuration and recruitment pattern is within normal limits.  Impression: Bilateral median neuropathy at or distal to the wrist, consistent with a clinical diagnosis of carpal tunnel syndrome.  Overall, these findings are moderate in degree electrically and worse on the left.   ___________________________ Tonita Blanch, DO    Nerve Conduction Studies   Stim Site NR Peak (ms) Norm Peak (ms) O-P Amp (V) Norm O-P Amp  Left Median Anti Sensory (2nd Digit)  32 C  Wrist    *5.8 <3.8 *9.1 >10  Right Median Anti Sensory (2nd Digit)  32 C  Wrist    *5.3 <3.8 13.3 >10  Left Ulnar Anti Sensory (5th Digit)  32 C  Wrist    2.8 <3.2 30.3 >5  Right Ulnar Anti Sensory (5th Digit)  32 C  Wrist    2.9 <3.2 36.4 >5     Stim Site NR Onset (ms) Norm Onset (ms) O-P Amp (mV) Norm O-P Amp Site1 Site2 Delta-0 (ms) Dist (cm) Vel (m/s) Norm Vel  (m/s)  Left Median Motor (Abd Poll Brev)  32 C  Wrist    *5.5 <4.0 9.7 >5 Elbow Wrist 5.0 30.0 60 >50  Elbow    10.5  10.3  Ulnar-wrist crossover Elbow 6.6 0.0    Ulnar-wrist crossover    3.9  4.3         Right Median Motor (Abd Poll Brev)  32 C  Wrist    *4.9 <4.0 13.4 >5 Elbow Wrist 5.2 30.0 58 >50  Elbow    10.1  13.0         Left Ulnar Motor (Abd Dig Minimi)  32 C  Wrist    2.4 <3.1 10.1 >7 B Elbow Wrist 3.5 20.0 57 >50  B Elbow    5.9  8.7  A Elbow B Elbow 1.9 10.0 53 >50  A Elbow    7.8  8.0         Right Ulnar Motor (Abd Dig Minimi)  32 C  Wrist    2.6 <3.1 12.6 >7 B Elbow Wrist 3.7 21.0 57 >50  B Elbow    6.3  11.6  A Elbow B Elbow 1.7 10.0 59 >50  A Elbow    8.0  11.2          Electromyography   Side Muscle Ins.Act Fibs Fasc Recrt Amp Dur Poly Activation Comment  Right 1stDorInt Nml Nml Nml Nml Nml Nml Nml Nml N/A  Right Abd Poll Brev Nml  Nml Nml Nml Nml Nml Nml Nml N/A  Right PronatorTeres Nml Nml Nml Nml Nml Nml Nml Nml N/A  Right Biceps Nml Nml Nml Nml Nml Nml Nml Nml N/A  Right Triceps Nml Nml Nml Nml Nml Nml Nml Nml N/A  Right Deltoid Nml Nml Nml Nml Nml Nml Nml Nml N/A  Left 1stDorInt Nml Nml Nml Nml Nml Nml Nml Nml N/A  Left Abd Poll Brev Nml Nml Nml Nml Nml Nml Nml Nml N/A  Left PronatorTeres Nml Nml Nml Nml Nml Nml Nml Nml N/A  Left Biceps Nml Nml Nml Nml Nml Nml Nml Nml N/A  Left Triceps Nml Nml Nml Nml Nml Nml Nml Nml N/A  Left Deltoid Nml Nml Nml Nml Nml Nml Nml Nml N/A      Waveforms:

## 2024-03-19 ENCOUNTER — Other Ambulatory Visit: Payer: Self-pay | Admitting: Family Medicine

## 2024-03-19 ENCOUNTER — Ambulatory Visit: Payer: Self-pay | Admitting: Family Medicine

## 2024-03-19 DIAGNOSIS — G5603 Carpal tunnel syndrome, bilateral upper limbs: Secondary | ICD-10-CM

## 2024-03-19 DIAGNOSIS — R202 Paresthesia of skin: Secondary | ICD-10-CM

## 2024-04-15 ENCOUNTER — Encounter: Admitting: Family Medicine

## 2024-04-25 ENCOUNTER — Encounter: Payer: Self-pay | Admitting: Family Medicine

## 2024-04-25 DIAGNOSIS — I517 Cardiomegaly: Secondary | ICD-10-CM | POA: Insufficient documentation

## 2024-04-25 DIAGNOSIS — M4810 Ankylosing hyperostosis [Forestier], site unspecified: Secondary | ICD-10-CM | POA: Insufficient documentation

## 2024-04-27 ENCOUNTER — Encounter: Payer: Self-pay | Admitting: Family Medicine

## 2024-04-27 ENCOUNTER — Ambulatory Visit: Admitting: Family Medicine

## 2024-04-27 VITALS — BP 118/80 | HR 95 | Temp 98.3°F | Ht 65.0 in | Wt 210.8 lb

## 2024-04-27 DIAGNOSIS — G5603 Carpal tunnel syndrome, bilateral upper limbs: Secondary | ICD-10-CM

## 2024-04-27 DIAGNOSIS — I10 Essential (primary) hypertension: Secondary | ICD-10-CM | POA: Diagnosis not present

## 2024-04-27 DIAGNOSIS — K2100 Gastro-esophageal reflux disease with esophagitis, without bleeding: Secondary | ICD-10-CM

## 2024-04-27 DIAGNOSIS — Z23 Encounter for immunization: Secondary | ICD-10-CM | POA: Diagnosis not present

## 2024-04-27 DIAGNOSIS — M4810 Ankylosing hyperostosis [Forestier], site unspecified: Secondary | ICD-10-CM

## 2024-04-27 DIAGNOSIS — Z0000 Encounter for general adult medical examination without abnormal findings: Secondary | ICD-10-CM | POA: Diagnosis not present

## 2024-04-27 DIAGNOSIS — R7989 Other specified abnormal findings of blood chemistry: Secondary | ICD-10-CM | POA: Diagnosis not present

## 2024-04-27 DIAGNOSIS — N183 Chronic kidney disease, stage 3 unspecified: Secondary | ICD-10-CM

## 2024-04-27 DIAGNOSIS — I517 Cardiomegaly: Secondary | ICD-10-CM

## 2024-04-27 DIAGNOSIS — E785 Hyperlipidemia, unspecified: Secondary | ICD-10-CM

## 2024-04-27 DIAGNOSIS — E559 Vitamin D deficiency, unspecified: Secondary | ICD-10-CM

## 2024-04-27 DIAGNOSIS — D472 Monoclonal gammopathy: Secondary | ICD-10-CM

## 2024-04-27 DIAGNOSIS — Z7189 Other specified counseling: Secondary | ICD-10-CM | POA: Insufficient documentation

## 2024-04-27 LAB — MICROALBUMIN / CREATININE URINE RATIO
Creatinine,U: 37.3 mg/dL
Microalb Creat Ratio: UNDETERMINED mg/g (ref 0.0–30.0)
Microalb, Ur: <0.7 mg/dL

## 2024-04-27 MED ORDER — GABAPENTIN 300 MG PO CAPS: 300.0000 mg | ORAL_CAPSULE | Freq: Two times a day (BID) | ORAL | 3 refills | Status: AC

## 2024-04-27 MED ORDER — ATORVASTATIN CALCIUM 20 MG PO TABS: 20.0000 mg | ORAL_TABLET | Freq: Every day | ORAL | 3 refills | Status: AC

## 2024-04-27 MED ORDER — AMLODIPINE BESYLATE 5 MG PO TABS: 5.0000 mg | ORAL_TABLET | Freq: Every day | ORAL | 3 refills | Status: AC

## 2024-04-27 NOTE — Assessment & Plan Note (Signed)
 Chronic, stable. Continue current regimen.

## 2024-04-27 NOTE — Assessment & Plan Note (Signed)
 Update levels on vit D 1000 units daily.

## 2024-04-27 NOTE — Assessment & Plan Note (Signed)
 Advanced directive - does not have. Would want Ubaldo husband or sister Ronal Gowers to be HCPOA. Packet provided today.

## 2024-04-27 NOTE — Assessment & Plan Note (Signed)
 Chronic on atorva - continue. Update FLP. The 10-year ASCVD risk score (Arnett DK, et al., 2019) is: 7.3%   Values used to calculate the score:     Age: 68 years     Clinically relevant sex: Female     Is Non-Hispanic African American: Yes     Diabetic: No     Tobacco smoker: No     Systolic Blood Pressure: 118 mmHg     Is BP treated: Yes     HDL Cholesterol: 43.1 mg/dL     Total Cholesterol: 163 mg/dL

## 2024-04-27 NOTE — Assessment & Plan Note (Signed)
 Continues pepcid . Avoiding PPI in CKD.

## 2024-04-27 NOTE — Progress Notes (Signed)
 Chief Complaint  Patient presents with   Annual Exam    FYI Pt has nerve conductive study done and has eval scheduled on 12/23     Subjective:   Martha Wade is a 68 y.o. female who presents for a Welcome to Medicare Exam.   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 Pending hand surgeon eval [facility] at [facility]). Also having L triggering of thumb.    Preventative: COLONOSCOPY WITH ESOPHAGOGASTRODUODENOSCOPY (EGD) 07/2018 - colon - int hem, rpt 5 yrs, EGD - reflux esophagitis Cathe)  Well woman - last pap 09/2015 - WNL except endocervical zone absent. Has seen [facility], Last seen 2019 for PMB.   Mammogram - 08/2023 Birads1 @ [facility]  DEXA 11/2012 - WNL  Lung cancer screening - not eligible  Flu shot - today COVID vaccine Pfizer 07/2019, 08/2019, booster 02/2020 Tdap 12/2012  Prevnar-20 today  Shingrix - discussed to check with pharmacy  RSV - discussed Advanced directive - does not have. Would want Ubaldo husband or sister Ronal Gowers to be HCPOA. Packet provided today.  Seat belt use discussed Sunscreen use discussed, no changing moles on skin.  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  Edu: college Activity: yardwork, started walking at work and using [employer] lifting bench  Diet: increasing water, fruits/vegetables daily  Visit info / Clinical Intake: Medicare Wellness Visit Type:: Welcome to [employer]OVERNMENT SOCIAL RESEARCH OFFICER) Persons participating in visit and providing information:: patient Medicare Wellness Visit Mode:: In-person (required for [employer]) Interpreter Needed?: No Pre-visit  prep was completed: no AWV questionnaire completed by patient prior to visit?: no Living arrangements:: lives with spouse/significant other Patient's Overall Health Status Rating: good Typical amount of pain: some Does pain affect daily life?: (!) yes Are you currently prescribed opioids?: no  Dietary Habits and Nutritional Risks How many meals a day?: 2 Eats fruit and vegetables daily?: (!) no Most meals are obtained by: preparing own meals In the last 2 weeks, have you had any of the following?: none Diabetic:: no  Functional Status Activities of Daily Living (to include ambulation/medication): Independent Ambulation: Independent Medication Administration: Independent Home Management (perform basic housework or laundry): Independent Manage your own finances?: yes Primary transportation is: driving Concerns about vision?: no *vision screening is required for WTM* Concerns about hearing?: no  Fall Screening Falls in the past year?: 0 Number of falls in past year: 0 Was there an injury with Fall?: 0 Fall Risk Category Calculator: 0 Patient Fall Risk Level: Low Fall Risk  Fall Risk Patient at Risk for Falls Due to: No Fall Risks Fall risk Follow up: Falls evaluation completed  Home and Transportation Safety: All rugs have non-skid backing?: yes All stairs or steps have railings?: yes Grab bars in the bathtub or shower?: (!) no Have non-skid surface in bathtub or shower?: (!) no Good home lighting?: (!) no Regular seat belt use?: yes Hospital stays in the last year:: no  Cognitive Assessment Difficulty concentrating, remembering, or making decisions? : no Will 6CIT or Mini Cog be Completed: no 6CIT or Mini Cog Declined: patient alert, oriented, able to answer  questions appropriately and recall recent events  Advance Directives (For Healthcare) Does Patient Have a Medical Advance Directive?: No Would patient like information on creating a medical advance directive?:  Yes (ED - Information included in AVS)  Reviewed/Updated  Reviewed/Updated: Reviewed All (Medical, Surgical, Family, Medications, Allergies, Care Teams, Patient Goals)    Allergies (verified) Aleve [naproxen sodium] and Citrus   Current Medications (verified) Outpatient Encounter Medications as of 04/27/2024  Medication Sig   acetaminophen  (TYLENOL ) 500 MG tablet Take 1,000 mg by mouth daily. (Patient taking differently: Take 1,000 mg by mouth as needed.)   brimonidine (ALPHAGAN) 0.2 % ophthalmic solution    Cholecalciferol  (VITAMIN D -3) 1000 UNITS CAPS Take 1,000 Units by mouth daily.   Coenzyme Q10 (COQ-10) 100 MG CAPS Take 100 mg by mouth daily. (Patient taking differently: Take 100 mg by mouth as needed.)   diclofenac  Sodium (VOLTAREN ) 1 % GEL Apply 1 Application topically 3 (three) times daily. (Patient taking differently: Apply 1 Application topically as needed.)   famotidine  (PEPCID ) 20 MG tablet TAKE 1 TABLET(20 MG) BY MOUTH TWICE DAILY   latanoprost  (XALATAN ) 0.005 % ophthalmic solution    methocarbamol  (ROBAXIN ) 500 MG tablet TAKE 1 TABLET(500 MG) BY MOUTH TWICE DAILY AS NEEDED FOR MUSCLE SPASMS   Omega-3 Fatty Acids (FISH OIL) 1000 MG CAPS Take 1 capsule by mouth daily. (Patient taking differently: Take 1 capsule by mouth as needed.)   traMADol  (ULTRAM ) 50 MG tablet Take 1 tablet (50 mg total) by mouth every 8 (eight) hours as needed for moderate pain (pain score 4-6).   [DISCONTINUED] amLODipine  (NORVASC ) 5 MG tablet TAKE 1 TABLET(5 MG) BY MOUTH DAILY   [DISCONTINUED] atorvastatin  (LIPITOR) 20 MG tablet TAKE 1 TABLET(20 MG) BY MOUTH DAILY   [DISCONTINUED] gabapentin  (NEURONTIN ) 300 MG capsule Take 1 capsule (300 mg total) by mouth 2 (two) times daily.   amLODipine  (NORVASC ) 5 MG tablet Take 1 tablet (5 mg total) by mouth daily.   atorvastatin  (LIPITOR) 20 MG tablet Take 1 tablet (20 mg total) by mouth daily.   gabapentin  (NEURONTIN ) 300 MG capsule Take 1 capsule (300 mg total)  by mouth 2 (two) times daily.   guaiFENesin -codeine  100-10 MG/5ML syrup Take 5 mLs by mouth 3 (three) times daily as needed. (Patient not taking: Reported on 04/27/2024)   timolol (TIMOPTIC) 0.5 % ophthalmic solution  (Patient not taking: Reported on 04/27/2024)   No facility-administered encounter medications on file as of 04/27/2024.    History: Past Medical History:  Diagnosis Date   Cervical radiculopathy    [place]   Chronic kidney disease    RENAL INSUFF   CTS (carpal tunnel syndrome)    bilateral   Glaucoma    early stages (Dr. Mittie and Dr. Carolee)   History of measles    History of mumps    Hypertension    Obesity (BMI 30.0-34.9)    Skin lesion of cheek 11/05/2022   Removed by derm - wart (2024)     Umbilical hernia 2017   s/p repair   Past Surgical History:  Procedure Laterality Date   ANTERIOR CERVICAL DECOMP/DISCECTOMY FUSION N/A 06/08/2013   ANTERIOR CERVICAL DECOMPRESSION/DISCECTOMY FUSION 3 LEVELS;  Surgeon: Oneil Rodgers Priestly, MD;  Anterior cervical decompression fusion with instrumentation and allograft (C4-7)   CATARACT EXTRACTION, BILATERAL  05/2017   CHOLECYSTECTOMY N/A 02/28/2016   Procedure: LAPAROSCOPIC CHOLECYSTECTOMY WITH INTRAOPERATIVE CHOLANGIOGRAM;  Surgeon: Reyes LELON Cota, MD;  Location: [facility] OR[facility];  Service: General;  Laterality: N/A;   COLONOSCOPY  03/2013  int hem, 3 polyps no bx result available Cathe)   COLONOSCOPY WITH ESOPHAGOGASTRODUODENOSCOPY (EGD)  07/2018   colon - int hem, rpt 5 yrs, EGD - reflux esophagitis Cathe)   DEXA  11/2012   WNL   GLAUCOMA SURGERY Left    laser (Sun)   UMBILICAL HERNIA REPAIR N/A 02/28/2016   Procedure: PRIMARY REPAIR VENTRAL/ UMBILICAL ADULT;  Surgeon: Reyes LELON Cota, MD;  Location: [facility] OR[facility];  Service: General;  Laterality: N/A;   Family History  Problem Relation Age of Onset   Glaucoma Mother    CAD Mother    Diabetes Mother    Stroke Mother    Cancer Father        unsure type    Hypertension Brother        and sister   Social History   Occupational History   Not on file  Tobacco Use   Smoking status: Never   Smokeless tobacco: Never  Vaping Use   Vaping status: Never Used  Substance and Sexual Activity   Alcohol use: No    Alcohol/week: 0.0 standard drinks of alcohol   Drug use: No   Sexual activity: Not Currently   Tobacco Counseling Counseling given: Not Answered  SDOH Screenings   Food Insecurity: No Food Insecurity (04/27/2024)  Housing: Low Risk (04/27/2024)  Transportation Needs: No Transportation Needs (04/27/2024)  Utilities: Not At Risk (04/27/2024)  Depression (PHQ2-9): Low Risk (04/27/2024)  Physical Activity: Sufficiently Active (04/27/2024)  Social Connections: Moderately Integrated (04/27/2024)  Stress: No Stress Concern Present (04/27/2024)  Tobacco Use: Low Risk (04/27/2024)  Health Literacy: Adequate Health Literacy (04/27/2024)   See flowsheets for full screening details  Depression Screen PHQ 2 & 9 Depression Scale- Over the past 2 weeks, how often have you been bothered by any of the following problems? Little interest or pleasure in doing things: 0 Feeling down, depressed, or hopeless (PHQ Adolescent also includes...irritable): 0 PHQ-2 Total Score: 0 Trouble falling or staying asleep, or sleeping too much: 0 Feeling tired or having little energy: 0 Poor appetite or overeating (PHQ Adolescent also includes...weight loss): 0 Feeling bad about yourself - or that you are a failure or have let yourself or your family down: 0 Trouble concentrating on things, such as reading the newspaper or watching television (PHQ Adolescent also includes...like school work): 0 Moving or speaking so slowly that other people could have noticed. Or the opposite - being so fidgety or restless that you have been moving around a lot more than usual: 0 Thoughts that you would be better off dead, or of hurting yourself in some way: 0 PHQ-9 Total  Score: 0 If you checked off any problems, how difficult have these problems made it for you to do your work, take care of things at home, or get along with other people?: Not difficult at all      Goals Addressed             This Visit's Progress    loose Weight       Eat healthy             Objective:    Today's Vitals   04/27/24 1304  BP: 118/80  Pulse: 95  Temp: 98.3 F (36.8 C)  TempSrc: Oral  SpO2: 97%  Weight: 210 lb 12.8 oz (95.6 kg)  Height: 5' 5 (1.651 m)   Body mass index is 35.08 kg/m.   Wt Readings from Last 3 Encounters:  04/27/24 210 lb 12.8 oz (95.6 kg)  02/03/24 209 lb 12.8 oz (95.2 kg)  02/02/24 210 lb 1.6 oz (95.3 kg)   Physical Exam Vitals and nursing note reviewed.  Constitutional:      Appearance: Normal appearance. She is not ill-appearing.  HENT:     Head: Normocephalic and atraumatic.     Right Ear: Tympanic membrane, ear canal and external ear normal. There is no impacted cerumen.     Left Ear: Tympanic membrane, ear canal and external ear normal. There is no impacted cerumen.     Mouth/Throat:     Mouth: Mucous membranes are moist.     Pharynx: Oropharynx is clear. No oropharyngeal exudate or posterior oropharyngeal erythema.  Eyes:     General:        Right eye: No discharge.        Left eye: No discharge.     Extraocular Movements: Extraocular movements intact.     Conjunctiva/sclera: Conjunctivae normal.     Pupils: Pupils are equal, round, and reactive to light.  Neck:     Thyroid : No thyroid  mass or thyromegaly.     Vascular: No carotid bruit.  Cardiovascular:     Rate and Rhythm: Normal rate and regular rhythm.     Pulses: Normal pulses.     Heart sounds: Normal heart sounds. No murmur heard. Pulmonary:     Effort: Pulmonary effort is normal. No respiratory distress.     Breath sounds: Normal breath sounds. No wheezing, rhonchi or rales.  Abdominal:     General: Bowel sounds are normal. There is no distension.      Palpations: Abdomen is soft. There is no mass.     Tenderness: There is no abdominal tenderness. There is no guarding or rebound.     Hernia: No hernia is present.  Musculoskeletal:     Cervical back: Normal range of motion and neck supple. No rigidity.     Right lower leg: No edema.     Left lower leg: No edema.  Lymphadenopathy:     Cervical: No cervical adenopathy.  Skin:    General: Skin is warm and dry.     Findings: No rash.  Neurological:     General: No focal deficit present.     Mental Status: She is alert. Mental status is at baseline.  Psychiatric:        Mood and Affect: Mood normal.        Behavior: Behavior normal.       Hearing/Vision screen Hearing Screening   250Hz  500Hz  1000Hz  2000Hz  3000Hz  4000Hz  5000Hz  6000Hz  8000Hz   Right ear 40 30 20 20 20 20 20 20 20   Left ear 40 30 20 20 20 20 20 20 20    Vision Screening   Right eye Left eye Both eyes  Without correction 20/20 20/30 20/20   With correction      Immunizations and Health Maintenance Health Maintenance  Topic Date Due   Zoster Vaccines- Shingrix (1 of 2) Never done   DTaP/Tdap/Td (2 - Td or Tdap) 12/11/2022   Colonoscopy  07/15/2023   COVID-19 Vaccine (5 - 2025-26 season) 01/11/2024   Mammogram  08/23/2024   Medicare Annual Wellness (AWV)  04/27/2025   Pneumococcal Vaccine: 50+ Years  Completed   Influenza Vaccine  Completed   Bone Density Scan  Completed   Hepatitis C Screening  Completed   Meningococcal B Vaccine  Aged Out    EKG: unchanged from previous tracings, normal sinus rhythm, possible Q waves in leads III, V1. Will  suggest echocardiogram.      Assessment/Plan:  This is a routine wellness examination for Annye.  Patient Care Team: Rilla Baller, MD as PCP - General (Family Medicine) Dessa, Reyes ORN, MD (General Surgery) Melanee Annah BROCKS, MD as Consulting Physician (Oncology)  I have personally reviewed and noted the following in the patients chart:   Medical and  social history Use of alcohol, tobacco or illicit drugs  Current medications and supplements including opioid prescriptions. Functional ability and status Nutritional status Physical activity Advanced directives List of other physicians Hospitalizations, surgeries, and ER visits in previous 12 months Vitals Screenings to include cognitive, depression, and falls Referrals and appointments  Orders Placed This Encounter  Procedures   Pneumococcal conjugate vaccine 20-valent (Prevnar 20)   Flu vaccine HIGH DOSE PF(Fluzone Trivalent)   Lipid panel   Comprehensive metabolic panel with GFR   Phosphorus   VITAMIN D  25 Hydroxy (Vit-D Deficiency, Fractures)   Microalbumin / creatinine urine ratio   Parathyroid  hormone, intact (no Ca)   CBC with Differential/Platelet   Vitamin B12   EKG 12-Lead   ECHOCARDIOGRAM COMPLETE    Standing Status:   Future    Expiration Date:   04/27/2025    Where should this test be performed:   [facility]    Perflutren DEFINITY (image enhancing agent) should be administered unless hypersensitivity or allergy exist:   Administer Perflutren    Reason for exam-Echo:   Cardiomegaly  I51.7    Reason for exam-Echo:   Abnormal ECG  R94.31   Yorley was seen today for annual exam.  Welcome to Medicare preventive visit Assessment & Plan: I have personally reviewed the Medicare Annual Wellness questionnaire and have noted 1. The patient's medical and social history 2. Their use of alcohol, tobacco or illicit drugs 3. Their current medications and supplements 4. The patient's functional ability including ADL's, fall risks, home safety risks and hearing or visual impairment. Cognitive function has been assessed and addressed as indicated.  5. Diet and physical activity 6. Evidence for depression or mood disorders The patients weight, height, BMI have been recorded in the chart. I have made referrals, counseling and provided education to the patient based  on review of the above and I have provided the pt with a written personalized care plan for preventive services. Provider list updated.. See scanned questionairre as needed for further documentation. Reviewed preventative protocols and updated unless pt declined.   Orders: -     EKG 12-Lead  Advanced directives, counseling/discussion Assessment & Plan: Advanced directive - does not have. Would want Ubaldo husband or sister Ronal Gowers to be HCPOA. Packet provided today.    Stage 3 chronic kidney disease, unspecified whether stage 3a or 3b CKD (HCC) Overview: PTH, UA/Umicro WNL 10/2021  Assessment & Plan: Chronic, update kidney function  Orders: -     Comprehensive metabolic panel with GFR -     Phosphorus -     VITAMIN D  25 Hydroxy (Vit-D Deficiency, Fractures) -     Microalbumin / creatinine urine ratio -     Parathyroid  hormone, intact (no Ca) -     CBC with Differential/Platelet  Hyperlipidemia, unspecified hyperlipidemia type Assessment & Plan: Chronic on atorva - continue. Update FLP. The 10-year ASCVD risk score (Arnett DK, et al., 2019) is: 7.3%   Values used to calculate the score:     Age: 39 years     Clinically relevant sex: Female     Is Non-Hispanic African American: Yes  Diabetic: No     Tobacco smoker: No     Systolic Blood Pressure: 118 mmHg     Is BP treated: Yes     HDL Cholesterol: 43.1 mg/dL     Total Cholesterol: 163 mg/dL   Orders: -     Lipid panel -     Comprehensive metabolic panel with GFR  Vitamin D  deficiency Assessment & Plan: Update levels on vit D 1000 units daily.   Orders: -     VITAMIN D  25 Hydroxy (Vit-D Deficiency, Fractures)  High serum vitamin B12 -     Vitamin B12  Cardiomegaly Overview: Mild on CXR 01/2024  Assessment & Plan: Mild MC noted on imaging study.  Denies symptoms of CHF.  With this and abnormal EKG - recommend echocardiogram. She agrees.   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 Pending ortho appt next week for evaluation. She also notes she may be developing L thumb triggering.    DISH (diffuse idiopathic skeletal hyperostosis) Overview: Noted on CXR 01/2024  Assessment & Plan: Incidentally noted on prior CXR    Essential hypertension Assessment & Plan: Chronic, stable. Continue current regimen.  Orders: -     ECHOCARDIOGRAM COMPLETE; Future  Gastroesophageal reflux disease with esophagitis without hemorrhage Overview: Omeprazole  40mg  ineffective  Assessment & Plan: Continues pepcid . Avoiding PPI in CKD.    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  Obesity complicated by comorbidities of HTN, CKD, oteoarthritis, HLD   Need for pneumococcal vaccination -     Pneumococcal conjugate vaccine 20-valent  Encounter for immunization -     Flu vaccine HIGH DOSE PF(Fluzone Trivalent)  Other orders -     amLODIPine  Besylate; Take 1 tablet (5 mg total) by mouth daily.  Dispense: 90 tablet; Refill: 3 -     Atorvastatin  Calcium ; Take 1 tablet (20 mg total) by mouth daily.  Dispense: 90 tablet; Refill: 3 -     Gabapentin ; Take 1 capsule (300 mg total) by mouth 2 (two) times daily.  Dispense: 180 capsule; Refill: 3    In addition, I have reviewed and discussed with patient certain preventive protocols, quality metrics, and best practice recommendations. A written personalized care plan for preventive services as well as general preventive health recommendations were provided to patient.   Anton Blas, MD   04/27/2024   Return in about 6 months (around 10/26/2024)  for follow up visit.

## 2024-04-27 NOTE — Assessment & Plan Note (Addendum)
 Mild MC noted on imaging study.  Denies symptoms of CHF.  With this and abnormal EKG - recommend echocardiogram. She agrees.

## 2024-04-27 NOTE — Patient Instructions (Addendum)
 Prevnar-20 today and flu shot today  Advanced directive packet provided today  Labs today, urine test today.  EKG today  Hearing and vision screens.  Good to see you today  Call [facility] office to ask when your next colonoscopy is due.  If interested, check with pharmacy about new 2 shot shingles series (shingrix).  Return to see me in 6 months for follow up visit   Ms. Elridge,  Thank you for taking the time for your Medicare Wellness Visit. I appreciate your continued commitment to your health goals. Please review the care plan we discussed, and feel free to reach out if I can assist you further.  Please note that Annual Wellness Visits do not include a physical exam. Some assessments may be limited, especially if the visit was conducted virtually. If needed, we may recommend an in-person follow-up with your provider.  Ongoing Care Seeing your primary care provider every 3 to 6 months helps us  monitor your health and provide consistent, personalized care.   Referrals If a referral was made during today's visit and you haven't received any updates within two weeks, please contact the referred provider directly to check on the status.  Recommended Screenings:  Health Maintenance  Topic Date Due   Medicare Annual Wellness Visit  Never done   Pneumococcal Vaccine for age over 47 (1 of 2 - PCV) Never done   Zoster (Shingles) Vaccine (1 of 2) Never done   DTaP/Tdap/Td vaccine (2 - Td or Tdap) 12/11/2022   Colon Cancer Screening  07/15/2023   COVID-19 Vaccine (5 - 2025-26 season) 01/11/2024   Flu Shot  08/09/2024*   Breast Cancer Screening  08/23/2024   Osteoporosis screening with Bone Density Scan  Completed   Hepatitis C Screening  Completed   Meningitis B Vaccine  Aged Out  *Topic was postponed. The date shown is not the original due date.       04/27/2024   12:52 PM  Advanced Directives  Does Patient Have a Medical Advance Directive? No  Would patient like information on  creating a medical advance directive? Yes (ED - Information included in AVS)    Vision: Annual vision screenings are recommended for early detection of glaucoma, cataracts, and diabetic retinopathy. These exams can also reveal signs of chronic conditions such as diabetes and high blood pressure.  Dental: Annual dental screenings help detect early signs of oral cancer, gum disease, and other conditions linked to overall health, including heart disease and diabetes.  Please see the attached documents for additional preventive care recommendations.

## 2024-04-27 NOTE — Assessment & Plan Note (Signed)
Appreciate heme/onc care.  

## 2024-04-27 NOTE — Assessment & Plan Note (Signed)
 Incidentally noted on prior CXR

## 2024-04-27 NOTE — Assessment & Plan Note (Addendum)
 Encourage healthy diet and lifestyle to effect sustainable weight loss.  Obesity complicated by comorbidities of HTN, CKD, oteoarthritis, HLD

## 2024-04-27 NOTE — Assessment & Plan Note (Signed)
 L>R confirmed by NCS. Pending ortho appt next week for evaluation. She also notes she may be developing L thumb triggering.

## 2024-04-27 NOTE — Assessment & Plan Note (Addendum)
 Chronic, update kidney function.

## 2024-04-27 NOTE — Assessment & Plan Note (Signed)

## 2024-04-28 ENCOUNTER — Ambulatory Visit: Payer: Self-pay | Admitting: Family Medicine

## 2024-04-28 DIAGNOSIS — I517 Cardiomegaly: Secondary | ICD-10-CM

## 2024-04-28 LAB — COMPREHENSIVE METABOLIC PANEL WITH GFR
ALT: 13 U/L (ref 3–35)
AST: 15 U/L (ref 5–37)
Albumin: 4.2 g/dL (ref 3.5–5.2)
Alkaline Phosphatase: 66 U/L (ref 39–117)
BUN: 13 mg/dL (ref 6–23)
CO2: 32 meq/L (ref 19–32)
Calcium: 9.4 mg/dL (ref 8.4–10.5)
Chloride: 104 meq/L (ref 96–112)
Creatinine, Ser: 1.02 mg/dL (ref 0.40–1.20)
GFR: 56.95 mL/min — ABNORMAL LOW (ref >60.00)
Glucose, Bld: 81 mg/dL (ref 70–99)
Potassium: 4 meq/L (ref 3.5–5.1)
Sodium: 141 meq/L (ref 135–145)
Total Bilirubin: 0.4 mg/dL (ref 0.2–1.2)
Total Protein: 7.5 g/dL (ref 6.0–8.3)

## 2024-04-28 LAB — LIPID PANEL
Cholesterol: 168 mg/dL (ref 28–200)
HDL: 46.6 mg/dL (ref >39.00)
LDL Cholesterol: 101 mg/dL — ABNORMAL HIGH (ref 10–99)
NonHDL: 120.91
Total CHOL/HDL Ratio: 4
Triglycerides: 99 mg/dL (ref 10.0–149.0)
VLDL: 19.8 mg/dL (ref 0.0–40.0)

## 2024-04-28 LAB — PARATHYROID HORMONE, INTACT (NO CA): PTH: 28 pg/mL (ref 16–77)

## 2024-04-28 LAB — PHOSPHORUS: Phosphorus: 3.3 mg/dL (ref 2.3–4.6)

## 2024-04-28 LAB — CBC WITH DIFFERENTIAL/PLATELET
Basophils Absolute: 0 K/uL (ref 0.0–0.1)
Basophils Relative: 0.8 % (ref 0.0–3.0)
Eosinophils Absolute: 0.2 K/uL (ref 0.0–0.7)
Eosinophils Relative: 4.2 % (ref 0.0–5.0)
HCT: 38.3 % (ref 36.0–46.0)
Hemoglobin: 12.7 g/dL (ref 12.0–15.0)
Lymphocytes Relative: 36.3 % (ref 12.0–46.0)
Lymphs Abs: 1.7 K/uL (ref 0.7–4.0)
MCHC: 33.2 g/dL (ref 30.0–36.0)
MCV: 93.8 fl (ref 78.0–100.0)
Monocytes Absolute: 0.5 K/uL (ref 0.1–1.0)
Monocytes Relative: 9.8 % (ref 3.0–12.0)
Neutro Abs: 2.4 K/uL (ref 1.4–7.7)
Neutrophils Relative %: 48.9 % (ref 43.0–77.0)
Platelets: 216 K/uL (ref 150.0–400.0)
RBC: 4.08 Mil/uL (ref 3.87–5.11)
RDW: 13.4 % (ref 11.5–15.5)
WBC: 4.8 K/uL (ref 4.0–10.5)

## 2024-04-28 LAB — VITAMIN D 25 HYDROXY (VIT D DEFICIENCY, FRACTURES): VITD: 41.98 ng/mL (ref 30.00–100.00)

## 2024-04-28 LAB — VITAMIN B12: Vitamin B-12: 865 pg/mL (ref 211–911)

## 2024-05-06 ENCOUNTER — Other Ambulatory Visit: Payer: Self-pay | Admitting: Family Medicine

## 2024-05-06 DIAGNOSIS — M62838 Other muscle spasm: Secondary | ICD-10-CM

## 2024-05-21 ENCOUNTER — Other Ambulatory Visit: Payer: Self-pay | Admitting: Family Medicine

## 2024-05-21 DIAGNOSIS — K2100 Gastro-esophageal reflux disease with esophagitis, without bleeding: Secondary | ICD-10-CM

## 2024-05-30 ENCOUNTER — Ambulatory Visit: Attending: Family Medicine

## 2024-05-30 DIAGNOSIS — I10 Essential (primary) hypertension: Secondary | ICD-10-CM

## 2024-05-30 DIAGNOSIS — I517 Cardiomegaly: Secondary | ICD-10-CM | POA: Diagnosis not present

## 2024-05-30 LAB — ECHOCARDIOGRAM COMPLETE
AR max vel: 2.88 cm2
AV Area VTI: 3.01 cm2
AV Area mean vel: 2.29 cm2
AV Mean grad: 4 mmHg
AV Peak grad: 6.8 mmHg
Ao pk vel: 1.3 m/s
Area-P 1/2: 3.85 cm2
S' Lateral: 2.6 cm

## 2024-06-22 ENCOUNTER — Ambulatory Visit: Admitting: Family Medicine

## 2025-02-01 ENCOUNTER — Other Ambulatory Visit

## 2025-02-01 ENCOUNTER — Ambulatory Visit: Admitting: Oncology
